# Patient Record
Sex: Female | Born: 1950 | Race: White | Hispanic: No | Marital: Married | Smoking: Never smoker
Health system: Southern US, Community
[De-identification: ages and names within clinical notes are randomized; demographics above are authoritative.]

## PROBLEM LIST (undated history)

## (undated) DIAGNOSIS — N1330 Unspecified hydronephrosis: Secondary | ICD-10-CM

## (undated) DIAGNOSIS — E669 Obesity, unspecified: Secondary | ICD-10-CM

## (undated) DIAGNOSIS — I10 Essential (primary) hypertension: Secondary | ICD-10-CM

## (undated) DIAGNOSIS — E785 Hyperlipidemia, unspecified: Secondary | ICD-10-CM

## (undated) DIAGNOSIS — I700 Atherosclerosis of aorta: Secondary | ICD-10-CM

## (undated) DIAGNOSIS — R0609 Other forms of dyspnea: Secondary | ICD-10-CM

## (undated) DIAGNOSIS — Z8619 Personal history of other infectious and parasitic diseases: Secondary | ICD-10-CM

## (undated) DIAGNOSIS — N200 Calculus of kidney: Secondary | ICD-10-CM

## (undated) DIAGNOSIS — G40309 Generalized idiopathic epilepsy and epileptic syndromes, not intractable, without status epilepticus: Secondary | ICD-10-CM

## (undated) DIAGNOSIS — O99019 Anemia complicating pregnancy, unspecified trimester: Secondary | ICD-10-CM

## (undated) DIAGNOSIS — J449 Chronic obstructive pulmonary disease, unspecified: Secondary | ICD-10-CM

## (undated) DIAGNOSIS — E1169 Type 2 diabetes mellitus with other specified complication: Secondary | ICD-10-CM

## (undated) DIAGNOSIS — E119 Type 2 diabetes mellitus without complications: Secondary | ICD-10-CM

## (undated) DIAGNOSIS — G4730 Sleep apnea, unspecified: Secondary | ICD-10-CM

## (undated) DIAGNOSIS — M1611 Unilateral primary osteoarthritis, right hip: Secondary | ICD-10-CM

## (undated) DIAGNOSIS — E039 Hypothyroidism, unspecified: Secondary | ICD-10-CM

## (undated) DIAGNOSIS — R569 Unspecified convulsions: Secondary | ICD-10-CM

## (undated) DIAGNOSIS — E7800 Pure hypercholesterolemia, unspecified: Secondary | ICD-10-CM

## (undated) DIAGNOSIS — D649 Anemia, unspecified: Secondary | ICD-10-CM

## (undated) DIAGNOSIS — J45909 Unspecified asthma, uncomplicated: Secondary | ICD-10-CM

## (undated) DIAGNOSIS — R310 Gross hematuria: Secondary | ICD-10-CM

## (undated) DIAGNOSIS — IMO0001 Reserved for inherently not codable concepts without codable children: Secondary | ICD-10-CM

## (undated) DIAGNOSIS — N182 Chronic kidney disease, stage 2 (mild): Secondary | ICD-10-CM

## (undated) HISTORY — PX: OTHER SURGICAL HISTORY: SHX169

## (undated) HISTORY — DX: Unspecified hydronephrosis: N13.30

## (undated) HISTORY — PX: TUBAL LIGATION: SHX77

## (undated) HISTORY — PX: COLONOSCOPY: SHX174

## (undated) HISTORY — PX: SHOULDER ARTHROSCOPY DISTAL CLAVICLE EXCISION AND OPEN ROTATOR CUFF REPAIR: SHX2396

## (undated) HISTORY — DX: Gross hematuria: R31.0

---
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
## 2006-05-08 ENCOUNTER — Emergency Department: Payer: Self-pay | Admitting: Unknown Physician Specialty

## 2006-05-08 ENCOUNTER — Other Ambulatory Visit: Payer: Self-pay

## 2006-05-09 ENCOUNTER — Ambulatory Visit: Payer: Self-pay | Admitting: Unknown Physician Specialty

## 2006-08-26 ENCOUNTER — Ambulatory Visit: Payer: Self-pay | Admitting: Gastroenterology

## 2008-02-11 ENCOUNTER — Ambulatory Visit: Payer: Self-pay | Admitting: Gastroenterology

## 2008-02-22 ENCOUNTER — Ambulatory Visit: Payer: Self-pay | Admitting: Internal Medicine

## 2008-05-17 ENCOUNTER — Other Ambulatory Visit: Payer: Self-pay

## 2008-05-17 ENCOUNTER — Observation Stay: Payer: Self-pay | Admitting: Internal Medicine

## 2009-02-28 ENCOUNTER — Emergency Department: Payer: Self-pay | Admitting: Emergency Medicine

## 2009-04-07 ENCOUNTER — Ambulatory Visit: Payer: Self-pay | Admitting: Internal Medicine

## 2009-07-13 ENCOUNTER — Inpatient Hospital Stay: Payer: Self-pay | Admitting: Internal Medicine

## 2009-09-11 ENCOUNTER — Ambulatory Visit: Payer: Self-pay

## 2009-09-14 ENCOUNTER — Ambulatory Visit: Payer: Self-pay | Admitting: Unknown Physician Specialty

## 2009-09-15 ENCOUNTER — Ambulatory Visit: Payer: Self-pay | Admitting: Unknown Physician Specialty

## 2009-10-25 ENCOUNTER — Emergency Department: Payer: Self-pay | Admitting: Emergency Medicine

## 2010-04-17 ENCOUNTER — Ambulatory Visit: Payer: Self-pay | Admitting: Internal Medicine

## 2011-03-05 ENCOUNTER — Ambulatory Visit: Payer: Self-pay | Admitting: Gastroenterology

## 2011-04-22 ENCOUNTER — Ambulatory Visit: Payer: Self-pay | Admitting: Internal Medicine

## 2012-04-22 ENCOUNTER — Ambulatory Visit: Payer: Self-pay | Admitting: Internal Medicine

## 2012-09-19 ENCOUNTER — Emergency Department (HOSPITAL_COMMUNITY)
Admission: EM | Admit: 2012-09-19 | Discharge: 2012-09-19 | Disposition: A | Discharge status: Home / Self Care | Payer: 59 | Attending: Emergency Medicine | Admitting: Emergency Medicine

## 2012-09-19 ENCOUNTER — Emergency Department (HOSPITAL_COMMUNITY): Payer: 59

## 2012-09-19 ENCOUNTER — Encounter (HOSPITAL_COMMUNITY): Payer: Self-pay | Admitting: Emergency Medicine

## 2012-09-19 DIAGNOSIS — R55 Syncope and collapse: Secondary | ICD-10-CM | POA: Insufficient documentation

## 2012-09-19 DIAGNOSIS — R0602 Shortness of breath: Secondary | ICD-10-CM | POA: Insufficient documentation

## 2012-09-19 DIAGNOSIS — R000 Tachycardia, unspecified: Secondary | ICD-10-CM | POA: Insufficient documentation

## 2012-09-19 DIAGNOSIS — I10 Essential (primary) hypertension: Secondary | ICD-10-CM | POA: Insufficient documentation

## 2012-09-19 DIAGNOSIS — R42 Dizziness and giddiness: Secondary | ICD-10-CM | POA: Insufficient documentation

## 2012-09-19 DIAGNOSIS — E860 Dehydration: Secondary | ICD-10-CM | POA: Insufficient documentation

## 2012-09-19 DIAGNOSIS — N390 Urinary tract infection, site not specified: Secondary | ICD-10-CM | POA: Insufficient documentation

## 2012-09-19 HISTORY — DX: Essential (primary) hypertension: I10

## 2012-09-19 HISTORY — DX: Unspecified convulsions: R56.9

## 2012-09-19 HISTORY — DX: Chronic obstructive pulmonary disease, unspecified: J44.9

## 2012-09-19 LAB — URINALYSIS, ROUTINE W REFLEX MICROSCOPIC
Glucose, UA: 100 mg/dL — AB
Nitrite: NEGATIVE
Specific Gravity, Urine: 1.014 (ref 1.005–1.030)
pH: 5 (ref 5.0–8.0)

## 2012-09-19 LAB — BASIC METABOLIC PANEL
BUN: 13 mg/dL (ref 6–23)
CO2: 22 mEq/L (ref 19–32)
Chloride: 96 mEq/L (ref 96–112)
Creatinine, Ser: 0.76 mg/dL (ref 0.50–1.10)
GFR calc Af Amer: >90 mL/min (ref >90)
Potassium: 4 mEq/L (ref 3.5–5.1)

## 2012-09-19 LAB — CBC
HCT: 41 % (ref 36.0–46.0)
Hemoglobin: 13.9 g/dL (ref 12.0–15.0)
MCV: 89.5 fL (ref 78.0–100.0)
RBC: 4.58 MIL/uL (ref 3.87–5.11)
RDW: 14.5 % (ref 11.5–15.5)
WBC: 7.7 10*3/uL (ref 4.0–10.5)

## 2012-09-19 LAB — URINE MICROSCOPIC-ADD ON

## 2012-09-19 MED ORDER — SODIUM CHLORIDE 0.9 % IV BOLUS (SEPSIS)
1000.0000 mL | Freq: Once | INTRAVENOUS | Status: AC
  Administered 2012-09-19: 1000 mL via INTRAVENOUS

## 2012-09-19 MED ORDER — CEPHALEXIN 500 MG PO CAPS: 500.0000 mg | ORAL_CAPSULE | Freq: Four times a day (QID) | ORAL | Status: DC

## 2012-09-19 MED ORDER — CEPHALEXIN 250 MG PO CAPS
500.0000 mg | ORAL_CAPSULE | Freq: Once | ORAL | Status: AC
  Administered 2012-09-19: 500 mg via ORAL
  Filled 2012-09-19: qty 2

## 2012-09-19 NOTE — ED Notes (Signed)
Pt stating she feels "alot better now".

## 2012-09-19 NOTE — ED Notes (Signed)
Patient presented to ED via EMS after having 2 syncopal episodes at a marching band event outside. EMS states witnesses saw patient have loss of consciousness for approx 30 seconds each time.   EMS placed 20g ac to left ac with NS infusing.

## 2012-09-19 NOTE — ED Provider Notes (Signed)
History     CSN: [id number]  Arrival date & time 09/19/12  1813   First MD Initiated Contact with Patient 09/19/12 1822      Chief Complaint  Patient presents with  . Loss of Consciousness    (Consider location/radiation/quality/duration/timing/severity/associated sxs/prior treatment) HPI Comments: 61 y/o female presents to the ED by EMS with her husband after having two syncopal episodes while outside watching her daughter-in-law's marching band event. Patient states she was outside watching for about an hour and a half in the heat when she began feeling "woozy", lightheaded, and nauseated followed by the field becoming "gray". Husband states she then "passed out" into him while sitting up. She never fell or hit her head. Husband says her eyes were closed and she was out for about 15 minutes. EMS arrived and tried giving her some gatorade but she only drank a little. Once EMS put her on the stretcher she passed out again for about 5 minutes. When she woke up she felt lightheaded and nauseous. Currently she is lightheaded when standing but not nauseated. Admits to associated sob on exertion since arriving in ED. She is feeling fatigued and weak. Admits to not drinking as much water as she should. Denies chest pain, abdominal pain, dizziness, leg swelling. No recent medication changes. When she woke up this morning she felt completely fine.   The history is provided by the patient and the spouse.    Past Medical History  Diagnosis Date  . COPD (chronic obstructive pulmonary disease)   . Diabetes mellitus   . Seizures   . Hypertension     History reviewed. No pertinent past surgical history.  History reviewed. No pertinent family history.  History  Substance Use Topics  . Smoking status: Not on file  . Smokeless tobacco: Not on file  . Alcohol Use: No    OB History    Grav Para Term Preterm Abortions TAB SAB Ect Mult Living                  Review of Systems    Constitutional: Positive for chills and fatigue. Negative for fever, diaphoresis and appetite change.  HENT: Negative for neck pain and neck stiffness.   Eyes: Positive for visual disturbance.  Respiratory: Positive for shortness of breath.   Cardiovascular: Negative for chest pain and leg swelling.  Gastrointestinal: Positive for nausea. Negative for vomiting and abdominal pain.  Genitourinary: Negative for dysuria, urgency and frequency.  Musculoskeletal: Negative for back pain.  Skin: Positive for pallor.  Neurological: Positive for syncope, weakness and light-headedness. Negative for dizziness and speech difficulty.  Psychiatric/Behavioral: Negative for confusion. The patient is not nervous/anxious.     Allergies  Dilantin  Home Medications   Current Outpatient Rx  Name Route Sig Dispense Refill  . ATORVASTATIN CALCIUM 40 MG PO TABS Oral Take 40 mg by mouth daily.    Marland Kitchen DIVALPROEX SODIUM 250 MG PO TBEC Oral Take 250 mg by mouth 3 (three) times daily.    Marland Kitchen LISINOPRIL 10 MG PO TABS Oral Take 10 mg by mouth daily.      BP 164/57  Pulse 133  Temp 97.6 F (36.4 C) (Oral)  Resp 18  SpO2 95%  Physical Exam  Constitutional: She is oriented to person, place, and time. She appears well-developed and well-nourished. She is active. No distress.  HENT:  Head: Normocephalic and atraumatic.  Mouth/Throat: Uvula is midline, oropharynx is clear and moist and mucous membranes are normal.  Eyes: Conjunctivae  normal and EOM are normal. Pupils are equal, round, and reactive to light.  Neck: Normal range of motion. Neck supple. No Brudzinski's sign and no Kernig's sign noted.  Cardiovascular: Regular rhythm, normal heart sounds and intact distal pulses.  Tachycardia present.   No murmur heard.      No extremity edema  Pulmonary/Chest: Effort normal and breath sounds normal. She has no wheezes. She has no rhonchi.  Abdominal: Soft. Normal appearance and bowel sounds are normal. There is no  tenderness.  Musculoskeletal: Normal range of motion.  Neurological: She is alert and oriented to person, place, and time. She has normal strength. No cranial nerve deficit or sensory deficit. Gait (unsteady on feet- feels lightheaded) abnormal.       Point-to-point discrimination intact  Skin: Skin is warm, dry and intact. No rash noted. She is not diaphoretic. No pallor.  Psychiatric: She has a normal mood and affect. Her speech is normal and behavior is normal. Thought content normal.    ED Course  Procedures (including critical care time)   Labs Reviewed  CBC  BASIC METABOLIC PANEL  URINALYSIS, ROUTINE W REFLEX MICROSCOPIC  URINE CULTURE    Date: 09/19/2012  Rate: 119  Rhythm: sinus tachycardia  QRS Axis: normal  Intervals: normal  ST/T Wave abnormalities: nonspecific T wave changes borderline T wave abnormalities  Conduction Disutrbances:none  Narrative Interpretation: no stemi, baseline wander in lead III  Old EKG Reviewed: none available  Results for orders placed during the hospital encounter of 09/19/12  CBC      Component Value Range   WBC 7.7  4.0 - 10.5 K/uL   RBC 4.58  3.87 - 5.11 MIL/uL   Hemoglobin 13.9  12.0 - 15.0 g/dL   HCT 40.9  81.1 - 91.4 %   MCV 89.5  78.0 - 100.0 fL   MCH 30.3  26.0 - 34.0 pg   MCHC 33.9  30.0 - 36.0 g/dL   RDW 78.2  95.6 - 21.3 %   Platelets 185  150 - 400 K/uL  BASIC METABOLIC PANEL      Component Value Range   Sodium 132 (*) 135 - 145 mEq/L   Potassium 4.0  3.5 - 5.1 mEq/L   Chloride 96  96 - 112 mEq/L   CO2 22  19 - 32 mEq/L   Glucose, Bld 160 (*) 70 - 99 mg/dL   BUN 13  6 - 23 mg/dL   Creatinine, Ser 0.86  0.50 - 1.10 mg/dL   Calcium 9.2  8.4 - 57.8 mg/dL   GFR calc non Af Amer 89 (*) >90 mL/min   GFR calc Af Amer >90  >90 mL/min  URINALYSIS, ROUTINE W REFLEX MICROSCOPIC      Component Value Range   Color, Urine YELLOW  YELLOW   APPearance CLOUDY (*) CLEAR   Specific Gravity, Urine 1.014  1.005 - 1.030   pH 5.0  5.0  - 8.0   Glucose, UA 100 (*) NEGATIVE mg/dL   Hgb urine dipstick NEGATIVE  NEGATIVE   Bilirubin Urine SMALL (*) NEGATIVE   Ketones, ur 40 (*) NEGATIVE mg/dL   Protein, ur NEGATIVE  NEGATIVE mg/dL   Urobilinogen, UA 1.0  0.0 - 1.0 mg/dL   Nitrite NEGATIVE  NEGATIVE   Leukocytes, UA MODERATE (*) NEGATIVE  URINE MICROSCOPIC-ADD ON      Component Value Range   Squamous Epithelial / LPF FEW (*) RARE   WBC, UA 21-50  <3 WBC/hpf   Bacteria, UA FEW (*)  RARE   Casts HYALINE CASTS (*) NEGATIVE    Dg Chest 2 View  09/19/2012  *RADIOLOGY REPORT*  Clinical Data: Loss of consciousness.  Shortness of breath.  CHEST - 2 VIEW  Comparison: None.  Findings: Normal sized heart.  Mildly elevated left hemidiaphragm. Possible hiatal hernia.  Clear lungs.  Mild diffuse peribronchial thickening.  Mild thoracic and lumbar spine degenerative changes.  IMPRESSION:  1.  Mild bronchitic changes. 2.  Mildly elevated left hemidiaphragm. 3.  Probable hiatal hernia.   Original Report Authenticated By: Darrol Angel, M.D.      1. Syncope   2. Dehydration   3. Urinary tract infection       MDM  62 year old female with 2 episodes of syncope. Patient was dehydrated and orthostatic. Fluids were given and orthostatics are back to normal. She is feeling fine. Able to ambulate without difficulty. She has a urinary tract infection with Keflex has been started, and she'll be discharged with the rest of this antibiotic. Case discussed with Dr. Silverio Lay who also evaluated patient and agrees with plan of care.        Trevor Mace, PA-C 09/19/12 2216

## 2012-09-21 LAB — URINE CULTURE: Colony Count: 15000

## 2012-09-21 NOTE — ED Provider Notes (Signed)
Medical screening examination/treatment/procedure(s) were performed by non-physician practitioner and as supervising physician I was immediately available for consultation/collaboration.   Felcia Huebert H Pavel Gadd, MD 09/21/12 1800 

## 2013-03-05 ENCOUNTER — Emergency Department: Payer: Self-pay | Admitting: Emergency Medicine

## 2013-03-05 LAB — BASIC METABOLIC PANEL
BUN: 16 mg/dL (ref 7–18)
Calcium, Total: 8.1 mg/dL — ABNORMAL LOW (ref 8.5–10.1)
Chloride: 103 mmol/L (ref 98–107)
Co2: 21 mmol/L (ref 21–32)
EGFR (African American): >60
Osmolality: 281 (ref 275–301)
Potassium: 4.3 mmol/L (ref 3.5–5.1)
Sodium: 138 mmol/L (ref 136–145)

## 2013-03-05 LAB — URINALYSIS, COMPLETE
Bilirubin,UR: NEGATIVE
Glucose,UR: 50 mg/dL (ref 0–75)
Ph: 5 (ref 4.5–8.0)
Protein: 100
RBC,UR: 4567 /HPF (ref 0–5)
Specific Gravity: 1.026 (ref 1.003–1.030)
WBC UR: 69 /HPF (ref 0–5)

## 2013-03-05 LAB — CBC
MCH: 30.6 pg (ref 26.0–34.0)
MCHC: 34.3 g/dL (ref 32.0–36.0)
MCV: 89 fL (ref 80–100)
RBC: 4.51 10*6/uL (ref 3.80–5.20)
WBC: 9.1 10*3/uL (ref 3.6–11.0)

## 2013-03-16 ENCOUNTER — Ambulatory Visit: Payer: Self-pay

## 2013-05-25 ENCOUNTER — Ambulatory Visit: Payer: Self-pay | Admitting: Internal Medicine

## 2014-05-15 DIAGNOSIS — E785 Hyperlipidemia, unspecified: Secondary | ICD-10-CM | POA: Insufficient documentation

## 2014-05-15 DIAGNOSIS — J45909 Unspecified asthma, uncomplicated: Secondary | ICD-10-CM | POA: Insufficient documentation

## 2014-05-15 DIAGNOSIS — N182 Chronic kidney disease, stage 2 (mild): Secondary | ICD-10-CM

## 2014-05-15 DIAGNOSIS — G40909 Epilepsy, unspecified, not intractable, without status epilepticus: Secondary | ICD-10-CM | POA: Insufficient documentation

## 2014-05-15 DIAGNOSIS — E1122 Type 2 diabetes mellitus with diabetic chronic kidney disease: Secondary | ICD-10-CM | POA: Insufficient documentation

## 2014-05-15 DIAGNOSIS — R569 Unspecified convulsions: Secondary | ICD-10-CM | POA: Insufficient documentation

## 2014-05-15 DIAGNOSIS — E119 Type 2 diabetes mellitus without complications: Secondary | ICD-10-CM | POA: Insufficient documentation

## 2014-05-15 DIAGNOSIS — I10 Essential (primary) hypertension: Secondary | ICD-10-CM | POA: Insufficient documentation

## 2014-06-16 ENCOUNTER — Ambulatory Visit: Payer: Self-pay | Admitting: Internal Medicine

## 2014-06-23 ENCOUNTER — Ambulatory Visit: Payer: Self-pay | Admitting: Internal Medicine

## 2014-09-25 DIAGNOSIS — IMO0002 Reserved for concepts with insufficient information to code with codable children: Secondary | ICD-10-CM | POA: Insufficient documentation

## 2014-09-25 DIAGNOSIS — Z6835 Body mass index (BMI) 35.0-35.9, adult: Secondary | ICD-10-CM

## 2014-09-27 DIAGNOSIS — E7800 Pure hypercholesterolemia, unspecified: Secondary | ICD-10-CM | POA: Insufficient documentation

## 2014-12-04 ENCOUNTER — Emergency Department: Payer: Self-pay | Admitting: Emergency Medicine

## 2014-12-04 LAB — BASIC METABOLIC PANEL
ANION GAP: 7 (ref 7–16)
BUN: 13 mg/dL (ref 7–18)
CO2: 27 mmol/L (ref 21–32)
CREATININE: 0.85 mg/dL (ref 0.60–1.30)
Calcium, Total: 8.7 mg/dL (ref 8.5–10.1)
Chloride: 104 mmol/L (ref 98–107)
Glucose: 208 mg/dL — ABNORMAL HIGH (ref 65–99)
Osmolality: 282 (ref 275–301)
Potassium: 3.7 mmol/L (ref 3.5–5.1)
Sodium: 138 mmol/L (ref 136–145)

## 2014-12-04 LAB — CBC
HCT: 42.9 % (ref 35.0–47.0)
HGB: 13.6 g/dL (ref 12.0–16.0)
MCH: 29.3 pg (ref 26.0–34.0)
MCHC: 31.8 g/dL — AB (ref 32.0–36.0)
MCV: 92 fL (ref 80–100)
Platelet: 200 10*3/uL (ref 150–440)
RBC: 4.65 10*6/uL (ref 3.80–5.20)
RDW: 14.5 % (ref 11.5–14.5)
WBC: 6.4 10*3/uL (ref 3.6–11.0)

## 2014-12-04 LAB — TROPONIN I: Troponin-I: <0.02 ng/mL

## 2015-03-11 ENCOUNTER — Emergency Department: Payer: Self-pay | Admitting: Emergency Medicine

## 2015-03-11 HISTORY — PX: CYSTOSCOPY W/ URETERAL STENT PLACEMENT: SHX1429

## 2015-03-11 LAB — URINALYSIS, COMPLETE
Bacteria: NONE SEEN
Bilirubin,UR: NEGATIVE
Glucose,UR: >=500 mg/dL (ref 0–75)
Leukocyte Esterase: NEGATIVE
Nitrite: NEGATIVE
PH: 5 (ref 4.5–8.0)
Protein: NEGATIVE
RBC,UR: 208 /HPF (ref 0–5)
Specific Gravity: 1.028 (ref 1.003–1.030)
Squamous Epithelial: NONE SEEN
WBC UR: 2 /HPF (ref 0–5)

## 2015-03-11 LAB — CBC WITH DIFFERENTIAL/PLATELET
BASOS PCT: 0.4 %
Basophil #: 0 10*3/uL (ref 0.0–0.1)
EOS ABS: 0 10*3/uL (ref 0.0–0.7)
Eosinophil %: 0 %
HCT: 43.3 % (ref 35.0–47.0)
HGB: 14.3 g/dL (ref 12.0–16.0)
LYMPHS ABS: 1.3 10*3/uL (ref 1.0–3.6)
Lymphocyte %: 10.5 %
MCH: 29.9 pg (ref 26.0–34.0)
MCHC: 33 g/dL (ref 32.0–36.0)
MCV: 91 fL (ref 80–100)
Monocyte #: 0.6 x10 3/mm (ref 0.2–0.9)
Monocyte %: 5.1 %
Neutrophil #: 10.3 10*3/uL — ABNORMAL HIGH (ref 1.4–6.5)
Neutrophil %: 84 %
Platelet: 224 10*3/uL (ref 150–440)
RBC: 4.79 10*6/uL (ref 3.80–5.20)
RDW: 14.7 % — ABNORMAL HIGH (ref 11.5–14.5)
WBC: 12.2 10*3/uL — ABNORMAL HIGH (ref 3.6–11.0)

## 2015-03-11 LAB — BASIC METABOLIC PANEL
Anion Gap: 9 (ref 7–16)
BUN: 17 mg/dL
CHLORIDE: 98 mmol/L — AB
CREATININE: 0.89 mg/dL
Calcium, Total: 9 mg/dL
Co2: 28 mmol/L
EGFR (African American): >60
EGFR (Non-African Amer.): >60
Glucose: 254 mg/dL — ABNORMAL HIGH
POTASSIUM: 4.5 mmol/L
SODIUM: 135 mmol/L

## 2015-03-13 ENCOUNTER — Other Ambulatory Visit: Payer: Self-pay | Admitting: Urology

## 2015-03-13 NOTE — Patient Instructions (Addendum)
Crystal Foley  03/13/2015   Your procedure is scheduled on: 03/17/15   Report to [facility] Main  Entrance and follow signs to               [name] at 9:45 AM.   Call this number if you have problems the morning of surgery [phone]   Remember:  Do not eat food or drink liquids :After Midnight.    Take these medicines the morning of surgery with A SIP OF WATER: LIPITOR / DEPAKOTE / MAY USE ALBUTEROL INHALER / MAY USE PERCOCET ,PYRIDIUM, PHENERGAN IF NEEDED                               You may not have any metal on your body including hair pins and              piercings  Do not wear jewelry, make-up, lotions, powders or perfumes.             Do not wear nail polish.  Do not shave  48 hours prior to surgery.              Men may shave face and neck.   Do not bring valuables to the hospital. [name].  Contacts, dentures or bridgework may not be worn into surgery.  Leave suitcase in the car. After surgery it may be brought to your room.     Patients discharged the day of surgery will not be allowed to drive home.  Name and phone number of your driver:  Special Instructions: N/A              Please read over the following fact sheets you were given: _____________________________________________________________________                                                     [name]  Before surgery, you can play an important role.  Because skin is not sterile, your skin needs to be as free of germs as possible.  You can reduce the number of germs on your skin by washing with CHG (chlorahexidine gluconate) soap before surgery.  CHG is an antiseptic cleaner which kills germs and bonds with the skin to continue killing germs even after washing. Please DO NOT use if you have an allergy to CHG or antibacterial soaps.  If your skin becomes reddened/irritated stop using the CHG  and inform your nurse when you arrive at Short Stay. Do not shave (including legs and underarms) for at least 48 hours prior to the first CHG shower.  You may shave your face. Please follow these instructions carefully:   1.  Shower with CHG Soap the night before surgery and the  morning of Surgery.   2.  If you choose to wash your hair, wash your hair first as usual with your  normal  Shampoo.   3.  After you shampoo, rinse your hair and body thoroughly to remove the  shampoo.  4.  Use CHG as you would any other liquid soap.  You can apply chg directly  to the skin and wash . Gently wash with scrungie or clean wascloth    5.  Apply the CHG Soap to your body ONLY FROM THE NECK DOWN.   Do not use on open                           Wound or open sores. Avoid contact with eyes, ears mouth and genitals (private parts).                        Genitals (private parts) with your normal soap.              6.  Wash thoroughly, paying special attention to the area where your surgery  will be performed.   7.  Thoroughly rinse your body with warm water from the neck down.   8.  DO NOT shower/wash with your normal soap after using and rinsing off  the CHG Soap .                9.  Pat yourself dry with a clean towel.             10.  Wear clean pajamas.             11.  Place clean sheets on your bed the night of your first shower and do not  sleep with pets.  Day of Surgery : Do not apply any lotions/deodorants the morning of surgery.  Please wear clean clothes to the hospital/surgery center.  FAILURE TO FOLLOW THESE INSTRUCTIONS MAY RESULT IN THE CANCELLATION OF YOUR SURGERY    PATIENT SIGNATURE_________________________________  ______________________________________________________________________

## 2015-03-13 NOTE — Progress Notes (Signed)
Called requested  orders be released in Epic surgery 03-17-15 pre op 03-14-15 Thanks

## 2015-03-14 ENCOUNTER — Encounter (HOSPITAL_COMMUNITY)
Admission: RE | Admit: 2015-03-14 | Discharge: 2015-03-14 | Disposition: A | Discharge status: Home / Self Care | Payer: 59 | Source: Ambulatory Visit | Attending: Urology | Admitting: Urology

## 2015-03-14 ENCOUNTER — Encounter (HOSPITAL_COMMUNITY): Payer: Self-pay

## 2015-03-14 DIAGNOSIS — E780 Pure hypercholesterolemia: Secondary | ICD-10-CM | POA: Diagnosis not present

## 2015-03-14 DIAGNOSIS — J449 Chronic obstructive pulmonary disease, unspecified: Secondary | ICD-10-CM | POA: Diagnosis not present

## 2015-03-14 DIAGNOSIS — E119 Type 2 diabetes mellitus without complications: Secondary | ICD-10-CM | POA: Diagnosis not present

## 2015-03-14 DIAGNOSIS — N201 Calculus of ureter: Secondary | ICD-10-CM | POA: Diagnosis present

## 2015-03-14 DIAGNOSIS — I10 Essential (primary) hypertension: Secondary | ICD-10-CM | POA: Diagnosis not present

## 2015-03-14 DIAGNOSIS — R569 Unspecified convulsions: Secondary | ICD-10-CM | POA: Diagnosis not present

## 2015-03-14 HISTORY — DX: Reserved for inherently not codable concepts without codable children: IMO0001

## 2015-03-14 HISTORY — DX: Calculus of kidney: N20.0

## 2015-03-14 LAB — BASIC METABOLIC PANEL
Anion gap: 9 (ref 5–15)
BUN: 11 mg/dL (ref 6–23)
CO2: 28 mmol/L (ref 19–32)
Calcium: 8.5 mg/dL (ref 8.4–10.5)
Chloride: 101 mmol/L (ref 96–112)
Creatinine, Ser: 0.69 mg/dL (ref 0.50–1.10)
GFR calc Af Amer: >90 mL/min (ref >90)
GFR calc non Af Amer: >90 mL/min (ref >90)
GLUCOSE: 179 mg/dL — AB (ref 70–99)
Potassium: 3.7 mmol/L (ref 3.5–5.1)
Sodium: 138 mmol/L (ref 135–145)

## 2015-03-14 LAB — CBC
HEMATOCRIT: 39.9 % (ref 36.0–46.0)
Hemoglobin: 13.1 g/dL (ref 12.0–15.0)
MCH: 29.9 pg (ref 26.0–34.0)
MCHC: 32.8 g/dL (ref 30.0–36.0)
MCV: 91.1 fL (ref 78.0–100.0)
PLATELETS: 203 10*3/uL (ref 150–400)
RBC: 4.38 MIL/uL (ref 3.87–5.11)
RDW: 13.5 % (ref 11.5–15.5)
WBC: 6.5 10*3/uL (ref 4.0–10.5)

## 2015-03-16 NOTE — H&P (Signed)
NAMEBITANIA, SHANKLAND                ACCOUNT NO.:  [id number]  MEDICAL RECORD NO.:  [id number]  LOCATION:                                 FACILITY:  PHYSICIAN:  Marshall Cork. Jeffie Pollock, M.D.    DATE OF BIRTH:  [birth year]  DATE OF ADMISSION:  03/17/2015 DATE OF DISCHARGE:                             HISTORY & PHYSICAL   CHIEF COMPLAINT:  Left flank pain.  HISTORY:  Ms. Zavaleta is a 64 year old white female who I saw at [facility] on March 12, 2015, for a 9 mm left UPJ stone with intractable pain and vomiting.  She underwent placement of a left ureteral stent and has elected ureteroscopy for subsequent therapy.  We elected to bring her to [facility] since I would be able to get her in to the OR sooner than if she had done it at [facility].  Her past urologic history is significant for 3 prior stones which she passed. She has had a prior UTI and she has had a prior bladder tack.  REVIEW OF SYSTEMS:  On her prior admission, she had left flank pain, nausea, vomiting, and increased urinary incontinence with frequency, but no fever or chills.  She denied shortness of breath, chest pain, headache, swelling of the legs, and was otherwise without complaints on a 12-point review of systems.  PAST MEDICAL HISTORY:  Pertinent for allergies to Dilantin.  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  She denies coronary artery disease.  MEDICATIONS:  Current medications include divalproex 250 mg, 3 in the morning, 2 in the evening; metformin 500 mg daily; losartan 50 mg daily; glimepiride 2 mg daily; atorvastatin 40 mg daily.  PAST SURGICAL HISTORY:  Significant for the bladder tack, close reduction of the right arm, tubal ligation, 2 vaginal deliveries, and cysto with left stent insertion.  FAMILY HISTORY:  Pertinent for coronary artery disease, emphysema, and brain cancer.  SOCIAL HISTORY:   Negative tobacco or alcohol.  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  She is married.  PHYSICAL EXAMINATION:  VITAL SIGNS:  Her temp is 98, pulse 97, respirations 16, blood pressure is 162/86. GENERAL:  She is a well-developed, somewhat obese, white female, in no acute distress.  Alert and oriented x3. HEAD AND FACE:  Normocephalic, atraumatic. NECK:  Supple without thyromegaly or bruits. LUNGS:  Clear.  Normal effort. HEART:  Regular rate and rhythm. ABDOMEN:  Soft, obese with left upper quadrant and left CVA tenderness. No mass, hepatosplenomegaly, or hernias were noted. EXTREMITIES:  Full range of motion without edema. SKIN:  Warm and dry. NEUROLOGIC:  She has no focal defects. PSYCHIATRIC:  She has normal mood and affect.  IMPRESSION:  A 9 mm left proximal/ureteropelvic junction stone with recent stent placement.  PLAN:  She is to undergo left ureteroscopy, lasertripsy, and stone removal with possible stent placement.     Marshall Cork. Jeffie Pollock, M.D.     JJW/MEDQ  D:  03/15/2015  T:  03/16/2015  Job:  [id number]

## 2015-03-17 ENCOUNTER — Ambulatory Visit (HOSPITAL_COMMUNITY): Payer: 59 | Admitting: Anesthesiology

## 2015-03-17 ENCOUNTER — Encounter (HOSPITAL_COMMUNITY): Payer: Self-pay | Admitting: *Deleted

## 2015-03-17 ENCOUNTER — Encounter (HOSPITAL_COMMUNITY)
Admission: RE | Disposition: A | Discharge status: Home / Self Care | Payer: Self-pay | Source: Ambulatory Visit | Attending: Urology

## 2015-03-17 ENCOUNTER — Ambulatory Visit (HOSPITAL_COMMUNITY)
Admission: RE | Admit: 2015-03-17 | Discharge: 2015-03-17 | Disposition: A | Discharge status: Home / Self Care | Payer: 59 | Source: Ambulatory Visit | Attending: Urology | Admitting: Urology

## 2015-03-17 DIAGNOSIS — N201 Calculus of ureter: Secondary | ICD-10-CM | POA: Insufficient documentation

## 2015-03-17 DIAGNOSIS — I10 Essential (primary) hypertension: Secondary | ICD-10-CM | POA: Insufficient documentation

## 2015-03-17 DIAGNOSIS — J449 Chronic obstructive pulmonary disease, unspecified: Secondary | ICD-10-CM | POA: Insufficient documentation

## 2015-03-17 DIAGNOSIS — E780 Pure hypercholesterolemia: Secondary | ICD-10-CM | POA: Insufficient documentation

## 2015-03-17 DIAGNOSIS — R569 Unspecified convulsions: Secondary | ICD-10-CM | POA: Insufficient documentation

## 2015-03-17 DIAGNOSIS — E119 Type 2 diabetes mellitus without complications: Secondary | ICD-10-CM | POA: Insufficient documentation

## 2015-03-17 HISTORY — PX: CYSTOSCOPY WITH RETROGRADE PYELOGRAM, URETEROSCOPY AND STENT PLACEMENT: SHX5789

## 2015-03-17 LAB — GLUCOSE, CAPILLARY
Glucose-Capillary: 198 mg/dL — ABNORMAL HIGH (ref 70–99)
Glucose-Capillary: 192 mg/dL — ABNORMAL HIGH (ref 70–99)

## 2015-03-17 SURGERY — CYSTOURETEROSCOPY, WITH RETROGRADE PYELOGRAM AND STENT INSERTION
Anesthesia: General | Laterality: Left

## 2015-03-17 MED ORDER — MIDAZOLAM HCL 2 MG/2ML IJ SOLN
INTRAMUSCULAR | Status: AC
  Filled 2015-03-17: qty 2

## 2015-03-17 MED ORDER — PROPOFOL 10 MG/ML IV BOLUS
INTRAVENOUS | Status: AC
  Filled 2015-03-17: qty 20

## 2015-03-17 MED ORDER — PROPOFOL 10 MG/ML IV BOLUS
INTRAVENOUS | Status: DC | PRN
  Administered 2015-03-17: 180 mg via INTRAVENOUS

## 2015-03-17 MED ORDER — ONDANSETRON HCL 4 MG/2ML IJ SOLN
INTRAMUSCULAR | Status: DC | PRN
  Administered 2015-03-17: 4 mg via INTRAVENOUS

## 2015-03-17 MED ORDER — ACETAMINOPHEN 325 MG PO TABS: 650.0000 mg | ORAL_TABLET | ORAL | Status: DC | PRN

## 2015-03-17 MED ORDER — FENTANYL CITRATE 0.05 MG/ML IJ SOLN: 25.0000 ug | INTRAMUSCULAR | Status: DC | PRN

## 2015-03-17 MED ORDER — FENTANYL CITRATE 0.05 MG/ML IJ SOLN
INTRAMUSCULAR | Status: DC | PRN
  Administered 2015-03-17: 50 ug via INTRAVENOUS

## 2015-03-17 MED ORDER — SODIUM CHLORIDE 0.9 % IR SOLN
Status: DC | PRN
  Administered 2015-03-17: 4000 mL

## 2015-03-17 MED ORDER — SODIUM CHLORIDE 0.9 % IV SOLN: 250.0000 mL | INTRAVENOUS | Status: DC | PRN

## 2015-03-17 MED ORDER — MEPERIDINE HCL 50 MG/ML IJ SOLN: 6.2500 mg | INTRAMUSCULAR | Status: DC | PRN

## 2015-03-17 MED ORDER — LACTATED RINGERS IV SOLN: INTRAVENOUS | Status: DC

## 2015-03-17 MED ORDER — CIPROFLOXACIN IN D5W 400 MG/200ML IV SOLN: 400.0000 mg | INTRAVENOUS | Status: DC

## 2015-03-17 MED ORDER — MIDAZOLAM HCL 5 MG/5ML IJ SOLN
INTRAMUSCULAR | Status: DC | PRN
  Administered 2015-03-17: 2 mg via INTRAVENOUS

## 2015-03-17 MED ORDER — SODIUM CHLORIDE 0.9 % IJ SOLN: 3.0000 mL | INTRAMUSCULAR | Status: DC | PRN

## 2015-03-17 MED ORDER — CIPROFLOXACIN IN D5W 400 MG/200ML IV SOLN
INTRAVENOUS | Status: AC
  Filled 2015-03-17: qty 200

## 2015-03-17 MED ORDER — OXYCODONE HCL 5 MG PO TABS
5.0000 mg | ORAL_TABLET | ORAL | Status: DC | PRN
  Administered 2015-03-17: 5 mg via ORAL
  Filled 2015-03-17: qty 1

## 2015-03-17 MED ORDER — CIPROFLOXACIN IN D5W 400 MG/200ML IV SOLN
INTRAVENOUS | Status: DC | PRN
  Administered 2015-03-17: 400 mg via INTRAVENOUS

## 2015-03-17 MED ORDER — DEXAMETHASONE SODIUM PHOSPHATE 4 MG/ML IJ SOLN
INTRAMUSCULAR | Status: DC | PRN
  Administered 2015-03-17: 10 mg via INTRAVENOUS

## 2015-03-17 MED ORDER — PROMETHAZINE HCL 25 MG/ML IJ SOLN: 6.2500 mg | INTRAMUSCULAR | Status: DC | PRN

## 2015-03-17 MED ORDER — ACETAMINOPHEN 650 MG RE SUPP
650.0000 mg | RECTAL | Status: DC | PRN
  Filled 2015-03-17: qty 1

## 2015-03-17 MED ORDER — SODIUM CHLORIDE 0.9 % IJ SOLN: 3.0000 mL | Freq: Two times a day (BID) | INTRAMUSCULAR | Status: DC

## 2015-03-17 MED ORDER — LACTATED RINGERS IV SOLN
INTRAVENOUS | Status: DC
  Administered 2015-03-17: 1000 mL via INTRAVENOUS

## 2015-03-17 MED ORDER — FENTANYL CITRATE 0.05 MG/ML IJ SOLN
INTRAMUSCULAR | Status: AC
  Filled 2015-03-17: qty 2

## 2015-03-17 MED ORDER — IOHEXOL 300 MG/ML  SOLN
INTRAMUSCULAR | Status: DC | PRN
  Administered 2015-03-17: 1 mL

## 2015-03-17 MED ORDER — LIDOCAINE HCL (CARDIAC) 20 MG/ML IV SOLN
INTRAVENOUS | Status: DC | PRN
  Administered 2015-03-17: 100 mg via INTRAVENOUS

## 2015-03-17 SURGICAL SUPPLY — 17 items
BAG URO CATCHER STRL LF (DRAPE) ×2 IMPLANT
CATH URET 5FR 28IN OPEN ENDED (CATHETERS) ×2 IMPLANT
CLOTH BEACON ORANGE TIMEOUT ST (SAFETY) ×2 IMPLANT
EXTRACTOR STONE NITINOL NGAGE (UROLOGICAL SUPPLIES) ×2 IMPLANT
FIBER LASER FLEXIVA 1000 (UROLOGICAL SUPPLIES) IMPLANT
FIBER LASER FLEXIVA 200 (UROLOGICAL SUPPLIES) IMPLANT
FIBER LASER FLEXIVA 365 (UROLOGICAL SUPPLIES) IMPLANT
FIBER LASER FLEXIVA 550 (UROLOGICAL SUPPLIES) IMPLANT
FIBER LASER TRAC TIP (UROLOGICAL SUPPLIES) IMPLANT
GLOVE SURG SS PI 8.0 STRL IVOR (GLOVE) IMPLANT
GOWN STRL REUS W/TWL XL LVL3 (GOWN DISPOSABLE) ×2 IMPLANT
GUIDEWIRE STR DUAL SENSOR (WIRE) IMPLANT
MANIFOLD NEPTUNE II (INSTRUMENTS) ×2 IMPLANT
PACK CYSTO (CUSTOM PROCEDURE TRAY) ×2 IMPLANT
SHEATH ACCESS URETERAL 38CM (SHEATH) ×2 IMPLANT
SHIELD EYE BINOCULAR (MISCELLANEOUS) IMPLANT
TUBING CONNECTING 10 (TUBING) ×2 IMPLANT

## 2015-03-17 NOTE — Interval H&P Note (Signed)
History and Physical Interval Note:  She has done well with the stent and presents for definitive stone treatment.   03/17/2015 11:48 AM  Crystal Foley  has presented today for surgery, with the diagnosis of LEFT PROXIMAL STONE  The various methods of treatment have been discussed with the patient and family. After consideration of risks, benefits and other options for treatment, the patient has consented to  Procedure(s): LEFT URETEROSCOPY WITH LASER AND STENT PLACEMENT (Left) HOLMIUM LASER APPLICATION (Left) as a surgical intervention .  The patient's history has been reviewed, patient examined, no change in status, stable for surgery.  I have reviewed the patient's chart and labs.  Questions were answered to the patient's satisfaction.     [name] J

## 2015-03-17 NOTE — Anesthesia Postprocedure Evaluation (Signed)
  Anesthesia Post-op Note  Patient: Crystal Foley  Procedure(s) Performed: Procedure(s) (LRB): CYSTOSCOPY, STENT REMOVAL, LEFT URETEROSCOPY  WITH STONE REMOVAL WITH BASKET (Left)  Patient Location: PACU  Anesthesia Type: General  Level of Consciousness: awake and alert   Airway and Oxygen Therapy: Patient Spontanous Breathing  Post-op Pain: mild  Post-op Assessment: Post-op Vital signs reviewed, Patient's Cardiovascular Status Stable, Respiratory Function Stable, Patent Airway and No signs of Nausea or vomiting  Last Vitals:  Filed Vitals:   03/17/15 1336  BP: 135/62  Pulse: 79  Temp:   Resp: 16    Post-op Vital Signs: stable   Complications: No apparent anesthesia complications

## 2015-03-17 NOTE — Discharge Instructions (Signed)

## 2015-03-17 NOTE — Anesthesia Preprocedure Evaluation (Signed)
Anesthesia Evaluation  Patient identified by MRN, date of birth, ID band Patient awake    Reviewed: Allergy & Precautions, NPO status , Patient's Chart, lab work & pertinent test results  Airway Mallampati: II  TM Distance: >3 FB Neck ROM: Full    Dental no notable dental hx.    Pulmonary COPD breath sounds clear to auscultation  Pulmonary exam normal       Cardiovascular hypertension, Pt. on medications negative cardio ROS  Rhythm:Regular Rate:Normal     Neuro/Psych Seizures -, Well Controlled,  negative psych ROS   GI/Hepatic negative GI ROS, Neg liver ROS,   Endo/Other  diabetes  Renal/GU negative Renal ROS  negative genitourinary   Musculoskeletal negative musculoskeletal ROS (+)   Abdominal   Peds negative pediatric ROS (+)  Hematology negative hematology ROS (+)   Anesthesia Other Findings   Reproductive/Obstetrics negative OB ROS                             Anesthesia Physical Anesthesia Plan  ASA: II  Anesthesia Plan: General   Post-op Pain Management:    Induction: Intravenous  Airway Management Planned: LMA  Additional Equipment:   Intra-op Plan:   Post-operative Plan: Extubation in OR  Informed Consent: I have reviewed the patients History and Physical, chart, labs and discussed the procedure including the risks, benefits and alternatives for the proposed anesthesia with the patient or authorized representative who has indicated his/her understanding and acceptance.   Dental advisory given  Plan Discussed with: CRNA  Anesthesia Plan Comments:         Anesthesia Quick Evaluation

## 2015-03-17 NOTE — Anesthesia Procedure Notes (Signed)
Procedure Name: LMA Insertion Date/Time: 03/17/2015 12:03 PM Performed by: Riki Sheer Pre-anesthesia Checklist: Patient identified, Emergency Drugs available, Suction available, Patient being monitored and Timeout performed Patient Re-evaluated:Patient Re-evaluated prior to inductionOxygen Delivery Method: Circle system utilized Preoxygenation: Pre-oxygenation with 100% oxygen Intubation Type: IV induction Ventilation: Mask ventilation without difficulty LMA: LMA with gastric port inserted LMA Size: 4.0 Number of attempts: 1 Placement Confirmation: positive ETCO2,  CO2 detector and breath sounds checked- equal and bilateral Tube secured with: Tape Dental Injury: Teeth and Oropharynx as per pre-operative assessment

## 2015-03-17 NOTE — Transfer of Care (Signed)
Immediate Anesthesia Transfer of Care Note  Patient: Crystal Foley  Procedure(s) Performed: Procedure(s): CYSTOSCOPY, STENT REMOVAL, LEFT URETEROSCOPY  WITH STONE REMOVAL WITH BASKET (Left)  Patient Location: PACU  Anesthesia Type:General  Level of Consciousness: sedated  Airway & Oxygen Therapy: Patient Spontanous Breathing and Patient connected to face mask oxygen  Post-op Assessment: Report given to RN and Post -op Vital signs reviewed and stable  Post vital signs: Reviewed and stable  Last Vitals:  Filed Vitals:   03/17/15 0938  BP: 177/87  Pulse: 96  Temp: 37 C  Resp: 18    Complications: No apparent anesthesia complications

## 2015-03-17 NOTE — Progress Notes (Signed)
repport to Santiago Glad

## 2015-03-17 NOTE — Brief Op Note (Signed)
03/17/2015  12:34 PM  PATIENT:  Crystal Foley  64 y.o. female  PRE-OPERATIVE DIAGNOSIS:  LEFT PROXIMAL STONE  POST-OPERATIVE DIAGNOSIS:  LEFT PROXIMAL STONE  PROCEDURE:  Procedure(s): CYSTOSCOPY, STENT REMOVAL, LEFT URETEROSCOPY  WITH STONE REMOVAL WITH BASKET (Left)  SURGEON:  Surgeon(s) and Role:    * Irine Seal, MD - Primary  PHYSICIAN ASSISTANT:   ASSISTANTS: none   ANESTHESIA:   general  EBL:  Total I/O In: 500 [I.V.:500] Out: -   BLOOD ADMINISTERED:none  DRAINS: none   LOCAL MEDICATIONS USED:  NONE  SPECIMEN:  Source of Specimen:  stone  DISPOSITION OF SPECIMEN:  to family to bring to office  COUNTS:  YES  TOURNIQUET:  * No tourniquets in log *  DICTATION: .Other Dictation: Dictation Number [id number]  PLAN OF CARE: Discharge to home after PACU  PATIENT DISPOSITION:  PACU - hemodynamically stable.   Delay start of Pharmacological VTE agent (>24hrs) due to surgical blood loss or risk of bleeding: not applicable

## 2015-03-18 NOTE — Op Note (Signed)
NAMEALVINE, MOSTAFA NO.:  [id number]  MEDICAL RECORD NO.:  [id number]  LOCATION:  [facility]                         FACILITY:  [facility]  PHYSICIAN:  Marshall Cork. Jeffie Pollock, M.D.    DATE OF BIRTH:  Oct 30, 1951  DATE OF PROCEDURE:  03/17/2015 DATE OF DISCHARGE:  03/17/2015                              OPERATIVE REPORT   PROCEDURES: 1. Cystoscopy with removal of left double-J stent. 2. Left ureteroscopic stone extraction.  PREOPERATIVE DIAGNOSIS:  Left proximal ureteral stone.  POSTOPERATIVE DIAGNOSIS:  Left proximal ureteral stone.  SURGEON:  Marshall Cork. Jeffie Pollock, M.D.  ANESTHESIA:  General.  SPECIMEN:  Stone.  DRAINS:  None.  BLOOD LOSS:  None.  COMPLICATIONS:  None.  INDICATIONS:  Crystal Foley is a 64 year old white female, who I saw last weekend with a 9-mm left proximal ureteral stone with pain and obstruction.  She had a stent placed at [facility] and returns today for ureteroscopic stone extraction.  FINDINGS OF PROCEDURE:  She was given Cipro.  She was taken to the operating room where general anesthetic was induced.  She was placed in lithotomy position.  Her perineum and genitalia were prepped with Betadine solution.  She was draped in usual sterile fashion.  She had been fitted with PAS hose as well.  Cystoscopy was performed using a 23-French scope and 30-degree lens. The stent was visualized and was grasped with a grasping forceps and pulled the urethral meatus.  A guidewire was then advanced to the kidney and the stent was removed. A 38-cm digital access sheath was then passed first the inner core followed by the assembled sheath up to the level of the stone, which could be seen on fluoroscopy.  The inner core and wire were then removed and the dual-lumen Wolf digital flexible ureteroscope was then advanced through the sheath.  The stone pushed back into the renal pelvis, but it was easily identified. It was grasped with a NGage basket and  removed intact through the stent dilated ureter.  The ureteroscope sheath was removed along with the stone at that time.  The ureteroscope was then reinserted per urethra and passed up the ureteral meatus into the ureter.  Inspection revealed a few small clots, but no ureteral injury.  It was felt that stent replacement was not indicated.  At this point, the ureteroscope was removed.  The cystoscope was reinserted and the bladder was drained.  The patient was taken down from lithotomy position.  Her anesthetic was reversed.  She was moved to recovery room in stable condition.  The stone was given to her husband to bring to the office for analysis.  There were no complications.     Marshall Cork. Jeffie Pollock, M.D.     JJW/MEDQ  D:  03/17/2015  T:  03/18/2015  Job:  [id number]

## 2015-03-20 ENCOUNTER — Encounter (HOSPITAL_COMMUNITY): Payer: Self-pay | Admitting: Urology

## 2015-04-16 NOTE — Op Note (Signed)
PATIENT NAME:  Crystal Foley, Crystal Foley MR#:  [id number] DATE OF BIRTH:  1951-12-15  DATE OF PROCEDURE:  03/12/2015  PROCEDURE:  Cystoscopy with insertion of left double J stent.  PREOPERATIVE DIAGNOSIS:  Left ureteropelvic junction stone.  POSTOPERATIVE DIAGNOSIS:  Left ureteropelvic junction stone.  SURGEON:  Dr. Irine Seal.  ANESTHESIA:  General.  SPECIMEN:  None.  DRAINS:  6 French x 24 cm Contour double J stent.  ESTIMATED BLOOD LOSS:  None.  COMPLICATIONS:  None.  INDICATIONS:  Crystal Foley is a 64 year old white female who presented to the Emergency Room on March 26 with severe left flank pain.  CT revealed a 9 mm proximal/ureteropelvic junction stone on the left.  She did not get relief from pain medicine and antiemetics and it was felt that stenting was indicated.    FINDINGS AT PROCEDURE:  She was given Cipro.  She was taken to the operating room table where general anesthetic was induced.  She was placed in lithotomy position.  Her perineum and genitalia were prepped with Betadine solution.  She was draped in the usual sterile fashion.    Cystoscopy was performed using a 22 [place] scope and 12 degree lens.  Examination revealed a normal urethra.  The bladder wall was smooth and pale without tumor, stones, or inflammation.  The ureteral orifices were unremarkable, but the ureteral orifice on the left was effluxing bloody urine.  The Sensor guidewire was then passed to the kidney under fluoroscopic guidance.  It passed easily by the stone.  A 6 French, 24 cm, Contour double J stent was then advanced over the wire without difficulty.  The wire was removed leaving a good coil in the kidney and a good coil in the bladder.  The bladder was drained.  Cystoscope was removed.  The patient was taken out of lithotomy position, anesthetic was reversed, and she was moved to the recovery room in stable condition with no complications.    ____________________________ Marshall Cork. Jeffie Pollock,  MD jjw:sp D: 03/12/2015 01:41:31 ET T: 03/12/2015 11:48:49 ET JOB#: [id number]  cc: Marshall Cork. Jeffie Pollock, MD, <Dictator> Marshall Cork [facility] MD ELECTRONICALLY SIGNED 04/01/2015 7:15

## 2015-04-16 NOTE — Consult Note (Signed)
PATIENT NAME:  Crystal Foley, Crystal Foley MR#:  [id number] DATE OF BIRTH:  05-Mar-1951  DATE OF CONSULTATION:  03/12/2015  REFERRING PHYSICIAN:   CONSULTING PHYSICIAN:  Marshall Cork. Jeffie Pollock, MD  This is a patient of Yetta Numbers. Karma Greaser, M.D.  CHIEF COMPLAINT: Left flank pain.   HISTORY OF PRESENT ILLNESS: I was asked to see [name] in consultation for a 9 mm left UPJ stone. She had the onset at 2 p.m. on March 26 of severe left flank pain associated with nausea and vomiting. She had some blood when she wiped after voiding and had frequency, increasing incontinence, but no fever or chills. She was seen in the Emergency Room where she was noted to have a 9 mm left UPJ stone. She has a history of 3 prior stones that she passed. She had a urinary tract infection with the first one, but none since. Her only other urologic history is a bladder tack. She has received morphine and Dilaudid in the ER with antiemetics, but has persistent pain and nausea.   REVIEW OF SYSTEMS: She had some shortness of breath in December with an exacerbation of COPD, but is currently without shortness of breath. She has no chest pain, headache, swelling of the legs, and is otherwise without complaint on the 12-point review of systems.   ALLERGIES: DILANTIN.  CURRENT MEDICATIONS: Include divalproex 250 mg 3 in the morning and 2 in the evening, metformin 500 mg daily, losartan 50 mg daily, glimepiride 2 mg daily,  40 mg daily.   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 She denies coronary artery disease.   PAST SURGICAL HISTORY: Pertinent for bladder tack. She has had a closed reduction of the right arm. Remotely she had a tubal ligation. She has had 2 vaginal deliveries.   FAMILY HISTORY: Pertinent for coronary artery disease, emphysema, and brain cancer.   SOCIAL HISTORY: Negative for tobacco or alcohol. 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 She is married.   PHYSICAL EXAMINATION:  VITAL SIGNS: She weighs 200 pounds. She is 5 feet 4 inches tall. She is reported as afebrile, but her vital signs are not documented.  GENERAL: She is a well developed, well nourished, somewhat ill-appearing, white female, in no acute distress. Alert and oriented x 3. HEENT: Head and face normocephalic, atraumatic.  NECK: Supple without thyromegaly or bruit.  LUNGS: Clear with normal effort.  HEART: Regular rate and rhythm.   ABDOMEN: Soft, moderately obese with marked left upper quadrant and left CVA tenderness. No mass, hepatosplenomegaly or hernias noted.  GENITOURINARY AND RECTAL: Examination was not performed.  EXTREMITIES: Have full range of motion without edema.  SKIN: Warm and dry.  NEUROLOGIC: She has no focal deficits.  PSYCHIATRIC: She has normal mood and affect.   LABORATORY DATA: I have reviewed her laboratory work. Her chemistries are remarkable only for a slightly low chloride of 98. Her CBC is significant for a white count of 12.2 with 10.3 neutrophils. Hemoglobin is 14.3, platelet count 224,000.   Her urinalysis has 208 red cells per high power field, 2 white cells, no bacteria, and is nitrite negative.   I reviewed her CT films and report. She has a 9 mm left proximal/UPJ stone with hydronephrosis and a renal cyst. There are small stones in the kidney.   I discussed her case with Dr. Karma Greaser regarding the need for urgent intervention.   IMPRESSION: A 9 mm left proximal/ureteropelvic junction stone  with intractable pain and nausea.   PLAN: After reviewing the options, we are going to proceed with cystoscopy and placement of a left ureteral stent. Tonight I reviewed the risks of bleeding, infection, ureteral injury, need for secondary procedures such as ESWL or ureteroscopy, thrombotic events, and anesthetic complications. She is agreeable to proceed and will be done as an outpatient this evening.      ____________________________ Marshall Cork. Jeffie Pollock, MD jjw:JT D: 03/12/2015 01:01:52 ET T: 03/12/2015 08:39:04 ET JOB#: [id number]  cc: Marshall Cork. Jeffie Pollock, MD, <Dictator> Marshall Cork [facility] MD ELECTRONICALLY SIGNED 04/01/2015 7:14

## 2015-05-26 ENCOUNTER — Telehealth: Payer: Self-pay | Admitting: *Deleted

## 2015-05-26 ENCOUNTER — Other Ambulatory Visit: Payer: Self-pay | Admitting: *Deleted

## 2015-05-26 ENCOUNTER — Encounter: Payer: Self-pay | Admitting: *Deleted

## 2015-05-26 NOTE — Telephone Encounter (Signed)
Patient had called asking for results of her 24 hour urine test. Could not find results called and received results and contacted patient back to set up an appointment to come in and discuss results. Patient transferred to [facility] to set up appt. Patient ok with plan.

## 2015-05-30 ENCOUNTER — Encounter: Payer: Self-pay | Admitting: Urology

## 2015-05-30 ENCOUNTER — Ambulatory Visit
Admission: RE | Admit: 2015-05-30 | Discharge: 2015-05-30 | Disposition: A | Discharge status: Home / Self Care | Payer: 59 | Source: Ambulatory Visit | Attending: Urology | Admitting: Urology

## 2015-05-30 ENCOUNTER — Ambulatory Visit (INDEPENDENT_AMBULATORY_CARE_PROVIDER_SITE_OTHER): Payer: 59 | Admitting: Urology

## 2015-05-30 VITALS — BP 172/83 | HR 89 | Resp 18 | Ht 64.0 in | Wt 202.7 lb

## 2015-05-30 DIAGNOSIS — N200 Calculus of kidney: Secondary | ICD-10-CM | POA: Diagnosis not present

## 2015-05-30 DIAGNOSIS — N132 Hydronephrosis with renal and ureteral calculous obstruction: Secondary | ICD-10-CM | POA: Diagnosis not present

## 2015-06-01 ENCOUNTER — Telehealth: Payer: Self-pay | Admitting: Urology

## 2015-06-01 DIAGNOSIS — N1330 Unspecified hydronephrosis: Secondary | ICD-10-CM

## 2015-06-01 DIAGNOSIS — N200 Calculus of kidney: Secondary | ICD-10-CM | POA: Insufficient documentation

## 2015-06-01 NOTE — Telephone Encounter (Signed)
After looking through her records, I noticed she did not have a RUS after her stone extraction in April.  She will need to have a renal ultrasound to make sure that the hydronephrosis has resolved.

## 2015-06-01 NOTE — Progress Notes (Signed)
05/30/2015 3:12 PM   Crystal Foley [birth year] [id number]  Referring provider: [name], MD [facility] [place], [place] [address]  Chief Complaint  Patient presents with  . Follow-up    24 hr urine    HPI: Crystal Foley is a 63 y/o white female who presents today to discuss the results of her 24 hour urine.    Previous history, Patient was seen at [facility] ED on 03/12/2015 for a 9 mm left UPJ stone with intractable pain and vomiting.  Dr. Jeffie Pollock placed a left ureteral stent at that time and she followed up at [facility] for definitive treatment of the stone.  Dr. Jeffie Pollock performed a left ureteroscopic stone extraction and cystoscopy with removal of left double-J stent on 03/17/2015.    She presented to our office on 04/25/2015 for follow up and saw Dr. Elnoria Howard.  He wanted her to have a 24 hour urine and a KUB in 8 months.  She is currently not experiencing any flank pain or hematuria.  She is also not experiencing any fevers, chills, nausea, vomiting or suprapubic pain.    24 hour urine results indicate a low urinary volume and increased oxalates.     PMH: Past Medical History  Diagnosis Date  . COPD (chronic obstructive pulmonary disease)   . Diabetes mellitus   . Hypertension   . Shortness of breath dyspnea     with exertion  . Seizures     last seizure 7-8 yrs ago  . Bilateral kidney stones   . Hydronephrosis   . Hematuria, gross     Surgical History: Past Surgical History  Procedure Laterality Date  . Cystoscopy w/ ureteral stent placement  03/11/15  . Tubal ligation    . Cystoscopy with retrograde pyelogram, ureteroscopy and stent placement Left 03/17/2015    Procedure: CYSTOSCOPY, STENT REMOVAL, LEFT URETEROSCOPY  WITH STONE REMOVAL WITH BASKET;  Surgeon: Irine Seal, MD;  Location: [facility];  Service: Urology;  Laterality: Left;    Home Medications:    Medication List       This list is accurate as of: 05/30/15 11:59 PM.  Always  use your most recent med list.               albuterol 108 (90 BASE) MCG/ACT inhaler  Commonly known as:  PROVENTIL HFA;VENTOLIN HFA  Inhale 1 puff into the lungs every 6 (six) hours as needed for wheezing or shortness of breath.     atorvastatin 40 MG tablet  Commonly known as:  LIPITOR  Take 40 mg by mouth daily.     divalproex 250 MG 24 hr tablet  Commonly known as:  DEPAKOTE ER  Take 500-750 mg by mouth 2 (two) times daily.     glimepiride 2 MG tablet  Commonly known as:  AMARYL  Take 2 mg by mouth daily with breakfast.     losartan 50 MG tablet  Commonly known as:  COZAAR  Take 50 mg by mouth every morning.     metFORMIN 500 MG 24 hr tablet  Commonly known as:  GLUCOPHAGE-XR  Take 1,000 mg by mouth daily with breakfast.     promethazine 25 MG tablet  Commonly known as:  PHENERGAN  Take 25 mg by mouth every 6 (six) hours as needed for nausea or vomiting.        Allergies:  Allergies  Allergen Reactions  . Dilantin [Phenytoin Sodium Extended] Swelling    Family  History: Family History  Problem Relation Age of Onset  . Coronary artery disease Sister   . Brain cancer Father   . Emphysema Mother     Social History:  reports that she has never smoked. She does not have any smokeless tobacco history on file. She reports that she does not drink alcohol or use illicit drugs.  ROS: Urological Symptom Review  Patient is experiencing the following symptoms: Kidney stones   Review of Systems  Gastrointestinal (upper)  : Negative for upper GI symptoms  Gastrointestinal (lower) : Negative for lower GI symptoms  Constitutional : Negative for symptoms  Skin: Negative for skin symptoms  Eyes: Negative for eye symptoms  Ear/Nose/Throat : Negative for Ear/Nose/Throat symptoms  Hematologic/Lymphatic: Negative for Hematologic/Lymphatic symptoms  Cardiovascular : Negative for cardiovascular symptoms  Respiratory : Negative for respiratory  symptoms  Endocrine: Negative for endocrine symptoms  Musculoskeletal: Negative for musculoskeletal symptoms  Neurological: Negative for neurological symptoms  Psychologic: Negative for psychiatric symptoms   Physical Exam: BP 172/83 mmHg  Pulse 89  Resp 18  Ht 5\' 4"  (1.626 m)  Wt 202 lb 11.2 oz (91.944 kg)  BMI 34.78 kg/m2    Laboratory Data: Lab Results  Component Value Date   WBC 6.5 03/14/2015   HGB 13.1 03/14/2015   HCT 39.9 03/14/2015   MCV 91.1 03/14/2015   PLT 203 03/14/2015    Lab Results  Component Value Date   CREATININE 0.69 03/14/2015    No results found for: PSA  No results found for: TESTOSTERONE  No results found for: HGBA1C  Urinalysis    Component Value Date/Time   COLORURINE YELLOW 09/19/2012 1920   APPEARANCEUR CLOUDY* 09/19/2012 1920   LABSPEC 1.014 09/19/2012 1920   PHURINE 5.0 09/19/2012 1920   GLUCOSEU 100* 09/19/2012 1920   HGBUR NEGATIVE 09/19/2012 1920   BILIRUBINUR SMALL* 09/19/2012 1920   KETONESUR 40* 09/19/2012 1920   PROTEINUR NEGATIVE 09/19/2012 1920   UROBILINOGEN 1.0 09/19/2012 1920   NITRITE NEGATIVE 09/19/2012 1920   LEUKOCYTESUR MODERATE* 09/19/2012 1920    Pertinent Imaging: REASON FOR EXAM:    severe flank pain, eval for stones COMMENTS: PROCEDURE:     CT  - CT ABDOMEN /PELVIS WO (STONE)  - Mar 11 2015 10:18PM CLINICAL DATA:  Initial evaluation for ear acute severe flank pain. EXAM: CT ABDOMEN AND PELVIS WITHOUT CONTRAST TECHNIQUE: Multidetector CT imaging of the abdomen and pelvis was performed following the standard protocol without IV contrast. COMPARISON:  Prior study from 03/16/2013 as well as 03/05/2013 FINDINGS: Mild dependent atelectasis present within the visualized lung bases. Linear opacity within the lingula also consistent with atelectasis. No pleural or pericardial effusion. The liver demonstrates a normal unenhanced appearance. Gallbladder within normal limits. No biliary dilatation.  Spleen, adrenal glands, and pancreas within normal limits. Multiple parapelvic cyst present within the right kidney. There is a single nonobstructive 4 mm calculus within the lower pole the right kidney. No obstructive stone seen along the course of the right renal collecting system. There is no right-sided hydronephrosis or hydroureter. On the left, there is an obstructive 9 mm calculus at the left UPJ with secondary severe left-sided hydronephrosis. Mild left perinephric fat stranding. Additional nonobstructive calculi measuring approximately 4 mm present within the upper pole left kidney. Multiple left renal cyst noted. Distal to the left UPJ stone, no obstructive calculi seen within the left renal collecting system. Small hiatal hernia noted. Stomach otherwise unremarkable. No evidence for bowel obstruction. Appendix well visualized in the right  lower quadrant and is of normal caliber and appearance without associated inflammatory changes to suggest acute appendicitis. No abnormal wall thickening or inflammatory fat stranding seen elsewhere about the bowels. Mild diverticulosis present within the descending colon without evidence for diverticulitis. Bladder within normal limits.  Uterus and ovaries are normal. No free air or fluid. No pathologically enlarged intra-abdominal pelvic lymph nodes identified. Mild to moderate atheromatous plaque present within the intra-abdominal aorta. No aneurysm. No acute osseous abnormality. No worrisome lytic or blastic osseous lesion. On the left, there is an obstructive IMPRESSION: 1. Obstructive 9 mm stone near the left UPJ with secondary severe left hydronephrosis. 2. Additional bilateral nonobstructive nephrolithiasis as above. 3. No other acute intra-abdominal pelvic process. 4. Mild colonic diverticulosis without acute diverticulitis. Electronically Signed By: Jeannine Boga M.D.  CLINICAL DATA: Kidney stones.  EXAM: ABDOMEN  - 1 VIEW  COMPARISON: CT 03/11/2015  FINDINGS: Tiny left upper renal pole calyceal stone again noted. Previously identified left UPJ stone not visualized. Pelvic calcifications cyst with vascular calcifications. No bowel distention. No acute bony abnormality. Tiny density noted left iliac wing, most likely a bone island. No other sclerotic lesions noted.  IMPRESSION: Findings consistent with passage of left UPJ stone. Left upper pole small caliceal stone again noted.   Electronically Signed  By: Marcello Moores Register  On: 05/31/2015 08:11   Assessment & Plan:    1. Nephrolithiasis-  Patient underwent a left ureteral stone extraction with Dr. Jeffie Pollock in April 2016.  She underwent a 24 hour urine for a metabolic work up.  She was found to how a low urine volume and high oxalate.  She was given the brochures for increasing your fluid intake and lowering her oxalate in her diet from [employer].  She has also started a web site , "[email or web address]"    - Abdomen 1 view (KUB)  2. Hydronephrosis-  Patient will need to have a RUS to make sure the hydronephrosis has resolved.    No Follow-up on file.  [name], [facility] [address] [place], [place] [address] [phone]

## 2015-06-02 ENCOUNTER — Telehealth: Payer: Self-pay | Admitting: *Deleted

## 2015-06-02 NOTE — Telephone Encounter (Signed)
Spoke with patient and gave message that she needs a RUS  To make sure that the hydronephrosis has resolved. Patient ok with plan.

## 2015-06-02 NOTE — Telephone Encounter (Signed)
Pt called and was wondering about her x ray results. Pt states that she got a call from [facility] about needing a U/S and she didn't tell her about her results. Pt requesting a call back [phone]

## 2015-06-05 NOTE — Telephone Encounter (Signed)
CLINICAL DATA: Kidney stones.  EXAM: ABDOMEN - 1 VIEW  COMPARISON: CT 03/11/2015 KUB results: FINDINGS: Tiny left upper renal pole calyceal stone again noted. Previously identified left UPJ stone not visualized. Pelvic calcifications cyst with vascular calcifications. No bowel distention. No acute bony abnormality. Tiny density noted left iliac wing, most likely a bone island. No other sclerotic lesions noted.  IMPRESSION: Findings consistent with passage of left UPJ stone. Left upper pole small caliceal stone again noted.   Electronically Signed  By: Marcello Moores Register  Dr. Elnoria Howard did not order a RUS, but she needs one after the type of procedure she had to make sure her kidneys are not swollen.  I cannot tell this from an x-ray.   On: 05/31/2015 08:11

## 2015-06-05 NOTE — Telephone Encounter (Signed)
Pt stated she has a RUS next week. Cw,lpn

## 2015-06-05 NOTE — Telephone Encounter (Signed)
Pt would like results of x-ray. Please advise. Cw,lpn

## 2015-06-14 ENCOUNTER — Ambulatory Visit
Admission: RE | Admit: 2015-06-14 | Discharge: 2015-06-14 | Disposition: A | Discharge status: Home / Self Care | Payer: 59 | Source: Ambulatory Visit | Attending: Urology | Admitting: Urology

## 2015-06-14 DIAGNOSIS — N200 Calculus of kidney: Secondary | ICD-10-CM | POA: Diagnosis present

## 2015-06-14 DIAGNOSIS — N1330 Unspecified hydronephrosis: Secondary | ICD-10-CM | POA: Diagnosis present

## 2015-06-16 ENCOUNTER — Telehealth: Payer: Self-pay

## 2015-06-16 NOTE — Telephone Encounter (Signed)
-----   Message from Nori Riis, PA-C sent at 06/15/2015 10:26 PM EDT ----- RUS does not demonstrate any hydronephrosis.

## 2015-06-16 NOTE — Telephone Encounter (Signed)
Spoke with pt in reference to RUS results. Cw,lpn

## 2015-06-21 ENCOUNTER — Other Ambulatory Visit: Payer: Self-pay | Admitting: Urology

## 2015-09-19 DIAGNOSIS — Z0000 Encounter for general adult medical examination without abnormal findings: Secondary | ICD-10-CM | POA: Insufficient documentation

## 2016-02-02 ENCOUNTER — Encounter: Payer: Self-pay | Admitting: Sports Medicine

## 2016-02-02 ENCOUNTER — Ambulatory Visit (INDEPENDENT_AMBULATORY_CARE_PROVIDER_SITE_OTHER): Payer: BLUE CROSS/BLUE SHIELD | Admitting: Sports Medicine

## 2016-02-02 DIAGNOSIS — M79675 Pain in left toe(s): Secondary | ICD-10-CM | POA: Diagnosis not present

## 2016-02-02 DIAGNOSIS — L600 Ingrowing nail: Secondary | ICD-10-CM

## 2016-02-02 DIAGNOSIS — E119 Type 2 diabetes mellitus without complications: Secondary | ICD-10-CM | POA: Diagnosis not present

## 2016-02-02 DIAGNOSIS — M79674 Pain in right toe(s): Secondary | ICD-10-CM

## 2016-02-02 NOTE — Progress Notes (Signed)
Patient ID: [name], female   DOB: 1951-11-19, 65 y.o.   MRN: [id number] Subjective: TAYLA FRESCO is a 65 y.o.  Diabetic female patient presents to office today complaining of a painful incurvated, swollen medial nail border of the 1st toe on both feet. This has been present for 1 month after getting a pedicure. Patient has treated this by trying to protect the toes; states that the side of her nails hurt with pressure from shoes and with the bed sheets touching the toenails. Patient denies fever/chills/nausea/vomitting/any other related constitutional symptoms at this time.  FBS 103 this am  Patient Active Problem List   Diagnosis Date Noted  . Encounter for general adult medical examination without abnormal findings 09/19/2015  . Calculus of kidney 06/01/2015  . Hypercholesterolemia without hypertriglyceridemia 09/27/2014  . Pure hypercholesterolemia 09/27/2014  . Adult BMI 30+ 09/25/2014  . Morbid obesity ([place]) 09/25/2014  . Airway hyperreactivity 05/15/2014  . Benign hypertension 05/15/2014  . HLD (hyperlipidemia) 05/15/2014  . Seizure ([place]) 05/15/2014  . Diabetes mellitus, type 2 ([place]) 05/15/2014  . Type 2 diabetes mellitus ([place]) 05/15/2014   Current Outpatient Prescriptions on File Prior to Visit  Medication Sig Dispense Refill  . albuterol (PROVENTIL HFA;VENTOLIN HFA) 108 (90 BASE) MCG/ACT inhaler Inhale 1 puff into the lungs every 6 (six) hours as needed for wheezing or shortness of breath.    Marland Kitchen atorvastatin (LIPITOR) 40 MG tablet Take 40 mg by mouth daily.    . divalproex (DEPAKOTE ER) 250 MG 24 hr tablet Take 500-750 mg by mouth 2 (two) times daily.    Marland Kitchen glimepiride (AMARYL) 2 MG tablet Take 2 mg by mouth daily with breakfast.    . losartan (COZAAR) 50 MG tablet Take 50 mg by mouth every morning.    . metFORMIN (GLUCOPHAGE-XR) 500 MG 24 hr tablet Take 1,000 mg by mouth daily with breakfast.    . promethazine (PHENERGAN) 25 MG tablet Take 25 mg by mouth every 6 (six)  hours as needed for nausea or vomiting.     No current facility-administered medications on file prior to visit.   Allergies  Allergen Reactions  . Dilantin [Phenytoin Sodium Extended] Swelling  . Phenytoin Swelling   Objective:  General: Well developed, nourished, in no acute distress, alert and oriented x3   Dermatology: Skin is warm, dry and supple bilateral. Bilateral hallux nail appears to be  severely incurvated with hyperkeratosis formation at the distal aspects of  the medial nail border. (-) Erythema. (+) Edema. (-) serosanguous  drainage present. The remaining nails appear unremarkable at this time. There are no open sores, lesions or other signs of infection present.  Vascular: Dorsalis Pedis artery and Posterior Tibial artery pedal pulses are 2/4 bilateral with immedate capillary fill time. Pedal hair growth present. No lower extremity edema.   Neruologic: Grossly intact via light touch bilateral. Protective and epicritic intact bilateral.   Musculoskeletal: Tenderness to palpation of the medial nail folds, bilateral hallux. +Bunion deformity bilateral. Muscular strength within normal limits in all groups bilateral.   Assesement and Plan: Problem List Items Addressed This Visit    None    Visit Diagnoses    Ingrown nail    -  Primary    Bilateral hallux medial margin    Toe pain, bilateral        Diabetes mellitus without complication (HCC)        Relevant Medications    aspirin EC 81 MG tablet      -Discussed  treatment alternatives and plan of care; Explained permanent/temporary nail avulsion and post procedure course to patient. Patient opt for PNA.  - After a verbal consent, injected 3 ml of a 50:50 mixture of 2% plain  lidocaine and 0.5% plain marcaine in a normal hallux block fashion. Next, a  betadine prep was performed. Anesthesia was tested and found to be appropriate.  The offending medial bilateral hallux nail borders were then incised from the  hyponychium to the epinychium. The offending nail borders were removed and cleared from the field. The areas were curretted for any remaining nail or spicules. Phenol application performed and the areas were then flushed with alcohol and dressed with antibiotic cream and a dry sterile dressing. -Patient was instructed to leave the dressing intact for today and begin soaking  in a weak solution of betadine and water tomorrow. Patient was instructed to  soak for 15 minutes each day and apply neosporin and a gauze or bandaid dressing each day. -Patient was instructed to monitor the toes for signs of infection and return to office if toe becomes red, hot or swollen. -Patient is to return in 1 week for follow up care or sooner if problems arise.  Landis Martins, DPM

## 2016-02-02 NOTE — Progress Notes (Deleted)
   Subjective:    Patient ID: Crystal Foley, female    DOB: 1951-05-24, 65 y.o.   MRN: [id number]  HPI    Review of Systems  Endocrine:       Diabetic   All other systems reviewed and are negative.      Objective:   Physical Exam        Assessment & Plan:

## 2016-02-02 NOTE — Patient Instructions (Signed)

## 2016-02-09 ENCOUNTER — Encounter: Payer: Self-pay | Admitting: Sports Medicine

## 2016-02-09 ENCOUNTER — Ambulatory Visit (INDEPENDENT_AMBULATORY_CARE_PROVIDER_SITE_OTHER): Payer: BLUE CROSS/BLUE SHIELD | Admitting: Sports Medicine

## 2016-02-09 DIAGNOSIS — M79675 Pain in left toe(s): Secondary | ICD-10-CM

## 2016-02-09 DIAGNOSIS — L600 Ingrowing nail: Secondary | ICD-10-CM

## 2016-02-09 DIAGNOSIS — M79674 Pain in right toe(s): Secondary | ICD-10-CM

## 2016-02-09 DIAGNOSIS — B351 Tinea unguium: Secondary | ICD-10-CM

## 2016-02-09 DIAGNOSIS — E119 Type 2 diabetes mellitus without complications: Secondary | ICD-10-CM | POA: Diagnosis not present

## 2016-02-09 NOTE — Patient Instructions (Signed)
Soak 1/4 cup of epsom salt with warm water once daily and cover toes with neosporin and band-aid for about 1-2 weeks or until areas have healed

## 2016-02-09 NOTE — Progress Notes (Signed)
Patient ID: Crystal Foley, female   DOB: November 20, 1951, 65 y.o.   MRN: [id number] Subjective: Crystal Foley is a 65 y.o. diabetic female patient returns to office today for follow up evaluation after having Right & Left Hallux medial permanent nail avulsions performed on 02-02-16. Patient has been soaking using betadine and applying topical antibiotic covered with bandaid daily. Patient deniesfever/chills/nausea/vomitting/any other related constitutional symptoms at this time.  Patient desires to have her nails trimmed at this visit.  FBS 120  Patient Active Problem List   Diagnosis Date Noted  . Encounter for general adult medical examination without abnormal findings 09/19/2015  . Calculus of kidney 06/01/2015  . Hypercholesterolemia without hypertriglyceridemia 09/27/2014  . Pure hypercholesterolemia 09/27/2014  . Adult BMI 30+ 09/25/2014  . Morbid obesity ([place]) 09/25/2014  . Airway hyperreactivity 05/15/2014  . Benign hypertension 05/15/2014  . HLD (hyperlipidemia) 05/15/2014  . Seizure ([place]) 05/15/2014  . Diabetes mellitus, type 2 ([place]) 05/15/2014  . Type 2 diabetes mellitus ([place]) 05/15/2014    Current Outpatient Prescriptions on File Prior to Visit  Medication Sig Dispense Refill  . albuterol (PROVENTIL HFA;VENTOLIN HFA) 108 (90 BASE) MCG/ACT inhaler Inhale 1 puff into the lungs every 6 (six) hours as needed for wheezing or shortness of breath.    Marland Kitchen aspirin EC 81 MG tablet Take by mouth.    Marland Kitchen atorvastatin (LIPITOR) 40 MG tablet Take 40 mg by mouth daily.    . divalproex (DEPAKOTE ER) 250 MG 24 hr tablet Take 500-750 mg by mouth 2 (two) times daily.    Marland Kitchen glimepiride (AMARYL) 2 MG tablet Take 2 mg by mouth daily with breakfast.    . losartan (COZAAR) 50 MG tablet Take 50 mg by mouth every morning.    . metFORMIN (GLUCOPHAGE-XR) 500 MG 24 hr tablet Take 1,000 mg by mouth daily with breakfast.    . promethazine (PHENERGAN) 25 MG tablet Take 25 mg by mouth every 6 (six) hours as needed  for nausea or vomiting.     No current facility-administered medications on file prior to visit.    Allergies  Allergen Reactions  . Dilantin [Phenytoin Sodium Extended] Swelling  . Phenytoin Swelling    Objective:  General: Well developed, nourished, in no acute distress, alert and oriented x3   Dermatology: Skin is warm, dry and supple bilateral. Bilateral hallux medial nail beds appear to be clean, dry, with mild granular tissue and surrounding eschar/scab. (-) Erythema. (-) Edema. (-) serosanguous drainage present. The remaining nails are mildly elongated, thickened and discolored consistent with onychomycosis. There are no other lesions or other signs of infection present.  Neurovascular status: Intact. No lower extremity swelling; No pain with calf compression bilateral.  Musculoskeletal: Decreased tenderness to palpation of the Bilateral hallux medial nail fold(s). Muscular strength within normal limits bilateral.   Assesement and Plan: Problem List Items Addressed This Visit    None    Visit Diagnoses    Ingrown nail    -  Primary    S/p PNA medial margins bilateral     Toe pain, bilateral        Diabetes mellitus without complication (HCC)        Dermatophytosis of nail          -Examined patient  -Cleansed right & left hallux medial nail folds and gently scrubbed with peroxide and q-tip/curetted away eschar at site and applied antibiotic cream covered with bandaid.  -Discussed plan of care with patient. -Patient to now begin soaking in  a weak solution of Epsom salt and warm water. Patient was instructed to soak for 15-20 minutes each day until the toes appears normal and there is no drainage, redness, tenderness, or swelling at the procedure site, and apply neosporin and a gauze or bandaid dressing each day as needed. May leave open to air at night. -Educated patient on long term care after nail surgery. -Patient was instructed to monitor the toe for reoccurrence and  signs of infection; Patient advised to return to office if toe becomes red, hot or swollen. -To all remaining nails mechanically debrided using sterile nail nipper without incident -Patient to return in 3 months for routine diabetic foot care and exam or sooner if problems arise.   Landis Martins, DPM

## 2016-04-04 ENCOUNTER — Emergency Department
Admission: EM | Admit: 2016-04-04 | Discharge: 2016-04-04 | Disposition: A | Discharge status: Home / Self Care | Payer: BLUE CROSS/BLUE SHIELD | Attending: Emergency Medicine | Admitting: Emergency Medicine

## 2016-04-04 ENCOUNTER — Emergency Department: Payer: BLUE CROSS/BLUE SHIELD

## 2016-04-04 DIAGNOSIS — Z7982 Long term (current) use of aspirin: Secondary | ICD-10-CM | POA: Insufficient documentation

## 2016-04-04 DIAGNOSIS — R000 Tachycardia, unspecified: Secondary | ICD-10-CM | POA: Insufficient documentation

## 2016-04-04 DIAGNOSIS — Z7984 Long term (current) use of oral hypoglycemic drugs: Secondary | ICD-10-CM | POA: Insufficient documentation

## 2016-04-04 DIAGNOSIS — E7800 Pure hypercholesterolemia, unspecified: Secondary | ICD-10-CM | POA: Insufficient documentation

## 2016-04-04 DIAGNOSIS — I10 Essential (primary) hypertension: Secondary | ICD-10-CM | POA: Insufficient documentation

## 2016-04-04 DIAGNOSIS — E119 Type 2 diabetes mellitus without complications: Secondary | ICD-10-CM | POA: Insufficient documentation

## 2016-04-04 DIAGNOSIS — J449 Chronic obstructive pulmonary disease, unspecified: Secondary | ICD-10-CM | POA: Diagnosis not present

## 2016-04-04 DIAGNOSIS — Z79899 Other long term (current) drug therapy: Secondary | ICD-10-CM | POA: Diagnosis not present

## 2016-04-04 DIAGNOSIS — F419 Anxiety disorder, unspecified: Secondary | ICD-10-CM

## 2016-04-04 DIAGNOSIS — E785 Hyperlipidemia, unspecified: Secondary | ICD-10-CM | POA: Insufficient documentation

## 2016-04-04 LAB — CBC
HCT: 44.6 % (ref 35.0–47.0)
Hemoglobin: 15 g/dL (ref 12.0–16.0)
MCH: 29.9 pg (ref 26.0–34.0)
MCHC: 33.6 g/dL (ref 32.0–36.0)
MCV: 89 fL (ref 80.0–100.0)
PLATELETS: 229 10*3/uL (ref 150–440)
RBC: 5.02 MIL/uL (ref 3.80–5.20)
RDW: 14.8 % — ABNORMAL HIGH (ref 11.5–14.5)
WBC: 11.9 10*3/uL — ABNORMAL HIGH (ref 3.6–11.0)

## 2016-04-04 LAB — FIBRIN DERIVATIVES D-DIMER (ARMC ONLY): Fibrin derivatives D-dimer (ARMC): 333 (ref 0–499)

## 2016-04-04 LAB — BASIC METABOLIC PANEL
Anion gap: 17 — ABNORMAL HIGH (ref 5–15)
BUN: 13 mg/dL (ref 6–20)
CALCIUM: 9 mg/dL (ref 8.9–10.3)
CO2: 19 mmol/L — AB (ref 22–32)
CREATININE: 0.78 mg/dL (ref 0.44–1.00)
Chloride: 100 mmol/L — ABNORMAL LOW (ref 101–111)
GFR calc non Af Amer: >60 mL/min (ref >60)
GLUCOSE: 254 mg/dL — AB (ref 65–99)
Potassium: 3.2 mmol/L — ABNORMAL LOW (ref 3.5–5.1)
Sodium: 136 mmol/L (ref 135–145)

## 2016-04-04 LAB — TSH: TSH: 2.184 u[IU]/mL (ref 0.350–4.500)

## 2016-04-04 LAB — TROPONIN I: Troponin I: <0.03 ng/mL (ref <0.031)

## 2016-04-04 MED ORDER — LORAZEPAM 2 MG/ML IJ SOLN
INTRAMUSCULAR | Status: AC
  Administered 2016-04-04: 1 mg via INTRAVENOUS
  Filled 2016-04-04: qty 1

## 2016-04-04 MED ORDER — LORAZEPAM 2 MG/ML IJ SOLN
1.0000 mg | Freq: Once | INTRAMUSCULAR | Status: AC
  Administered 2016-04-04: 1 mg via INTRAVENOUS

## 2016-04-04 NOTE — ED Notes (Signed)
Pt returned from CT via stretcher.

## 2016-04-04 NOTE — ED Notes (Signed)
Pt transported to CT via stretcher.  

## 2016-04-04 NOTE — ED Provider Notes (Signed)
[facility] Emergency Department Provider Note  ____________________________________________  Time seen: 3:30 AM  I have reviewed the triage vital signs and the nursing notes.   HISTORY  Chief Complaint Anxiety and Tachycardia      HPI Crystal Foley is a 65 y.o. female presents via EMS with history of awakening this morning with feeling very anxious and breathing rapidly and feeling "jittery accompanied by dizziness and nausea. Patient presents to the emergency Department a respiratory rate of 37 tachycardic heart rate of 138.     Past Medical History  Diagnosis Date  . COPD (chronic obstructive pulmonary disease) ([place])   . Diabetes mellitus   . Hypertension   . Shortness of breath dyspnea     with exertion  . Seizures ([place])     last seizure 7-8 yrs ago  . Bilateral kidney stones   . Hydronephrosis   . Hematuria, gross     Patient Active Problem List   Diagnosis Date Noted  . Encounter for general adult medical examination without abnormal findings 09/19/2015  . Calculus of kidney 06/01/2015  . Hypercholesterolemia without hypertriglyceridemia 09/27/2014  . Pure hypercholesterolemia 09/27/2014  . Adult BMI 30+ 09/25/2014  . Morbid obesity ([place]) 09/25/2014  . Airway hyperreactivity 05/15/2014  . Benign hypertension 05/15/2014  . HLD (hyperlipidemia) 05/15/2014  . Seizure ([place]) 05/15/2014  . Diabetes mellitus, type 2 ([place]) 05/15/2014  . Type 2 diabetes mellitus ([place]) 05/15/2014    Past Surgical History  Procedure Laterality Date  . Cystoscopy w/ ureteral stent placement  03/11/15  . Tubal ligation    . Cystoscopy with retrograde pyelogram, ureteroscopy and stent placement Left 03/17/2015    Procedure: CYSTOSCOPY, STENT REMOVAL, LEFT URETEROSCOPY  WITH STONE REMOVAL WITH BASKET;  Surgeon: Irine Seal, MD;  Location: [facility]S;  Service: Urology;  Laterality: Left;    Current Outpatient Rx  Name  Route  Sig  Dispense  Refill  . albuterol  (PROVENTIL HFA;VENTOLIN HFA) 108 (90 BASE) MCG/ACT inhaler   Inhalation   Inhale 1 puff into the lungs every 6 (six) hours as needed for wheezing or shortness of breath.         Marland Kitchen aspirin EC 81 MG tablet   Oral   Take by mouth.         Marland Kitchen atorvastatin (LIPITOR) 40 MG tablet   Oral   Take 40 mg by mouth daily.         . divalproex (DEPAKOTE ER) 250 MG 24 hr tablet   Oral   Take 500-750 mg by mouth 2 (two) times daily.         Marland Kitchen glimepiride (AMARYL) 2 MG tablet   Oral   Take 2 mg by mouth daily with breakfast.         . losartan (COZAAR) 50 MG tablet   Oral   Take 50 mg by mouth every morning.         . metFORMIN (GLUCOPHAGE-XR) 500 MG 24 hr tablet   Oral   Take 1,000 mg by mouth daily with breakfast.         . promethazine (PHENERGAN) 25 MG tablet   Oral   Take 25 mg by mouth every 6 (six) hours as needed for nausea or vomiting.           Allergies Dilantin and Phenytoin  Family History  Problem Relation Age of Onset  . Coronary artery disease Sister   . Brain cancer Father   . Emphysema Mother  Social History Social History  Substance Use Topics  . Smoking status: Never Smoker   . Smokeless tobacco: None  . Alcohol Use: No    Review of Systems  Constitutional: Negative for fever. Eyes: Negative for visual changes. ENT: Negative for sore throat. Cardiovascular: Negative for chest pain. Respiratory: Negative for shortness of breath. Gastrointestinal: Negative for abdominal pain, vomiting and diarrhea. Genitourinary: Negative for dysuria. Musculoskeletal: Negative for back pain. Skin: Negative for rash. Neurological: Negative for headaches, focal weakness or numbness. Psychiatric:Positive for anxiety  10-point ROS otherwise negative.  ____________________________________________   PHYSICAL EXAM:  VITAL SIGNS: ED Triage Vitals  Enc Vitals Group     BP 04/04/16 0311 188/111 mmHg     Pulse Rate 04/04/16 0311 138     Resp  04/04/16 0311 30     Temp 04/04/16 0318 97.9 F (36.6 C)     Temp Source 04/04/16 0318 Oral     SpO2 04/04/16 0311 100 %     Weight --      Height --      Head Cir --      Peak Flow --      Pain Score 04/04/16 0312 0     Pain Loc --      Pain Edu? --      Excl. in [place]? --      Constitutional: Alert and oriented. Well appearing and in no distress. Eyes: Conjunctivae are normal. PERRL. Normal extraocular movements. ENT   Head: Normocephalic and atraumatic.   Nose: No congestion/rhinnorhea.   Mouth/Throat: Mucous membranes are moist.   Neck: No stridor. Hematological/Lymphatic/Immunilogical: No cervical lymphadenopathy. Cardiovascular: Tachycardia. Normal and symmetric distal pulses are present in all extremities. No murmurs, rubs, or gallops. Respiratory:Tachypnea, Breath sounds are clear and equal bilaterally. No wheezes/rales/rhonchi. Gastrointestinal: Soft and nontender. No distention. There is no CVA tenderness. Genitourinary: deferred Musculoskeletal: Nontender with normal range of motion in all extremities. No joint effusions.  No lower extremity tenderness nor edema. Neurologic:  Normal speech and language. No gross focal neurologic deficits are appreciated. Speech is normal. Bilateral upper extremity tremulousness Skin:  Skin is warm, dry and intact. No rash noted. Psychiatric: Very anxious affect Speech and behavior are normal. Patient exhibits appropriate insight and judgment.  ____________________________________________    LABS (pertinent positives/negatives)  Labs Reviewed  BASIC METABOLIC PANEL - Abnormal; Notable for the following:    Potassium 3.2 (*)    Chloride 100 (*)    CO2 19 (*)    Glucose, Bld 254 (*)    Anion gap 17 (*)    All other components within normal limits  CBC - Abnormal; Notable for the following:    WBC 11.9 (*)    RDW 14.8 (*)    All other components within normal limits  TROPONIN I  FIBRIN DERIVATIVES D-DIMER ([facility] ONLY)   TROPONIN I  TSH     ____________________________________________   EKG  ED ECG REPORT I, Hur[place] N BROWN, the attending physician, personally viewed and interpreted this ECG.   Date: 04/04/2016  EKG Time: 2:59 AM  Rate: 139  Rhythm: Sinus Tachycardia  Axis: Normal  Intervals: Normal  ST&T Change: None   ____________________________________________    RADIOLOGY     CT Head Wo Contrast (Final result) Result time: 04/04/16 03:38:48   Final result by Rad Results In Interface (04/04/16 03:38:48)   Narrative:   CLINICAL DATA: Dizziness and tachycardia.  EXAM: CT HEAD WITHOUT CONTRAST  TECHNIQUE: Contiguous axial images were obtained from the  base of the skull through the vertex without intravenous contrast.  COMPARISON: Head CT 07/13/2009  FINDINGS: Brain: No evidence of acute infarction, hemorrhage, extra-axial collection, ventriculomegaly, or mass effect. Generalized atrophy and mild chronic small vessel ischemia.  Vascular: No hyperdense vessel or unexpected calcification.  Skull: Negative for fracture or focal lesion.  Sinuses/Orbits: No acute findings.  Other: None.  IMPRESSION: Atrophy and chronic small vessel ischemia. No acute process.   Electronically Signed By: Jeb Levering M.D. On: 04/04/2016 03:38          DG Chest Port 1 View (Final result) Result time: 04/04/16 03:30:06   Final result by Rad Results In Interface (04/04/16 03:30:06)   Narrative:   CLINICAL DATA: Patient woke up this morning with anxiety, shallow breath, dizziness, and nausea.  EXAM: PORTABLE CHEST 1 VIEW  COMPARISON: 12/04/2014  FINDINGS: Shallow inspiration with atelectasis in the lung bases. Normal heart size and pulmonary vascularity. No focal airspace disease or consolidation in the lungs. No blunting of costophrenic angles. No pneumothorax. Mediastinal contours appear intact.  IMPRESSION: Shallow inspiration with atelectasis in  the lung bases.   Electronically Signed By: Lucienne Capers M.D. On: 04/04/2016 03:30         ____________________________________________   INITIAL IMPRESSION / ASSESSMENT AND PLAN / ED COURSE  Pertinent labs & imaging results that were available during my care of the patient were reviewed by me and considered in my medical decision making (see chart for details).  She physical exam consistent with hyperventilation with CO2 of 19 noted on CMP patient received 1 mg of Ativan with improvement of respiratory status resultant respiratory rate of 16 tachycardia improved to heart rate 111. Patient's care transferred to Dr. Dwaine Deter  ____________________________________________   FINAL CLINICAL IMPRESSION(S) / ED DIAGNOSES  Final diagnoses:  Tachycardia  Anxiety      Gregor Hams, MD 04/05/16 [phone]

## 2016-04-04 NOTE — ED Provider Notes (Signed)
Procedure patient report from [employer]. Plan to reevaluate after the ABG performed given the patient's CO2 was low on metabolic panel.  Venous gas reviewed, normal pH, CO2 just slightly low at 34 but no evidence of acidosis.  Patient awake alert in no distress.  Filed Vitals:   04/04/16 0845 04/04/16 0900  BP:  132/78  Pulse: 100 94  Temp:    Resp: 15 15   Currently resting comfortably, normal heart rate, blood pressure, and respiratory rate. She and her husband are both very pleasant. She reports she feels fine at the time with no symptoms. Currently satting 98% on room air. Awake alert, very amicable and pleasant in no distress.  She is awake alert and oriented. Plan to discharge home as discussed with Dr. Owens Shark previously. She appears stable.  Return precautions and treatment recommendations and follow-up discussed with the patient who is agreeable with the plan.   Delman Kitten, MD 04/04/16 [phone]

## 2016-04-04 NOTE — ED Notes (Signed)
Resumed care from [place]. Pt alert & oriented and helped to restroom. NAD noted.

## 2016-04-04 NOTE — Discharge Instructions (Signed)
Panic Attacks Panic attacks are sudden, short-livedsurges of severe anxiety, fear, or discomfort. They may occur for no reason when you are relaxed, when you are anxious, or when you are sleeping. Panic attacks may occur for a number of reasons:   Healthy people occasionally have panic attacks in extreme, life-threatening situations, such as war or natural disasters. Normal anxiety is a protective mechanism of the body that helps [place] react to danger (fight or flight response).  Panic attacks are often seen with anxiety disorders, such as panic disorder, social anxiety disorder, generalized anxiety disorder, and phobias. Anxiety disorders cause excessive or uncontrollable anxiety. They may interfere with your relationships or other life activities.  Panic attacks are sometimes seen with other mental illnesses, such as depression and posttraumatic stress disorder.  Certain medical conditions, prescription medicines, and drugs of abuse can cause panic attacks. SYMPTOMS  Panic attacks start suddenly, peak within 20 minutes, and are accompanied by four or more of the following symptoms:  Pounding heart or fast heart rate (palpitations).  Sweating.  Trembling or shaking.  Shortness of breath or feeling smothered.  Feeling choked.  Chest pain or discomfort.  Nausea or strange feeling in your stomach.  Dizziness, light-headedness, or feeling like you will faint.  Chills or hot flushes.  Numbness or tingling in your lips or hands and feet.  Feeling that things are not real or feeling that you are not yourself.  Fear of losing control or going crazy.  Fear of dying. Some of these symptoms can mimic serious medical conditions. For example, you may think you are having a heart attack. Although panic attacks can be very scary, they are not life threatening. DIAGNOSIS  Panic attacks are diagnosed through an assessment by your health care provider. Your health care provider will ask  questions about your symptoms, such as where and when they occurred. Your health care provider will also ask about your medical history and use of alcohol and drugs, including prescription medicines. Your health care provider may order blood tests or other studies to rule out a serious medical condition. Your health care provider may refer you to a mental health professional for further evaluation. TREATMENT   Most healthy people who have one or two panic attacks in an extreme, life-threatening situation will not require treatment.  The treatment for panic attacks associated with anxiety disorders or other mental illness typically involves counseling with a mental health professional, medicine, or a combination of both. Your health care provider will help determine what treatment is best for you.  Panic attacks due to physical illness usually go away with treatment of the illness. If prescription medicine is causing panic attacks, talk with your health care provider about stopping the medicine, decreasing the dose, or substituting another medicine.  Panic attacks due to alcohol or drug abuse go away with abstinence. Some adults need professional help in order to stop drinking or using drugs. HOME CARE INSTRUCTIONS   Take all medicines as directed by your health care provider.   Schedule and attend follow-up visits as directed by your health care provider. It is important to keep all your appointments. SEEK MEDICAL CARE IF:  You are not able to take your medicines as prescribed.  Your symptoms do not improve or get worse. SEEK IMMEDIATE MEDICAL CARE IF:   You experience panic attack symptoms that are different than your usual symptoms.  You have serious thoughts about hurting yourself or others.  You are taking medicine for panic attacks and  have a serious side effect. MAKE SURE YOU:  Understand these instructions.  Will watch your condition.  Will get help right away if you are not  doing well or get worse.   This information is not intended to replace advice given to you by your health care provider. Make sure you discuss any questions you have with your health care provider.   Document Released: 12/02/2005 Document Revised: 12/07/2013 Document Reviewed: 07/16/2013 Elsevier Interactive Patient Education 2016 Elsevier Inc.  Nonspecific Tachycardia Tachycardia is a faster than normal heartbeat (more than 100 beats per minute). In adults, the heart normally beats between 60 and 100 times a minute. A fast heartbeat may be a normal response to exercise or stress. It does not necessarily mean that something is wrong. However, sometimes when your heart beats too fast it may not be able to pump enough blood to the rest of your body. This can result in chest pain, shortness of breath, dizziness, and even fainting. Nonspecific tachycardia means that the specific cause or pattern of your tachycardia is unknown. CAUSES  Tachycardia may be harmless or it may be due to a more serious underlying cause. Possible causes of tachycardia include:  Exercise or exertion.  Fever.  Pain or injury.  Infection.  Loss of body fluids (dehydration).  Overactive thyroid.  Lack of red blood cells (anemia).  Anxiety and stress.  Alcohol.  Caffeine.  Tobacco products.  Diet pills.  Illegal drugs.  Heart disease. SYMPTOMS  Rapid or irregular heartbeat (palpitations).  Suddenly feeling your heart beating (cardiac awareness).  Dizziness.  Tiredness (fatigue).  Shortness of breath.  Chest pain.  Nausea.  Fainting. DIAGNOSIS  Your caregiver will perform a physical exam and take your medical history. In some cases, a heart specialist (cardiologist) may be consulted. Your caregiver may also order:  Blood tests.  Electrocardiography. This test records the electrical activity of your heart.  A heart monitoring test. TREATMENT  Treatment will depend on the likely cause  of your tachycardia. The goal is to treat the underlying cause of your tachycardia. Treatment methods may include:  Replacement of fluids or blood through an intravenous (IV) tube for moderate to severe dehydration or anemia.  New medicines or changes in your current medicines.  Diet and lifestyle changes.  Treatment for certain infections.  Stress relief or relaxation methods. HOME CARE INSTRUCTIONS   Rest.  Drink enough fluids to keep your urine clear or pale yellow.  Do not smoke.  Avoid:  Caffeine.  Tobacco.  Alcohol.  Chocolate.  Stimulants such as over-the-counter diet pills or pills that help you stay awake.  Situations that cause anxiety or stress.  Illegal drugs such as marijuana, phencyclidine (PCP), and cocaine.  Only take medicine as directed by your caregiver.  Keep all follow-up appointments as directed by your caregiver. SEEK IMMEDIATE MEDICAL CARE IF:   You have pain in your chest, upper arms, jaw, or neck.  You become weak, dizzy, or feel faint.  You have palpitations that will not go away.  You vomit, have diarrhea, or pass blood in your stool.  Your skin is cool, pale, and wet.  You have a fever that will not go away with rest, fluids, and medicine. MAKE SURE YOU:   Understand these instructions.  Will watch your condition.  Will get help right away if you are not doing well or get worse.   This information is not intended to replace advice given to you by your health care provider. Make  sure you discuss any questions you have with your health care provider.   Document Released: 01/09/2005 Document Revised: 02/24/2012 Document Reviewed: 06/16/2015 Elsevier Interactive Patient Education [employer].

## 2016-04-04 NOTE — ED Notes (Signed)
Pt discharged home after verbalizing understanding of discharge instructions; nad noted. 

## 2016-04-04 NOTE — ED Notes (Addendum)
Pt arrives via [facility] from home after waking up this morning with anxiety, shallow breathing, dry mouth, feeling jittery. Pt states tingling in her hands. Pt states "I woke up, felt like I needed to go to the bathroom, got dizzy, felt nauseous, asked my husband for some water." Pt is very breathy, repeating what she is saying, stating things slowly. With EMS, BP 191/100, CBG 150, HR 140's. Pt denies hx of anxiety. Pt's arms are shaking currently. Hx of diabetes and epilepsy.

## 2016-04-08 LAB — BLOOD GAS, VENOUS
O2 Saturation: 87.1 %
Patient temperature: 37
pCO2, Ven: 34 mmHg — ABNORMAL LOW (ref 44.0–60.0)
pH, Ven: 7.42 (ref 7.320–7.430)

## 2016-05-17 ENCOUNTER — Encounter: Payer: Self-pay | Admitting: Sports Medicine

## 2016-05-17 ENCOUNTER — Ambulatory Visit (INDEPENDENT_AMBULATORY_CARE_PROVIDER_SITE_OTHER): Payer: BLUE CROSS/BLUE SHIELD | Admitting: Sports Medicine

## 2016-05-17 DIAGNOSIS — M79675 Pain in left toe(s): Secondary | ICD-10-CM

## 2016-05-17 DIAGNOSIS — B351 Tinea unguium: Secondary | ICD-10-CM

## 2016-05-17 DIAGNOSIS — M79674 Pain in right toe(s): Secondary | ICD-10-CM | POA: Diagnosis not present

## 2016-05-17 DIAGNOSIS — E119 Type 2 diabetes mellitus without complications: Secondary | ICD-10-CM | POA: Diagnosis not present

## 2016-05-17 NOTE — Progress Notes (Signed)
Patient ID: Crystal Foley, female   DOB: [birth year], 65 y.o.   MRN: [id number] Subjective: BRYNNA Foley is a 65 y.o. female patient with history of diabetes who presents to office today complaining of long, painful nails  while ambulating in shoes; unable to trim. Patient states that the glucose reading this morning was 114 mg/dl. Patient denies any new changes in medication or new problems. Patient denies any new cramping, numbness, burning or tingling in the legs.  Patient Active Problem List   Diagnosis Date Noted  . Encounter for general adult medical examination without abnormal findings 09/19/2015  . Calculus of kidney 06/01/2015  . Hypercholesterolemia without hypertriglyceridemia 09/27/2014  . Pure hypercholesterolemia 09/27/2014  . Adult BMI 30+ 09/25/2014  . Morbid obesity ([place]) 09/25/2014  . Airway hyperreactivity 05/15/2014  . Benign hypertension 05/15/2014  . HLD (hyperlipidemia) 05/15/2014  . Seizure ([place]) 05/15/2014  . Diabetes mellitus, type 2 ([place]) 05/15/2014  . Type 2 diabetes mellitus ([place]) 05/15/2014   Current Outpatient Prescriptions on File Prior to Visit  Medication Sig Dispense Refill  . albuterol (PROVENTIL HFA;VENTOLIN HFA) 108 (90 BASE) MCG/ACT inhaler Inhale 1 puff into the lungs every 6 (six) hours as needed for wheezing or shortness of breath.    Marland Kitchen aspirin EC 81 MG tablet Take 81 mg by mouth daily.     Marland Kitchen atorvastatin (LIPITOR) 40 MG tablet Take 40 mg by mouth daily.    . divalproex (DEPAKOTE ER) 250 MG 24 hr tablet Take 500-750 mg by mouth 2 (two) times daily. Take 585m in morning and take 7547mat night    . fluticasone (FLONASE) 50 MCG/ACT nasal spray Place 2 sprays into both nostrils daily as needed for allergies or rhinitis.    . Marland Kitchenlimepiride (AMARYL) 2 MG tablet Take 2 mg by mouth daily with breakfast.    . losartan (COZAAR) 50 MG tablet Take 50 mg by mouth every morning.    . metFORMIN (GLUCOPHAGE-XR) 500 MG 24 hr tablet Take 2,000 mg by mouth daily  with supper.     . promethazine (PHENERGAN) 25 MG tablet Take 25 mg by mouth every 6 (six) hours as needed for nausea or vomiting.     No current facility-administered medications on file prior to visit.   Allergies  Allergen Reactions  . Dilantin [Phenytoin Sodium Extended] Swelling  . Phenytoin Swelling    Recent Results (from the past 2160 hour(s))  Basic metabolic panel     Status: Abnormal   Collection Time: 04/04/16  3:00 AM  Result Value Ref Range   Sodium 136 135 - 145 mmol/L   Potassium 3.2 (L) 3.5 - 5.1 mmol/L    Comment: HEMOLYSIS AT THIS LEVEL MAY AFFECT RESULT   Chloride 100 (L) 101 - 111 mmol/L   CO2 19 (L) 22 - 32 mmol/L   Glucose, Bld 254 (H) 65 - 99 mg/dL   BUN 13 6 - 20 mg/dL   Creatinine, Ser 0.78 0.44 - 1.00 mg/dL   Calcium 9.0 8.9 - 10.3 mg/dL   GFR calc non Af Amer >60 >60 mL/min   GFR calc Af Amer >60 >60 mL/min    Comment: (NOTE) The eGFR has been calculated using the CKD EPI equation. This calculation has not been validated in all clinical situations. eGFR's persistently <60 mL/min signify possible Chronic Kidney Disease.    Anion gap 17 (H) 5 - 15  CBC     Status: Abnormal   Collection Time: 04/04/16  3:00 AM  Result Value  Ref Range   WBC 11.9 (H) 3.6 - 11.0 K/uL   RBC 5.02 3.80 - 5.20 MIL/uL   Hemoglobin 15.0 12.0 - 16.0 g/dL   HCT 44.6 35.0 - 47.0 %   MCV 89.0 80.0 - 100.0 fL   MCH 29.9 26.0 - 34.0 pg   MCHC 33.6 32.0 - 36.0 g/dL   RDW 14.8 (H) 11.5 - 14.5 %   Platelets 229 150 - 440 K/uL  Troponin I     Status: None   Collection Time: 04/04/16  3:00 AM  Result Value Ref Range   Troponin I <0.03 <0.031 ng/mL    Comment:        NO INDICATION OF MYOCARDIAL INJURY.   Fibrin derivatives D-Dimer     Status: None   Collection Time: 04/04/16  3:00 AM  Result Value Ref Range   Fibrin derivatives D-dimer ([facility]) 333 0 - 499    Comment: <> Exclusion of Venous Thromboembolism (VTE) - OUTPATIENTS ONLY        (Emergency Department or [place])              0-499 ng/ml (FEU)  : With a low to intermediate pretest                                        probability for VTE this test result                                        excludes the diagnosis of VTE.           > 499 ng/ml (FEU)  : VTE not excluded.  Additional work up                                   for VTE is required.   <>  Testing on Inpatients and Evaluation of Disseminated Intravascular        Coagulation (DIC)             Reference Range:   0-499 ng/ml (FEU)   Troponin I     Status: None   Collection Time: 04/04/16  6:07 AM  Result Value Ref Range   Troponin I <0.03 <0.031 ng/mL    Comment:        NO INDICATION OF MYOCARDIAL INJURY.   TSH     Status: None   Collection Time: 04/04/16  6:07 AM  Result Value Ref Range   TSH 2.184 0.350 - 4.500 uIU/mL  Blood gas, venous     Status: Abnormal   Collection Time: 04/04/16  8:18 AM  Result Value Ref Range   pH, Ven 7.42 7.320 - 7.430   pCO2, Ven 34 (L) 44.0 - 60.0 mmHg   O2 Saturation 87.1 %   Patient temperature 37.0    Collection site VENOUS    Sample type VENOUS     Objective: General: Patient is awake, alert, and oriented x 3 and in no acute distress.  Integument: Skin is warm, dry and supple bilateral. Nails are tender, long, thickened and  dystrophic with subungual debris, consistent with onychomycosis, 1-5 bilateral. No signs of infection. Previous ingrown nail procedure sites well healed. No open lesions or preulcerative lesions present bilateral. Remaining integument unremarkable.  Vasculature:  Dorsalis Pedis pulse 2/4 bilateral. Posterior Tibial pulse  1/4 bilateral.  Capillary fill time <3 sec 1-5 bilateral. Positive hair growth to the level of the digits. Temperature gradient within normal limits. No varicosities present bilateral. No edema present bilateral.   Neurology: The patient has intact sensation measured with a 5.07/10g Semmes Weinstein Monofilament at all pedal sites bilateral .  Vibratory sensation intact bilateral with tuning fork. No Babinski sign present bilateral.   Musculoskeletal: No symptomatic pedal deformities noted bilateral. Muscular strength 5/5 in all lower extremity muscular groups bilateral without pain on range of motion. No tenderness with calf compression bilateral.  Assessment and Plan: Problem List Items Addressed This Visit    None    Visit Diagnoses    Dermatophytosis of nail    -  Primary    Diabetes mellitus without complication (HCC)        Toe pain, bilateral          -Examined patient. -Discussed and educated patient on diabetic foot care, especially with  regards to the vascular, neurological and musculoskeletal systems.  -Stressed the importance of good glycemic control and the detriment of not  controlling glucose levels in relation to the foot. -Mechanically debrided all nails 1-5 bilateral using sterile nail nipper and filed with dremel without incident  -Answered all patient questions -Patient to return  in 3 months for at risk foot care -Patient advised to call the office if any problems or questions arise in the meantime.  Landis Martins, DPM

## 2016-08-23 ENCOUNTER — Encounter: Payer: Self-pay | Admitting: Podiatry

## 2016-08-23 ENCOUNTER — Ambulatory Visit (INDEPENDENT_AMBULATORY_CARE_PROVIDER_SITE_OTHER): Payer: Medicare Other | Admitting: Podiatry

## 2016-08-23 DIAGNOSIS — B351 Tinea unguium: Secondary | ICD-10-CM

## 2016-08-23 DIAGNOSIS — E119 Type 2 diabetes mellitus without complications: Secondary | ICD-10-CM

## 2016-08-23 DIAGNOSIS — M79676 Pain in unspecified toe(s): Secondary | ICD-10-CM | POA: Diagnosis not present

## 2016-08-23 NOTE — Progress Notes (Signed)
SUBJECTIVE Patient with a history of diabetes mellitus presents to office today complaining of elongated, thickened nails. Pain while ambulating in shoes. Patient is unable to trim their own nails.   Patient does have a new complaint of discoloration to the left great toenail. Patient does have a history of partial no avulsions to that medial border of the left great toenail with a discoloration S. Allergies  Allergen Reactions  . Dilantin [Phenytoin Sodium Extended] Swelling  . Phenytoin Swelling    OBJECTIVE General Patient is awake, alert, and oriented x 3 and in no acute distress. Derm Skin is dry and supple bilateral. Negative open lesions or macerations. Remaining integument unremarkable. Nails are tender, long, thickened and dystrophic with subungual debris, consistent with onychomycosis, 1-5 bilateral. No signs of infection noted. Vasc  DP and PT pedal pulses palpable bilaterally. Temperature gradient within normal limits.  Neuro Epicritic and protective threshold sensation diminished bilaterally.  Musculoskeletal Exam No symptomatic pedal deformities noted bilateral. Muscular strength within normal limits.  ASSESSMENT 1. Diabetes Mellitus w/ peripheral neuropathy 2. Onychomycosis of nail due to dermatophyte bilateral 3. Pain in foot bilateral  PLAN OF CARE Patient evaluated today. Instructed to maintain good pedal hygiene and foot care. Stressed importance of controlling blood sugar.  Mechanical debridement of nails 1-5 bilaterally performed using a nail nipper. Filed with dremel without incident.  All patient questions were answered. Return to clinic in 3 mos.   Today we'll going to simply observe discoloration to the left great toenail in 4 months when she comes back we'll see if it is either growing out. He does appear worse we will take a biopsy  Edrick Kins, DPM

## 2016-09-14 ENCOUNTER — Emergency Department: Payer: Medicare Other

## 2016-09-14 ENCOUNTER — Encounter: Payer: Self-pay | Admitting: Emergency Medicine

## 2016-09-14 ENCOUNTER — Emergency Department
Admission: EM | Admit: 2016-09-14 | Discharge: 2016-09-14 | Disposition: A | Discharge status: Home / Self Care | Payer: Medicare Other | Attending: Emergency Medicine | Admitting: Emergency Medicine

## 2016-09-14 ENCOUNTER — Other Ambulatory Visit: Payer: Self-pay

## 2016-09-14 DIAGNOSIS — Z7984 Long term (current) use of oral hypoglycemic drugs: Secondary | ICD-10-CM | POA: Insufficient documentation

## 2016-09-14 DIAGNOSIS — I10 Essential (primary) hypertension: Secondary | ICD-10-CM | POA: Insufficient documentation

## 2016-09-14 DIAGNOSIS — Z7982 Long term (current) use of aspirin: Secondary | ICD-10-CM | POA: Insufficient documentation

## 2016-09-14 DIAGNOSIS — Z79899 Other long term (current) drug therapy: Secondary | ICD-10-CM | POA: Diagnosis not present

## 2016-09-14 DIAGNOSIS — E119 Type 2 diabetes mellitus without complications: Secondary | ICD-10-CM | POA: Insufficient documentation

## 2016-09-14 DIAGNOSIS — J069 Acute upper respiratory infection, unspecified: Secondary | ICD-10-CM | POA: Diagnosis not present

## 2016-09-14 DIAGNOSIS — B9789 Other viral agents as the cause of diseases classified elsewhere: Secondary | ICD-10-CM

## 2016-09-14 DIAGNOSIS — J449 Chronic obstructive pulmonary disease, unspecified: Secondary | ICD-10-CM | POA: Diagnosis not present

## 2016-09-14 DIAGNOSIS — J9801 Acute bronchospasm: Secondary | ICD-10-CM | POA: Insufficient documentation

## 2016-09-14 DIAGNOSIS — R05 Cough: Secondary | ICD-10-CM | POA: Diagnosis present

## 2016-09-14 LAB — BASIC METABOLIC PANEL
Anion gap: 11 (ref 5–15)
BUN: 8 mg/dL (ref 6–20)
CALCIUM: 9.6 mg/dL (ref 8.9–10.3)
CO2: 25 mmol/L (ref 22–32)
Chloride: 102 mmol/L (ref 101–111)
Creatinine, Ser: 0.62 mg/dL (ref 0.44–1.00)
GFR calc Af Amer: >60 mL/min (ref >60)
GLUCOSE: 220 mg/dL — AB (ref 65–99)
Potassium: 4.1 mmol/L (ref 3.5–5.1)
Sodium: 138 mmol/L (ref 135–145)

## 2016-09-14 LAB — CBC
HCT: 47.4 % — ABNORMAL HIGH (ref 35.0–47.0)
HEMOGLOBIN: 15.8 g/dL (ref 12.0–16.0)
MCH: 30.2 pg (ref 26.0–34.0)
MCHC: 33.4 g/dL (ref 32.0–36.0)
MCV: 90.5 fL (ref 80.0–100.0)
Platelets: 202 10*3/uL (ref 150–440)
RBC: 5.23 MIL/uL — ABNORMAL HIGH (ref 3.80–5.20)
RDW: 14.7 % — AB (ref 11.5–14.5)
WBC: 7.7 10*3/uL (ref 3.6–11.0)

## 2016-09-14 MED ORDER — ALBUTEROL SULFATE HFA 108 (90 BASE) MCG/ACT IN AERS: 2.0000 | INHALATION_SPRAY | Freq: Four times a day (QID) | RESPIRATORY_TRACT | 2 refills | Status: DC | PRN

## 2016-09-14 MED ORDER — IPRATROPIUM-ALBUTEROL 0.5-2.5 (3) MG/3ML IN SOLN
3.0000 mL | Freq: Once | RESPIRATORY_TRACT | Status: AC
  Administered 2016-09-14: 3 mL via RESPIRATORY_TRACT

## 2016-09-14 MED ORDER — PREDNISONE 50 MG PO TABS: 50.0000 mg | ORAL_TABLET | Freq: Every day | ORAL | 0 refills | Status: DC

## 2016-09-14 NOTE — ED Provider Notes (Signed)
[facility] Emergency Department Provider Note   ____________________________________________    I have reviewed the triage vital signs and the nursing notes.   HISTORY  Chief Complaint Cough     HPI Crystal Foley is a 65 y.o. female who presents with complaints of shortness of breath and cough. Patient reports over the last 2 days she has had worsening productive cough. Today she felt fatigued and slept and when she woke up she had difficulty catching her breath. She denies fevers or chills. She denies chest pain or pleurisy. She does report a history of COPD. She also has a history of diabetes.   Past Medical History:  Diagnosis Date  . Bilateral kidney stones   . COPD (chronic obstructive pulmonary disease) ([place])   . Diabetes mellitus   . Hematuria, gross   . Hydronephrosis   . Hypertension   . Seizures ([place])    last seizure 7-8 yrs ago  . Shortness of breath dyspnea    with exertion    Patient Active Problem List   Diagnosis Date Noted  . Encounter for general adult medical examination without abnormal findings 09/19/2015  . Calculus of kidney 06/01/2015  . Hypercholesterolemia without hypertriglyceridemia 09/27/2014  . Pure hypercholesterolemia 09/27/2014  . Adult BMI 30+ 09/25/2014  . Morbid obesity ([place]) 09/25/2014  . Airway hyperreactivity 05/15/2014  . Benign hypertension 05/15/2014  . HLD (hyperlipidemia) 05/15/2014  . Seizure ([place]) 05/15/2014  . Diabetes mellitus, type 2 ([place]) 05/15/2014  . Type 2 diabetes mellitus ([place]) 05/15/2014    Past Surgical History:  Procedure Laterality Date  . CYSTOSCOPY W/ URETERAL STENT PLACEMENT  03/11/15  . CYSTOSCOPY WITH RETROGRADE PYELOGRAM, URETEROSCOPY AND STENT PLACEMENT Left 03/17/2015   Procedure: CYSTOSCOPY, STENT REMOVAL, LEFT URETEROSCOPY  WITH STONE REMOVAL WITH BASKET;  Surgeon: Irine Seal, MD;  Location: [facility];  Service: Urology;  Laterality: Left;  . TUBAL LIGATION       Prior to Admission medications   Medication Sig Start Date End Date Taking? Authorizing Provider  albuterol (PROVENTIL HFA;VENTOLIN HFA) 108 (90 BASE) MCG/ACT inhaler Inhale 1 puff into the lungs every 6 (six) hours as needed for wheezing or shortness of breath.    Historical Provider, MD  aspirin EC 81 MG tablet Take 81 mg by mouth daily.  01/08/16 01/07/17  Historical Provider, MD  atorvastatin (LIPITOR) 40 MG tablet Take 40 mg by mouth daily.    Historical Provider, MD  divalproex (DEPAKOTE ER) 250 MG 24 hr tablet Take 500-750 mg by mouth 2 (two) times daily. Take 500mg  in morning and take 750mg  at night    Historical Provider, MD  fluticasone (FLONASE) 50 MCG/ACT nasal spray Place 2 sprays into both nostrils daily as needed for allergies or rhinitis.    Historical Provider, MD  glimepiride (AMARYL) 2 MG tablet Take 2 mg by mouth daily with breakfast.    Historical Provider, MD  losartan (COZAAR) 50 MG tablet Take 50 mg by mouth every morning.    Historical Provider, MD  metFORMIN (GLUCOPHAGE-XR) 500 MG 24 hr tablet Take 2,000 mg by mouth daily with supper.     Historical Provider, MD  promethazine (PHENERGAN) 25 MG tablet Take 25 mg by mouth every 6 (six) hours as needed for nausea or vomiting.    Historical Provider, MD     Allergies Dilantin [phenytoin sodium extended] and Phenytoin  Family History  Problem Relation Age of Onset  . Coronary artery disease Sister   . Brain cancer  Father   . Emphysema Mother     Social History Social History  Substance Use Topics  . Smoking status: Never Smoker  . Smokeless tobacco: Not on file  . Alcohol use No    Review of Systems  Constitutional: No fever/chills  ENT: No sore throat. Cardiovascular: Denies chest pain. Respiratory: As above Gastrointestinal: No abdominal pain.  No nausea, no vomiting.   Genitourinary: Negative for dysuria.  Skin: Negative for rash. Neurological: Negative for headaches   10-point ROS otherwise  negative.  ____________________________________________   PHYSICAL EXAM:  VITAL SIGNS: ED Triage Vitals [09/14/16 1551]  Enc Vitals Group     BP (!) 147/91     Pulse Rate (!) 122     Resp (!) 26     Temp 98 F (36.7 C)     Temp Source Oral     SpO2 100 %     Weight 192 lb (87.1 kg)     Height 5\' 4"  (1.626 m)     Head Circumference      Peak Flow      Pain Score      Pain Loc      Pain Edu?      Excl. in [place]?     Constitutional: Alert and oriented. No acute distress. Pleasant and interactive Eyes: Conjunctivae are normal.   Nose: No congestion/rhinnorhea. Mouth/Throat: Mucous membranes are moist.    Cardiovascular: Mild tachycardia regular rhythm. Grossly normal heart sounds.  Good peripheral circulation. Respiratory: Normal respiratory effort.  No retractions. Scattered wheezes. Gastrointestinal: Soft and nontender. No distention.  No CVA tenderness. Genitourinary: deferred Musculoskeletal: No lower extremity tenderness nor edema.  Warm and well perfused Neurologic:  Normal speech and language. No gross focal neurologic deficits are appreciated.  Skin:  Skin is warm, dry and intact. No rash noted. Psychiatric: Mood and affect are normal. Speech and behavior are normal.  ____________________________________________   LABS (all labs ordered are listed, but only abnormal results are displayed)  Labs Reviewed  BASIC METABOLIC PANEL - Abnormal; Notable for the following:       Result Value   Glucose, Bld 220 (*)    All other components within normal limits  CBC - Abnormal; Notable for the following:    RBC 5.23 (*)    HCT 47.4 (*)    RDW 14.7 (*)    All other components within normal limits   ____________________________________________  EKG  ED ECG REPORT I, Lavonia Drafts, the attending physician, personally viewed and interpreted this ECG.  Date: 09/14/2016 EKG Time: 3:38 PM Rate: 119 Rhythm: Sinus tachycardia QRS Axis: normal Intervals: normal ST/T  Wave abnormalities: normal Conduction Disturbances: none Narrative Interpretation: unremarkable  ____________________________________________  RADIOLOGY  Chest x-ray normal ____________________________________________   PROCEDURES  Procedure(s) performed: No    Critical Care performed: No ____________________________________________   INITIAL IMPRESSION / ASSESSMENT AND PLAN / ED COURSE  Pertinent labs & imaging results that were available during my care of the patient were reviewed by me and considered in my medical decision making (see chart for details).  Patient reports feeling better after albuterol treatment. I suspect upper respiratory viral infection caused bronchospasm. No pleurisy or chest pain, no history of DVT or PE. We will ambulate the patient and recheck heart rate. She is overall very well-appearing  Clinical Course  Patient ambulated well and feels much better. Her heart rate is still slightly elevated from the albuterol. She feels quite comfortable going home and she is to return if  any worsening of her condition. ____________________________________________   FINAL CLINICAL IMPRESSION(S) / ED DIAGNOSES  Final diagnoses:  Viral URI with cough  Bronchospasm      NEW MEDICATIONS STARTED DURING THIS VISIT:  New Prescriptions   No medications on file     Note:  This document was prepared using Dragon voice recognition software and may include unintentional dictation errors.    Lavonia Drafts, MD 09/14/16 2000

## 2016-09-14 NOTE — ED Triage Notes (Signed)
Pt c/o productive cough, runny nose, and eyes watering since Wednesday. Pt has tried claritan without relief. Reports she got her flu/shingles vaccine Wednesday and is not sure if she has having a reaction to one of these. Reports her right arm got a little red and warm after the flu shot.

## 2016-09-14 NOTE — ED Triage Notes (Addendum)
Pt now reports she is having increased SHOB with this as well and has been tired. tachypenic in triage but not labored at this time. Hx copd

## 2016-11-14 ENCOUNTER — Emergency Department
Admission: EM | Admit: 2016-11-14 | Discharge: 2016-11-14 | Disposition: A | Discharge status: Home / Self Care | Payer: Medicare Other | Attending: Emergency Medicine | Admitting: Emergency Medicine

## 2016-11-14 ENCOUNTER — Encounter: Payer: Self-pay | Admitting: Emergency Medicine

## 2016-11-14 DIAGNOSIS — R55 Syncope and collapse: Secondary | ICD-10-CM | POA: Diagnosis present

## 2016-11-14 DIAGNOSIS — I10 Essential (primary) hypertension: Secondary | ICD-10-CM | POA: Insufficient documentation

## 2016-11-14 DIAGNOSIS — Z7984 Long term (current) use of oral hypoglycemic drugs: Secondary | ICD-10-CM | POA: Insufficient documentation

## 2016-11-14 DIAGNOSIS — J449 Chronic obstructive pulmonary disease, unspecified: Secondary | ICD-10-CM | POA: Diagnosis not present

## 2016-11-14 DIAGNOSIS — Z79899 Other long term (current) drug therapy: Secondary | ICD-10-CM | POA: Diagnosis not present

## 2016-11-14 DIAGNOSIS — Z7982 Long term (current) use of aspirin: Secondary | ICD-10-CM | POA: Insufficient documentation

## 2016-11-14 DIAGNOSIS — E119 Type 2 diabetes mellitus without complications: Secondary | ICD-10-CM | POA: Insufficient documentation

## 2016-11-14 LAB — BASIC METABOLIC PANEL
Anion gap: 7 (ref 5–15)
BUN: 10 mg/dL (ref 6–20)
CHLORIDE: 102 mmol/L (ref 101–111)
CO2: 28 mmol/L (ref 22–32)
CREATININE: 0.69 mg/dL (ref 0.44–1.00)
Calcium: 9.4 mg/dL (ref 8.9–10.3)
Glucose, Bld: 178 mg/dL — ABNORMAL HIGH (ref 65–99)
Potassium: 4.2 mmol/L (ref 3.5–5.1)
SODIUM: 137 mmol/L (ref 135–145)

## 2016-11-14 LAB — URINALYSIS COMPLETE WITH MICROSCOPIC (ARMC ONLY)
BILIRUBIN URINE: NEGATIVE
Glucose, UA: >500 mg/dL — AB
NITRITE: NEGATIVE
PH: 6 (ref 5.0–8.0)
PROTEIN: NEGATIVE mg/dL
SPECIFIC GRAVITY, URINE: 1.035 — AB (ref 1.005–1.030)

## 2016-11-14 LAB — CBC
HCT: 44.2 % (ref 35.0–47.0)
Hemoglobin: 14.9 g/dL (ref 12.0–16.0)
MCH: 30.1 pg (ref 26.0–34.0)
MCHC: 33.8 g/dL (ref 32.0–36.0)
MCV: 89.2 fL (ref 80.0–100.0)
PLATELETS: 196 10*3/uL (ref 150–440)
RBC: 4.96 MIL/uL (ref 3.80–5.20)
RDW: 14.4 % (ref 11.5–14.5)
WBC: 7.4 10*3/uL (ref 3.6–11.0)

## 2016-11-14 LAB — TROPONIN I: Troponin I: <0.03 ng/mL (ref <0.03)

## 2016-11-14 LAB — GLUCOSE, CAPILLARY: Glucose-Capillary: 163 mg/dL — ABNORMAL HIGH (ref 65–99)

## 2016-11-14 MED ORDER — NITROFURANTOIN MONOHYD MACRO 100 MG PO CAPS: 100.0000 mg | ORAL_CAPSULE | Freq: Two times a day (BID) | ORAL | 0 refills | Status: DC

## 2016-11-14 NOTE — ED Provider Notes (Signed)
[facility] Emergency Department Provider Note        Time seen: ----------------------------------------- 4:10 PM on 11/14/2016 -----------------------------------------    I have reviewed the triage vital signs and the nursing notes.   HISTORY  Chief Complaint Near Syncope    HPI Crystal Foley is a 65 y.o. female who presents to the ER for near syncopal or syncopal event that was very brief while and exercise class this morning. Patient currently exercises at the nursing home she lives in. Patient states she went home and drink lots of water and ate lunch and drink more water and now she feels better. She denies fevers, chills, chest pain, shortness of breath, vomiting or diarrhea. She did have a recent increase in one of her blood sugar medications but otherwise denies medication changes. She states after the event she felt like her heart was racing.   Past Medical History:  Diagnosis Date  . Bilateral kidney stones   . COPD (chronic obstructive pulmonary disease) ([place])   . Diabetes mellitus   . Hematuria, gross   . Hydronephrosis   . Hypertension   . Seizures ([place])    last seizure 7-8 yrs ago  . Shortness of breath dyspnea    with exertion    Patient Active Problem List   Diagnosis Date Noted  . Encounter for general adult medical examination without abnormal findings 09/19/2015  . Calculus of kidney 06/01/2015  . Hypercholesterolemia without hypertriglyceridemia 09/27/2014  . Pure hypercholesterolemia 09/27/2014  . Adult BMI 30+ 09/25/2014  . Morbid obesity ([place]) 09/25/2014  . Airway hyperreactivity 05/15/2014  . Benign hypertension 05/15/2014  . HLD (hyperlipidemia) 05/15/2014  . Seizure ([place]) 05/15/2014  . Diabetes mellitus, type 2 ([place]) 05/15/2014  . Type 2 diabetes mellitus ([place]) 05/15/2014    Past Surgical History:  Procedure Laterality Date  . CYSTOSCOPY W/ URETERAL STENT PLACEMENT  03/11/15  . CYSTOSCOPY WITH RETROGRADE  PYELOGRAM, URETEROSCOPY AND STENT PLACEMENT Left 03/17/2015   Procedure: CYSTOSCOPY, STENT REMOVAL, LEFT URETEROSCOPY  WITH STONE REMOVAL WITH BASKET;  Surgeon: Irine Seal, MD;  Location: [facility];  Service: Urology;  Laterality: Left;  . TUBAL LIGATION      Allergies Dilantin [phenytoin sodium extended] and Phenytoin  Social History Social History  Substance Use Topics  . Smoking status: Never Smoker  . Smokeless tobacco: Never Used  . Alcohol use No    Review of Systems Constitutional: Negative for fever. Cardiovascular: Negative for chest pain. Respiratory: Negative for shortness of breath. Gastrointestinal: Negative for abdominal pain, vomiting and diarrhea. Genitourinary: Negative for dysuria. Musculoskeletal: Negative for back pain. Skin: Negative for rash. Neurological: Negative for headaches, focal weakness or numbness.  10-point ROS otherwise negative.  ____________________________________________   PHYSICAL EXAM:  VITAL SIGNS: ED Triage Vitals  Enc Vitals Group     BP 11/14/16 1423 129/66     Pulse Rate 11/14/16 1423 95     Resp 11/14/16 1423 18     Temp 11/14/16 1423 98.5 F (36.9 C)     Temp Source 11/14/16 1423 Oral     SpO2 11/14/16 1423 98 %     Weight 11/14/16 1424 192 lb (87.1 kg)     Height --      Head Circumference --      Peak Flow --      Pain Score --      Pain Loc --      Pain Edu? --      Excl. in [place]? --  Constitutional: Alert and oriented. Well appearing and in no distress. Eyes: Conjunctivae are normal. PERRL. Normal extraocular movements. ENT   Head: Normocephalic and atraumatic.   Nose: No congestion/rhinnorhea.   Mouth/Throat: Mucous membranes are moist.   Neck: No stridor. Cardiovascular: Normal rate, regular rhythm. No murmurs, rubs, or gallops. Respiratory: Normal respiratory effort without tachypnea nor retractions. Breath sounds are clear and equal bilaterally. No wheezes/rales/rhonchi. Gastrointestinal: Soft  and nontender. Normal bowel sounds Musculoskeletal: Nontender with normal range of motion in all extremities. No lower extremity tenderness nor edema. Neurologic:  Normal speech and language. No gross focal neurologic deficits are appreciated.  Skin:  Skin is warm, dry and intact. No rash noted. Psychiatric: Mood and affect are normal. Speech and behavior are normal.  ____________________________________________  EKG: Interpreted by me.Sinus rhythm with a rate of 92 bpm, normal PR interval, normal QRS, normal QT, normal axis.  ____________________________________________  ED COURSE:  Pertinent labs & imaging results that were available during my care of the patient were reviewed by me and considered in my medical decision making (see chart for details). Clinical Course   Patient is in no acute distress, we will assess with labs and reevaluate.  Procedures ____________________________________________   LABS (pertinent positives/negatives)  Labs Reviewed  BASIC METABOLIC PANEL - Abnormal; Notable for the following:       Result Value   Glucose, Bld 178 (*)    All other components within normal limits  URINALYSIS COMPLETEWITH MICROSCOPIC ([facility] ONLY) - Abnormal; Notable for the following:    Color, Urine YELLOW (*)    APPearance HAZY (*)    Glucose, UA >500 (*)    Ketones, ur TRACE (*)    Specific Gravity, Urine 1.035 (*)    Hgb urine dipstick 1+ (*)    Leukocytes, UA 2+ (*)    Bacteria, UA FEW (*)    Squamous Epithelial / LPF 6-30 (*)    All other components within normal limits  GLUCOSE, CAPILLARY - Abnormal; Notable for the following:    Glucose-Capillary 163 (*)    All other components within normal limits  CBC  TROPONIN I  CBG MONITORING, ED   ____________________________________________  FINAL ASSESSMENT AND PLAN  Syncope  Plan: Patient with labs and imaging as dictated above. Patient was borderline orthostatic and dehydrated here. Otherwise her testing was  unremarkable. Patient did not want to have an IV placed this time, advised otherwise her testing was within normal limits. She can follow-up as an outpatient with her doctor.   Earleen Newport, MD   Note: This dictation was prepared with Dragon dictation. Any transcriptional errors that result from this process are unintentional    Earleen Newport, MD 11/14/16 [phone]

## 2016-11-14 NOTE — ED Triage Notes (Signed)
Pt c/o near syncope while in exercise class this am. Pt states she then went home and drank lots of water and she feels better now.

## 2016-12-04 ENCOUNTER — Encounter: Payer: Self-pay | Admitting: Urology

## 2016-12-04 ENCOUNTER — Ambulatory Visit
Admission: RE | Admit: 2016-12-04 | Discharge: 2016-12-04 | Disposition: A | Discharge status: Home / Self Care | Payer: Medicare Other | Source: Ambulatory Visit | Attending: Urology | Admitting: Urology

## 2016-12-04 ENCOUNTER — Ambulatory Visit (INDEPENDENT_AMBULATORY_CARE_PROVIDER_SITE_OTHER): Payer: Medicare Other | Admitting: Urology

## 2016-12-04 VITALS — BP 160/96 | HR 96 | Ht 64.0 in | Wt 190.0 lb

## 2016-12-04 DIAGNOSIS — Z87442 Personal history of urinary calculi: Secondary | ICD-10-CM | POA: Diagnosis present

## 2016-12-04 DIAGNOSIS — N952 Postmenopausal atrophic vaginitis: Secondary | ICD-10-CM

## 2016-12-04 DIAGNOSIS — N3941 Urge incontinence: Secondary | ICD-10-CM

## 2016-12-04 DIAGNOSIS — N816 Rectocele: Secondary | ICD-10-CM

## 2016-12-04 DIAGNOSIS — N393 Stress incontinence (female) (male): Secondary | ICD-10-CM | POA: Diagnosis not present

## 2016-12-04 LAB — BLADDER SCAN AMB NON-IMAGING: Scan Result: 78

## 2016-12-04 NOTE — Progress Notes (Signed)
12/04/2016 3:18 PM   Crystal Foley 03-07-1951 [id number]  Referring provider: [name], MD [facility] [place], [place] [address]  Chief Complaint  Patient presents with  . Follow-up    HPI: Mrs. Gunnison is a 65 y/o Caucasian female who presents today to for symptoms of frequency, urgency and incontinence.  Patient also has a history of nephrolithiasis.  Patient is having a very great deal of an uncomfortable urge to urinate and urine loss associated with a strong desire to urinate.  She is bothered a very great deal with a sudden urge to urinate, accidental loss of small amounts of urine, night time urination and waking up at night to urinate.    She is having to use the restroom 12 to 14 times daily.  She is having 1 episode at night.  She is having urge incontinence and stress incontinence.  She is wearing three depends daily.    She has not had recurrent UTI's.    She is having leakage during sex and has not had sex for three months.  She is not going to Christmas parties due to the leakage.      Patient had a bladder tacking 20 years ago and was very satisfied with those results  Patient has a history of stones.  Patient was seen at [facility] ED on 03/12/2015 for a 9 mm left UPJ stone with intractable pain and vomiting.  Dr. Jeffie Pollock placed a left ureteral stent at that time and she followed up at [facility] for definitive treatment of the stone.  Dr. Jeffie Pollock performed a left ureteroscopic stone extraction and cystoscopy with removal of left double-J stent on 03/17/2015.  RUS on 06/14/2015 noted no hydronephrosis and no stones.  24 hour urine results indicate a low urinary volume and increased oxalates.     PMH: Past Medical History:  Diagnosis Date  . Bilateral kidney stones   . COPD (chronic obstructive pulmonary disease) ([place])   . Diabetes mellitus   . Hematuria, gross   . Hydronephrosis   . Hypertension   . Seizures ([place])    last  seizure 7-8 yrs ago  . Shortness of breath dyspnea    with exertion    Surgical History: Past Surgical History:  Procedure Laterality Date  . CYSTOSCOPY W/ URETERAL STENT PLACEMENT  03/11/15  . CYSTOSCOPY WITH RETROGRADE PYELOGRAM, URETEROSCOPY AND STENT PLACEMENT Left 03/17/2015   Procedure: CYSTOSCOPY, STENT REMOVAL, LEFT URETEROSCOPY  WITH STONE REMOVAL WITH BASKET;  Surgeon: Irine Seal, MD;  Location: [facility];  Service: Urology;  Laterality: Left;  . TUBAL LIGATION      Home Medications:  Allergies as of 12/04/2016      Reactions   Dilantin [phenytoin Sodium Extended] Swelling   Phenytoin Swelling      Medication List       Accurate as of 12/04/16  3:18 PM. Always use your most recent med list.          albuterol 108 (90 Base) MCG/ACT inhaler Commonly known as:  PROVENTIL HFA;VENTOLIN HFA Inhale 2 puffs into the lungs every 6 (six) hours as needed for wheezing or shortness of breath.   albuterol 108 (90 Base) MCG/ACT inhaler Commonly known as:  PROVENTIL HFA;VENTOLIN HFA Inhale 1 puff into the lungs every 6 (six) hours as needed for wheezing or shortness of breath.   aspirin EC 81 MG tablet Take 81 mg by mouth daily.   atorvastatin 40 MG tablet Commonly  known as:  LIPITOR Take 40 mg by mouth daily.   divalproex 250 MG 24 hr tablet Commonly known as:  DEPAKOTE ER Take 500-750 mg by mouth 2 (two) times daily. Take 500mg  in morning and take 750mg  at night   empagliflozin 25 MG Tabs tablet Commonly known as:  JARDIANCE Take by mouth.   fluticasone 50 MCG/ACT nasal spray Commonly known as:  FLONASE Place 2 sprays into both nostrils daily as needed for allergies or rhinitis.   glimepiride 2 MG tablet Commonly known as:  AMARYL Take 2 mg by mouth daily with breakfast.   losartan 50 MG tablet Commonly known as:  COZAAR Take 50 mg by mouth every morning.   metFORMIN 500 MG 24 hr tablet Commonly known as:  GLUCOPHAGE-XR Take 2,000 mg by mouth daily with  supper.   predniSONE 50 MG tablet Commonly known as:  DELTASONE Take 1 tablet (50 mg total) by mouth daily with breakfast.   promethazine 25 MG tablet Commonly known as:  PHENERGAN Take 25 mg by mouth every 6 (six) hours as needed for nausea or vomiting.       Allergies:  Allergies  Allergen Reactions  . Dilantin [Phenytoin Sodium Extended] Swelling  . Phenytoin Swelling    Family History: Family History  Problem Relation Age of Onset  . Coronary artery disease Sister   . Brain cancer Father   . Emphysema Mother     Social History:  reports that she has never smoked. She has never used smokeless tobacco. She reports that she does not drink alcohol or use drugs.  ROS: Urological Symptom Review UROLOGY Frequent Urination?: Yes Hard to postpone urination?: Yes Burning/pain with urination?: No Get up at night to urinate?: Yes Leakage of urine?: Yes Urine stream starts and stops?: No Trouble starting stream?: No Do you have to strain to urinate?: No Blood in urine?: No Urinary tract infection?: No Sexually transmitted disease?: No Injury to kidneys or bladder?: No Painful intercourse?: No Weak stream?: No Currently pregnant?: No Vaginal bleeding?: No Last menstrual period?: n Gastrointestinal Nausea?: No Vomiting?: No Indigestion/heartburn?: No Diarrhea?: No Constipation?: No Constitutional Fever: No Night sweats?: No Weight loss?: No Fatigue?: No Skin Skin rash/lesions?: No Itching?: Yes Eyes Blurred vision?: No Double vision?: No Ears/Nose/Throat Sore throat?: No Sinus problems?: No Hematologic/Lymphatic Swollen glands?: No Easy bruising?: No Cardiovascular Leg swelling?: No Chest pain?: No Respiratory Cough?: No Shortness of breath?: No Endocrine Excessive thirst?: No Musculoskeletal Back pain?: No Joint pain?: No Neurological Headaches?: No Dizziness?: No Psychologic Depression?: No Anxiety?: No    Physical Exam: BP (!)  160/96   Pulse 96   Ht 5\' 4"  (1.626 m)   Wt 190 lb (86.2 kg)   BMI 32.61 kg/m   Constitutional: Well nourished. Alert and oriented, No acute distress. HEENT: [place] AT, moist mucus membranes. Trachea midline, no masses. Cardiovascular: No clubbing, cyanosis, or edema. Respiratory: Normal respiratory effort, no increased work of breathing. GI: Abdomen is soft, non tender, non distended, no abdominal masses. Liver and spleen not palpable.  No hernias appreciated.  Stool sample for occult testing is not indicated.   GU: No CVA tenderness.  No bladder fullness or masses.  Atrophic external genitalia, normal pubic hair distribution, no lesions.  Normal urethral meatus, no lesions, no prolapse, no discharge.   No urethral masses, tenderness and/or tenderness. No bladder fullness, tenderness or masses.  Pale vagina mucosa, poor estrogen effect, no discharge, no lesions, good pelvic support, Grade II cystocele is noted.  Rectocele is noted.  No cervical motion tenderness.  Uterus is freely mobile and non-fixed.  No adnexal/parametria masses or tenderness noted.  Anus and perineum are without rashes or lesions.    Skin: No rashes, bruises or suspicious lesions. Lymph: No cervical or inguinal adenopathy. Neurologic: Grossly intact, no focal deficits, moving all 4 extremities. Psychiatric: Normal mood and affect.   Laboratory Data: Lab Results  Component Value Date   WBC 7.4 11/14/2016   HGB 14.9 11/14/2016   HCT 44.2 11/14/2016   MCV 89.2 11/14/2016   PLT 196 11/14/2016    Lab Results  Component Value Date   CREATININE 0.69 11/14/2016       Assessment & Plan:    1. Urge incontinence  - offered behavioral therapies, bladder training, bladder control strategies and pelvic floor muscle training- patient declined  - fluid management   - would like to try the beta-3 adrenergic receptor agonist (Myrbetriq).  Given Myrbetriq 25 mg samples, #28.  I have reviewed with the patient of the side  effects of Myrbetriq, such as: elevation in BP, urinary retention and/or HA.  She will return in one month for PVR and symptom recheck.    - explained that surgery does not address urge incontinence and may worsen the urgency  2. Stress incontinence  - patient would like an appointment with one of our surgeon to discuss if she is a candidate for surgery  - scheduled an appointment with Dr. Matilde Sprang  3. Vaginal atrophy  - I explained to the patient that when women go through menopause and her estrogen levels are severely diminished, the normal vaginal mucosa.     -Patient was given a sample of vaginal estrogen cream (Estrace) and instructed to apply 0.5mg  (pea-sized amount)  just inside the vaginal introitus with a finger-tip every night for two weeks and then Monday, Wednesday and Friday nights.  I explained to the patient that vaginally administered estrogen, which causes only a slight increase in the blood estrogen levels, have fewer contraindications and adverse systemic effects that oral HT.  4. Rectocele  - see above  5. History of nephrolithiasis  - obtain a KUB today  - will contact the patient with results  No Follow-up on file.  [name], [facility] [address] [place], [place] [address] [phone]

## 2016-12-05 ENCOUNTER — Telehealth: Payer: Self-pay

## 2016-12-05 NOTE — Telephone Encounter (Signed)
-----   Message from Nori Riis, PA-C sent at 12/05/2016  8:04 AM EST ----- Please let the patient know that her KUB did not show any stones.

## 2016-12-05 NOTE — Telephone Encounter (Signed)
LMOM

## 2016-12-05 NOTE — Telephone Encounter (Signed)
Spoke with pt in reference to KUB results. Pt voiced understanding.  

## 2016-12-20 ENCOUNTER — Ambulatory Visit (INDEPENDENT_AMBULATORY_CARE_PROVIDER_SITE_OTHER): Payer: Medicare Other | Admitting: Podiatry

## 2016-12-20 DIAGNOSIS — E0843 Diabetes mellitus due to underlying condition with diabetic autonomic (poly)neuropathy: Secondary | ICD-10-CM

## 2016-12-20 DIAGNOSIS — B351 Tinea unguium: Secondary | ICD-10-CM

## 2016-12-20 DIAGNOSIS — M79609 Pain in unspecified limb: Secondary | ICD-10-CM | POA: Diagnosis not present

## 2016-12-20 DIAGNOSIS — L608 Other nail disorders: Secondary | ICD-10-CM

## 2016-12-20 DIAGNOSIS — L603 Nail dystrophy: Secondary | ICD-10-CM

## 2016-12-20 NOTE — Progress Notes (Signed)
SUBJECTIVE Patient with a history of diabetes mellitus presents to office today complaining of elongated, thickened nails. Pain while ambulating in shoes. Patient is unable to trim their own nails.   Allergies  Allergen Reactions  . Dilantin [Phenytoin Sodium Extended] Swelling  . Phenytoin Swelling    OBJECTIVE General Patient is awake, alert, and oriented x 3 and in no acute distress. Derm Skin is dry and supple bilateral. Negative open lesions or macerations. Remaining integument unremarkable. Nails are tender, long, thickened and dystrophic with subungual debris, consistent with onychomycosis, 1-5 bilateral. No signs of infection noted. Vasc  DP and PT pedal pulses palpable bilaterally. Temperature gradient within normal limits.  Neuro Epicritic and protective threshold sensation diminished bilaterally.  Musculoskeletal Exam No symptomatic pedal deformities noted bilateral. Muscular strength within normal limits.  ASSESSMENT 1. Diabetes Mellitus w/ peripheral neuropathy 2. Onychomycosis of nail due to dermatophyte bilateral 3. Pain in foot bilateral  PLAN OF CARE 1. Patient evaluated today. 2. Instructed to maintain good pedal hygiene and foot care. Stressed importance of controlling blood sugar.  3. Mechanical debridement of nails 1-5 bilaterally performed using a nail nipper. Filed with dremel without incident.  4. Return to clinic in 3 mos.     Edrick Kins, DPM [facility]  Dr. Edrick Kins, [address]                                        [place], [place] [address]                Office [phone]  Fax [phone]

## 2016-12-30 ENCOUNTER — Ambulatory Visit (INDEPENDENT_AMBULATORY_CARE_PROVIDER_SITE_OTHER): Payer: Medicare Other | Admitting: Urology

## 2016-12-30 ENCOUNTER — Encounter: Payer: Self-pay | Admitting: Urology

## 2016-12-30 VITALS — BP 127/83 | HR 108 | Ht 64.0 in | Wt 189.6 lb

## 2016-12-30 DIAGNOSIS — N3946 Mixed incontinence: Secondary | ICD-10-CM | POA: Diagnosis not present

## 2016-12-30 DIAGNOSIS — N3941 Urge incontinence: Secondary | ICD-10-CM | POA: Diagnosis not present

## 2016-12-30 LAB — BLADDER SCAN AMB NON-IMAGING: SCAN RESULT: 98

## 2016-12-30 MED ORDER — MIRABEGRON ER 50 MG PO TB24: 50.0000 mg | ORAL_TABLET | Freq: Every day | ORAL | 11 refills | Status: DC

## 2016-12-30 NOTE — Progress Notes (Signed)
12/30/2016 10:06 AM   Crystal Foley 1951-10-20 [id number]  Referring provider: [name], MD [facility] [place], [place] [address]  Chief Complaint  Patient presents with  . Urinary Incontinence    HPI: The patient has been assessed by [name] with urge incontinence and frequency and leakage associated with intercourse.  She had a bladder suspension many years ago. She has had a kidney stone. She was given estrogen cream for vaginal atrophy. She was wondering if surgery would help her. She was given the beta 3 agonist 25 mg.  Today The patient has worsening incontinence over the last 6 months. She has urge incontinence. She leaks with coughing sneezing and lifting heavy and exercise. Both were significant. She could wear 15 pads a day moderately wet or greater. She denied bedwetting but had foot on the floor syndrome.  She was getting up 5 or 6 times a night and denies ankle edema. She was voiding every 2 hours. She is on oral hypoglycemics. She has not had a hysterectomy.  She is now 80% better wearing 3 pads a day with much milder leakage. Having said that she has high treatment goals in regard to stress incontinence and getting back to exercise. She wants to be as dry as possible  The patient does leak with intercourse and I believe they are triggering bladder overactivity. She does not have prolapse symptoms. She also describes he in the dorsal syndrome that is significant she arrives home  Modifying factors: There are no other modifying factors  Associated signs and symptoms: There are no other associated signs and symptoms Aggravating and relieving factors: There are no other aggravating or relieving factors Severity: Moderate Duration: Persistent but improved   PMH: Past Medical History:  Diagnosis Date  . Bilateral kidney stones   . COPD (chronic obstructive pulmonary disease) ([place])   . Diabetes mellitus   . Hematuria, gross    . Hydronephrosis   . Hypertension   . Seizures ([place])    last seizure 7-8 yrs ago  . Shortness of breath dyspnea    with exertion    Surgical History: Past Surgical History:  Procedure Laterality Date  . CYSTOSCOPY W/ URETERAL STENT PLACEMENT  03/11/15  . CYSTOSCOPY WITH RETROGRADE PYELOGRAM, URETEROSCOPY AND STENT PLACEMENT Left 03/17/2015   Procedure: CYSTOSCOPY, STENT REMOVAL, LEFT URETEROSCOPY  WITH STONE REMOVAL WITH BASKET;  Surgeon: Irine Seal, MD;  Location: [facility];  Service: Urology;  Laterality: Left;  . TUBAL LIGATION      Home Medications:  Allergies as of 12/30/2016      Reactions   Dilantin [phenytoin Sodium Extended] Swelling   Phenytoin Swelling      Medication List       Accurate as of 12/30/16 10:06 AM. Always use your most recent med list.          albuterol 108 (90 Base) MCG/ACT inhaler Commonly known as:  PROVENTIL HFA;VENTOLIN HFA Inhale 2 puffs into the lungs every 6 (six) hours as needed for wheezing or shortness of breath.   albuterol 108 (90 Base) MCG/ACT inhaler Commonly known as:  PROVENTIL HFA;VENTOLIN HFA Inhale 1 puff into the lungs every 6 (six) hours as needed for wheezing or shortness of breath.   aspirin EC 81 MG tablet Take 81 mg by mouth daily.   atorvastatin 40 MG tablet Commonly known as:  LIPITOR Take 40 mg by mouth daily.   divalproex 250 MG 24 hr tablet Commonly known  as:  DEPAKOTE ER Take 500-750 mg by mouth 2 (two) times daily. Take 500mg  in morning and take 750mg  at night   empagliflozin 25 MG Tabs tablet Commonly known as:  JARDIANCE Take by mouth.   fluticasone 50 MCG/ACT nasal spray Commonly known as:  FLONASE Place 2 sprays into both nostrils daily as needed for allergies or rhinitis.   glimepiride 2 MG tablet Commonly known as:  AMARYL Take 2 mg by mouth daily with breakfast.   losartan 50 MG tablet Commonly known as:  COZAAR Take 50 mg by mouth every morning.   metFORMIN 500 MG 24 hr tablet Commonly  known as:  GLUCOPHAGE-XR Take 2,000 mg by mouth daily with supper.   mirabegron ER 25 MG Tb24 tablet Commonly known as:  MYRBETRIQ Take by mouth.       Allergies:  Allergies  Allergen Reactions  . Dilantin [Phenytoin Sodium Extended] Swelling  . Phenytoin Swelling    Family History: Family History  Problem Relation Age of Onset  . Coronary artery disease Sister   . Brain cancer Father   . Emphysema Mother     Social History:  reports that she has never smoked. She has never used smokeless tobacco. She reports that she does not drink alcohol or use drugs.  ROS: UROLOGY Frequent Urination?: Yes Hard to postpone urination?: Yes Burning/pain with urination?: No Get up at night to urinate?: Yes Leakage of urine?: Yes Urine stream starts and stops?: No Trouble starting stream?: No Do you have to strain to urinate?: No Blood in urine?: No Urinary tract infection?: No Sexually transmitted disease?: No Injury to kidneys or bladder?: No Painful intercourse?: No Weak stream?: No Currently pregnant?: No Vaginal bleeding?: No Last menstrual period?: n  Gastrointestinal Nausea?: No Vomiting?: No Indigestion/heartburn?: No Diarrhea?: No Constipation?: No  Constitutional Fever: No Night sweats?: No Weight loss?: No Fatigue?: No  Skin Skin rash/lesions?: No Itching?: No  Eyes Blurred vision?: No Double vision?: No  Ears/Nose/Throat Sore throat?: No Sinus problems?: No  Hematologic/Lymphatic Swollen glands?: No Easy bruising?: No  Cardiovascular Leg swelling?: No Chest pain?: No  Respiratory Cough?: No Shortness of breath?: No  Endocrine Excessive thirst?: No  Musculoskeletal Back pain?: No Joint pain?: No  Neurological Headaches?: No Dizziness?: No  Psychologic Depression?: No Anxiety?: No  Physical Exam: BP 127/83 (BP Location: Left Arm, Patient Position: Sitting, Cuff Size: Large)   Pulse (!) 108   Ht 5\' 4"  (1.626 m)   Wt 189 lb  9.6 oz (86 kg)   BMI 32.54 kg/m   Constitutional:  Alert and oriented, No acute distress. HEENT: [place] AT, moist mucus membranes.  Trachea midline, no masses. Cardiovascular: No clubbing, cyanosis, or edema. Respiratory: Normal respiratory effort, no increased work of breathing. GI: Abdomen is soft, nontender, nondistended, no abdominal masses GU: No CVA tenderness. On pelvic examination the patient's labia were tender and somewhat thickened likely from urinary dermatitis. The vaginal mucosa also was a bit tender and looked inflamed. She had a moderate grade 2 cystocele that would descend to the urethrovesical angle but not beyond. She had a grade 1 distal rectocele or small grade 2. She had a mild positive cough test but the bladder neck descended minimally. One could argue that she is at future risk of a hinge effect with another bladder suspension. Skin: No rashes, bruises or suspicious lesions. Lymph: No cervical or inguinal adenopathy. Neurologic: Grossly intact, no focal deficits, moving all 4 extremities. Psychiatric: Normal mood and affect.  Laboratory Data:  Lab Results  Component Value Date   WBC 7.4 11/14/2016   HGB 14.9 11/14/2016   HCT 44.2 11/14/2016   MCV 89.2 11/14/2016   PLT 196 11/14/2016    Lab Results  Component Value Date   CREATININE 0.69 11/14/2016    No results found for: PSA  No results found for: TESTOSTERONE  No results found for: HGBA1C  Urinalysis    Component Value Date/Time   COLORURINE YELLOW (A) 11/14/2016 1428   APPEARANCEUR HAZY (A) 11/14/2016 1428   APPEARANCEUR Clear 03/11/2015 2310   LABSPEC 1.035 (H) 11/14/2016 1428   LABSPEC 1.028 03/11/2015 2310   PHURINE 6.0 11/14/2016 1428   GLUCOSEU >500 (A) 11/14/2016 1428   GLUCOSEU >=500 03/11/2015 2310   HGBUR 1+ (A) 11/14/2016 1428   BILIRUBINUR NEGATIVE 11/14/2016 1428   BILIRUBINUR Negative 03/11/2015 2310   KETONESUR TRACE (A) 11/14/2016 1428   PROTEINUR NEGATIVE 11/14/2016 1428    UROBILINOGEN 1.0 09/19/2012 1920   NITRITE NEGATIVE 11/14/2016 1428   LEUKOCYTESUR 2+ (A) 11/14/2016 1428   LEUKOCYTESUR Negative 03/11/2015 2310    Pertinent Imaging: none  Assessment & Plan:  The patient has mixed incontinence and her overactive bladder component is significantly better now on the beta 3 agonists at 25 mg. She does have mild frequency and significant nighttime frequency.  The patient currently is wearing 3 pads per day moderately wet. I thought it was very reasonable to try the medication at a higher dosage. I think she should continue with the estrogen cream. Eventually she may need urodynamics if she wants to pursue her high treatment goals. I urethral injectable may be a better option for her.  The patient cannot feel or prolapse. Though she has high treatment goals I believe she was motivated not to have surgery. We talked about the role of urodynamics that may be needed in the future.  I will reassess her in 6 weeks on the higher dose each beta 3 agonists.  The role of an barrier cream on the labia was also discussed- steroid yeast medication  1. Urge incontinence 2. Stress incontinence 3. Cystocele  - Bladder Scan (Post Void Residual) in office   No Follow-up on file.  Reece Packer, MD  [facility] [address] [place], [place] [address] [phone]

## 2017-02-06 ENCOUNTER — Ambulatory Visit (INDEPENDENT_AMBULATORY_CARE_PROVIDER_SITE_OTHER): Payer: Medicare Other | Admitting: Urology

## 2017-02-06 ENCOUNTER — Encounter: Payer: Self-pay | Admitting: Urology

## 2017-02-06 VITALS — BP 139/83 | HR 99 | Ht 64.0 in | Wt 188.5 lb

## 2017-02-06 DIAGNOSIS — N393 Stress incontinence (female) (male): Secondary | ICD-10-CM | POA: Diagnosis not present

## 2017-02-06 DIAGNOSIS — N3941 Urge incontinence: Secondary | ICD-10-CM

## 2017-02-06 MED ORDER — MIRABEGRON ER 50 MG PO TB24: 50.0000 mg | ORAL_TABLET | Freq: Every day | ORAL | 3 refills | Status: DC

## 2017-02-06 NOTE — Progress Notes (Signed)
02/06/2017 8:43 AM   Crystal Foley [birth year] [id number]  Referring provider: [name], MD [facility] [place], [place] [address]  Chief Complaint  Patient presents with  . Follow-up    urge incontinence    HPI: Long note dictated last time The patient has mixed incontinence and her overactive bladder component is significantly better now on the beta 3 agonists at 25 mg. She does have mild frequency and significant nighttime frequency.  The patient currently is wearing 3 pads per day moderately wet. I thought it was very reasonable to try the medication at a higher dosage. I think she should continue with the estrogen cream. Eventually she may need urodynamics if she wants to pursue her high treatment goals. I urethral injectable may be a better option for her.  The patient cannot feel or prolapse. Though she has high treatment goals I believe she was motivated not to have surgery. We talked about the role of urodynamics that may be needed in the future.  I will reassess her in 6 weeks on the higher dose each beta 3 agonists.  The role of an barrier cream on the labia was also discussed- steroid yeast medication    Today The patient is dramatically better. She no longer gets up at night. She is completely dry. Clinically she is not infected and very pleased  PMH: Past Medical History:  Diagnosis Date  . Bilateral kidney stones   . COPD (chronic obstructive pulmonary disease) ([place])   . Diabetes mellitus   . Hematuria, gross   . Hydronephrosis   . Hypertension   . Seizures ([place])    last seizure 7-8 yrs ago  . Shortness of breath dyspnea    with exertion    Surgical History: Past Surgical History:  Procedure Laterality Date  . CYSTOSCOPY W/ URETERAL STENT PLACEMENT  03/11/15  . CYSTOSCOPY WITH RETROGRADE PYELOGRAM, URETEROSCOPY AND STENT PLACEMENT Left 03/17/2015   Procedure: CYSTOSCOPY, STENT REMOVAL, LEFT URETEROSCOPY   WITH STONE REMOVAL WITH BASKET;  Surgeon: Irine Seal, MD;  Location: [facility];  Service: Urology;  Laterality: Left;  . TUBAL LIGATION      Home Medications:  Allergies as of 02/06/2017      Reactions   Dilantin [phenytoin Sodium Extended] Swelling   Phenytoin Swelling      Medication List       Accurate as of 02/06/17  8:43 AM. Always use your most recent med list.          albuterol 108 (90 Base) MCG/ACT inhaler Commonly known as:  PROVENTIL HFA;VENTOLIN HFA Inhale 2 puffs into the lungs every 6 (six) hours as needed for wheezing or shortness of breath.   albuterol 108 (90 Base) MCG/ACT inhaler Commonly known as:  PROVENTIL HFA;VENTOLIN HFA Inhale 1 puff into the lungs every 6 (six) hours as needed for wheezing or shortness of breath.   atorvastatin 40 MG tablet Commonly known as:  LIPITOR Take 40 mg by mouth daily.   divalproex 250 MG 24 hr tablet Commonly known as:  DEPAKOTE ER Take 500-750 mg by mouth 2 (two) times daily. Take 500mg  in morning and take 750mg  at night   empagliflozin 25 MG Tabs tablet Commonly known as:  JARDIANCE Take by mouth.   fluticasone 50 MCG/ACT nasal spray Commonly known as:  FLONASE Place 2 sprays into both nostrils daily as needed for allergies or rhinitis.   glimepiride 2 MG tablet Commonly known as:  AMARYL  Take 2 mg by mouth daily with breakfast.   losartan 50 MG tablet Commonly known as:  COZAAR Take 50 mg by mouth every morning.   metFORMIN 500 MG 24 hr tablet Commonly known as:  GLUCOPHAGE-XR Take 2,000 mg by mouth daily with supper.   mirabegron ER 50 MG Tb24 tablet Commonly known as:  MYRBETRIQ Take 1 tablet (50 mg total) by mouth daily.       Allergies:  Allergies  Allergen Reactions  . Dilantin [Phenytoin Sodium Extended] Swelling  . Phenytoin Swelling    Family History: Family History  Problem Relation Age of Onset  . Coronary artery disease Sister   . Brain cancer Father   . Emphysema Mother      Social History:  reports that she has never smoked. She has never used smokeless tobacco. She reports that she does not drink alcohol or use drugs.  ROS: UROLOGY Frequent Urination?: No Hard to postpone urination?: No Burning/pain with urination?: No Get up at night to urinate?: No Leakage of urine?: No Urine stream starts and stops?: No Trouble starting stream?: No Do you have to strain to urinate?: No Blood in urine?: No Urinary tract infection?: No Sexually transmitted disease?: No Injury to kidneys or bladder?: No Painful intercourse?: No Weak stream?: No Currently pregnant?: No Vaginal bleeding?: No Last menstrual period?: n  Gastrointestinal Nausea?: No Vomiting?: No Indigestion/heartburn?: No Diarrhea?: No Constipation?: No  Constitutional Fever: No Night sweats?: No Weight loss?: No Fatigue?: No  Skin Skin rash/lesions?: No Itching?: No  Eyes Blurred vision?: No Double vision?: No  Ears/Nose/Throat Sore throat?: No Sinus problems?: No  Hematologic/Lymphatic Swollen glands?: No Easy bruising?: No  Cardiovascular Leg swelling?: No Chest pain?: No  Respiratory Cough?: No Shortness of breath?: No  Endocrine Excessive thirst?: No  Musculoskeletal Back pain?: No Joint pain?: No  Neurological Headaches?: No Dizziness?: No  Psychologic Depression?: No Anxiety?: No  Physical Exam: BP 139/83   Pulse 99   Ht 5\' 4"  (1.626 m)   Wt 85.5 kg (188 lb 8 oz)   BMI 32.36 kg/m     Laboratory Data: Lab Results  Component Value Date   WBC 7.4 11/14/2016   HGB 14.9 11/14/2016   HCT 44.2 11/14/2016   MCV 89.2 11/14/2016   PLT 196 11/14/2016    Lab Results  Component Value Date   CREATININE 0.69 11/14/2016    No results found for: PSA  No results found for: TESTOSTERONE  No results found for: HGBA1C  Urinalysis    Component Value Date/Time   COLORURINE YELLOW (A) 11/14/2016 1428   APPEARANCEUR HAZY (A) 11/14/2016 1428    APPEARANCEUR Clear 03/11/2015 2310   LABSPEC 1.035 (H) 11/14/2016 1428   LABSPEC 1.028 03/11/2015 2310   PHURINE 6.0 11/14/2016 1428   GLUCOSEU >500 (A) 11/14/2016 1428   GLUCOSEU >=500 03/11/2015 2310   HGBUR 1+ (A) 11/14/2016 1428   BILIRUBINUR NEGATIVE 11/14/2016 1428   BILIRUBINUR Negative 03/11/2015 2310   KETONESUR TRACE (A) 11/14/2016 1428   PROTEINUR NEGATIVE 11/14/2016 1428   UROBILINOGEN 1.0 09/19/2012 1920   NITRITE NEGATIVE 11/14/2016 1428   LEUKOCYTESUR 2+ (A) 11/14/2016 1428   LEUKOCYTESUR Negative 03/11/2015 2310    Pertinent Imaging: none  Assessment & Plan:  The patient is doing wonderfully on the beta 3 agonists. I gave her 3 months prescription with 3 refills and I will see her in one year. The labia have cleared as well  1. Urge incontinence of urine  2. Stress incontinence,  female    Return in about 1 year (around 02/06/2018).  Reece Packer, MD  [facility] [address], [place], [place] [address] [phone]

## 2017-02-06 NOTE — Addendum Note (Signed)
Addended by: Wilson Singer on: 02/06/2017 08:51 AM   Modules accepted: Orders

## 2017-03-31 ENCOUNTER — Ambulatory Visit (INDEPENDENT_AMBULATORY_CARE_PROVIDER_SITE_OTHER): Payer: Medicare Other | Admitting: Podiatry

## 2017-03-31 ENCOUNTER — Encounter: Payer: Self-pay | Admitting: Podiatry

## 2017-03-31 DIAGNOSIS — M79609 Pain in unspecified limb: Secondary | ICD-10-CM

## 2017-03-31 DIAGNOSIS — B351 Tinea unguium: Secondary | ICD-10-CM | POA: Diagnosis not present

## 2017-03-31 DIAGNOSIS — E119 Type 2 diabetes mellitus without complications: Secondary | ICD-10-CM

## 2017-03-31 NOTE — Progress Notes (Signed)
Complaint:  Visit Type: Patient returns to my office for continued preventative foot care services. Complaint: Patient states" my nails have grown long and thick and become painful to walk and wear shoes" Patient has been diagnosed with DM with no foot complications. The patient presents for preventative foot care services. No changes to ROS  Podiatric Exam: Vascular: dorsalis pedis and posterior tibial pulses are palpable bilateral. Capillary return is immediate. Temperature gradient is WNL. Skin turgor WNL  Sensorium: Normal Semmes Weinstein monofilament test. Normal tactile sensation bilaterally. Nail Exam: Pt has thick disfigured discolored nails with subungual debris noted bilateral entire nail hallux through fifth toenails Ulcer Exam: There is no evidence of ulcer or pre-ulcerative changes or infection. Orthopedic Exam: Muscle tone and strength are WNL. No limitations in general ROM. No crepitus or effusions noted. HAV  B/L. Skin: No Porokeratosis. No infection or ulcers  Diagnosis:  Onychomycosis, , Pain in right toe, pain in left toes  Treatment & Plan Procedures and Treatment: Consent by patient was obtained for treatment procedures. The patient understood the discussion of treatment and procedures well. All questions were answered thoroughly reviewed. Debridement of mycotic and hypertrophic toenails, 1 through 5 bilateral and clearing of subungual debris. No ulceration, no infection noted. Patient does not qualify fir diabetic shoes. Return Visit-Office Procedure: Patient instructed to return to the office for a follow up visit 3 months for continued evaluation and treatment.    Gardiner Barefoot DPM

## 2017-06-30 ENCOUNTER — Ambulatory Visit (INDEPENDENT_AMBULATORY_CARE_PROVIDER_SITE_OTHER): Payer: Medicare Other | Admitting: Podiatry

## 2017-06-30 ENCOUNTER — Encounter: Payer: Self-pay | Admitting: Podiatry

## 2017-06-30 DIAGNOSIS — B351 Tinea unguium: Secondary | ICD-10-CM

## 2017-06-30 DIAGNOSIS — M79609 Pain in unspecified limb: Secondary | ICD-10-CM | POA: Diagnosis not present

## 2017-06-30 DIAGNOSIS — E119 Type 2 diabetes mellitus without complications: Secondary | ICD-10-CM

## 2017-06-30 NOTE — Progress Notes (Signed)
Complaint:  Visit Type: Patient returns to my office for continued preventative foot care services. Complaint: Patient states" my nails have grown long and thick and become painful to walk and wear shoes" Patient has been diagnosed with DM with no foot complications. The patient presents for preventative foot care services. No changes to ROS  Podiatric Exam: Vascular: dorsalis pedis and posterior tibial pulses are palpable bilateral. Capillary return is immediate. Temperature gradient is WNL. Skin turgor WNL  Sensorium: Normal Semmes Weinstein monofilament test. Normal tactile sensation bilaterally. Nail Exam: Pt has thick disfigured discolored nails with subungual debris noted bilateral entire nail hallux through fifth toenails Ulcer Exam: There is no evidence of ulcer or pre-ulcerative changes or infection. Orthopedic Exam: Muscle tone and strength are WNL. No limitations in general ROM. No crepitus or effusions noted. HAV  B/L. Skin: No Porokeratosis. No infection or ulcers  Diagnosis:  Onychomycosis, , Pain in right toe, pain in left toes  Treatment & Plan Procedures and Treatment: Consent by patient was obtained for treatment procedures. The patient understood the discussion of treatment and procedures well. All questions were answered thoroughly reviewed. Debridement of mycotic and hypertrophic toenails, 1 through 5 bilateral and clearing of subungual debris. No ulceration, no infection noted.  Return Visit-Office Procedure: Patient instructed to return to the office for a follow up visit 3 months for continued evaluation and treatment.    Davy Faught DPM 

## 2017-09-29 ENCOUNTER — Ambulatory Visit (INDEPENDENT_AMBULATORY_CARE_PROVIDER_SITE_OTHER): Payer: Medicare Other | Admitting: Podiatry

## 2017-09-29 DIAGNOSIS — E119 Type 2 diabetes mellitus without complications: Secondary | ICD-10-CM

## 2017-09-29 DIAGNOSIS — B351 Tinea unguium: Secondary | ICD-10-CM | POA: Diagnosis not present

## 2017-09-29 DIAGNOSIS — M79609 Pain in unspecified limb: Secondary | ICD-10-CM | POA: Diagnosis not present

## 2017-09-29 NOTE — Progress Notes (Signed)
Complaint:  Visit Type: Patient returns to my office for continued preventative foot care services. Complaint: Patient states" my nails have grown long and thick and become painful to walk and wear shoes" Patient has been diagnosed with DM with no foot complications. The patient presents for preventative foot care services. No changes to ROS  Podiatric Exam: Vascular: dorsalis pedis and posterior tibial pulses are palpable bilateral. Capillary return is immediate. Temperature gradient is WNL. Skin turgor WNL  Sensorium: Normal Semmes Weinstein monofilament test. Normal tactile sensation bilaterally. Nail Exam: Pt has thick disfigured discolored nails with subungual debris noted bilateral entire nail hallux through fifth toenails Ulcer Exam: There is no evidence of ulcer or pre-ulcerative changes or infection. Orthopedic Exam: Muscle tone and strength are WNL. No limitations in general ROM. No crepitus or effusions noted. HAV  B/L. Skin: No Porokeratosis. No infection or ulcers  Diagnosis:  Onychomycosis, , Pain in right toe, pain in left toes  Treatment & Plan Procedures and Treatment: Consent by patient was obtained for treatment procedures. The patient understood the discussion of treatment and procedures well. All questions were answered thoroughly reviewed. Debridement of mycotic and hypertrophic toenails, 1 through 5 bilateral and clearing of subungual debris. No ulceration, no infection noted.  Return Visit-Office Procedure: Patient instructed to return to the office for a follow up visit 3 months for continued evaluation and treatment.    Jamaria Amborn DPM 

## 2017-11-03 ENCOUNTER — Other Ambulatory Visit: Payer: Self-pay | Admitting: Orthopedic Surgery

## 2017-11-03 DIAGNOSIS — M25551 Pain in right hip: Principal | ICD-10-CM

## 2017-11-03 DIAGNOSIS — G8929 Other chronic pain: Secondary | ICD-10-CM

## 2017-11-05 ENCOUNTER — Ambulatory Visit
Admission: RE | Admit: 2017-11-05 | Discharge: 2017-11-05 | Disposition: A | Discharge status: Home / Self Care | Payer: Medicare Other | Source: Ambulatory Visit | Attending: Orthopedic Surgery | Admitting: Orthopedic Surgery

## 2017-11-05 DIAGNOSIS — G8929 Other chronic pain: Secondary | ICD-10-CM | POA: Insufficient documentation

## 2017-11-05 DIAGNOSIS — M25551 Pain in right hip: Secondary | ICD-10-CM | POA: Diagnosis present

## 2017-11-05 DIAGNOSIS — M67853 Other specified disorders of tendon, right hip: Secondary | ICD-10-CM | POA: Diagnosis not present

## 2017-11-05 DIAGNOSIS — S76011A Strain of muscle, fascia and tendon of right hip, initial encounter: Secondary | ICD-10-CM | POA: Insufficient documentation

## 2017-11-07 ENCOUNTER — Ambulatory Visit: Payer: Medicare Other

## 2018-01-01 ENCOUNTER — Ambulatory Visit: Payer: Medicare Other | Admitting: Podiatry

## 2018-01-08 ENCOUNTER — Encounter: Payer: Self-pay | Admitting: Podiatry

## 2018-01-08 ENCOUNTER — Ambulatory Visit (INDEPENDENT_AMBULATORY_CARE_PROVIDER_SITE_OTHER): Payer: Medicare Other | Admitting: Podiatry

## 2018-01-08 DIAGNOSIS — B351 Tinea unguium: Secondary | ICD-10-CM

## 2018-01-08 DIAGNOSIS — M79609 Pain in unspecified limb: Secondary | ICD-10-CM

## 2018-01-08 DIAGNOSIS — E119 Type 2 diabetes mellitus without complications: Secondary | ICD-10-CM

## 2018-01-08 NOTE — Progress Notes (Signed)
Complaint:  Visit Type: Patient returns to my office for continued preventative foot care services. Complaint: Patient states" my nails have grown long and thick and become painful to walk and wear shoes" Patient has been diagnosed with DM with no foot complications. The patient presents for preventative foot care services. No changes to ROS  Podiatric Exam: Vascular: dorsalis pedis and posterior tibial pulses are palpable bilateral. Capillary return is immediate. Temperature gradient is WNL. Skin turgor WNL  Sensorium: Normal Semmes Weinstein monofilament test. Normal tactile sensation bilaterally. Nail Exam: Pt has thick disfigured discolored nails with subungual debris noted bilateral entire nail hallux through fifth toenails Ulcer Exam: There is no evidence of ulcer or pre-ulcerative changes or infection. Orthopedic Exam: Muscle tone and strength are WNL. No limitations in general ROM. No crepitus or effusions noted. HAV  B/L. Skin: No Porokeratosis. No infection or ulcers  Diagnosis:  Onychomycosis, , Pain in right toe, pain in left toes  Treatment & Plan Procedures and Treatment: Consent by patient was obtained for treatment procedures. The patient understood the discussion of treatment and procedures well. All questions were answered thoroughly reviewed. Debridement of mycotic and hypertrophic toenails, 1 through 5 bilateral and clearing of subungual debris. No ulceration, no infection noted.  Return Visit-Office Procedure: Patient instructed to return to the office for a follow up visit 3 months for continued evaluation and treatment.    Olyver Hawes DPM 

## 2018-01-21 ENCOUNTER — Telehealth: Payer: Self-pay | Admitting: Urology

## 2018-01-21 NOTE — Telephone Encounter (Signed)
Mail order [facility] on office voice mail asking for a 90 day supply refill for 50 mg Mybetriq. Please call [phone] ref# [id number] or fax or escribe to [phone]. Please advise. Thanks.

## 2018-01-22 NOTE — Telephone Encounter (Signed)
Spoke with [facility] and refills given.

## 2018-02-05 ENCOUNTER — Ambulatory Visit: Payer: Medicare Other

## 2018-02-16 ENCOUNTER — Ambulatory Visit (INDEPENDENT_AMBULATORY_CARE_PROVIDER_SITE_OTHER): Payer: Medicare Other | Admitting: Urology

## 2018-02-16 ENCOUNTER — Encounter: Payer: Self-pay | Admitting: Urology

## 2018-02-16 DIAGNOSIS — N3941 Urge incontinence: Secondary | ICD-10-CM | POA: Diagnosis not present

## 2018-02-16 DIAGNOSIS — N393 Stress incontinence (female) (male): Secondary | ICD-10-CM

## 2018-02-16 MED ORDER — CLOTRIMAZOLE-BETAMETHASONE 1-0.05 % EX CREA: 1.0000 "application " | TOPICAL_CREAM | Freq: Two times a day (BID) | CUTANEOUS | 3 refills | Status: DC

## 2018-02-16 MED ORDER — MIRABEGRON ER 50 MG PO TB24: 50.0000 mg | ORAL_TABLET | Freq: Every day | ORAL | 3 refills | Status: DC

## 2018-02-16 NOTE — Progress Notes (Signed)
02/16/2018 8:35 AM   Crystal Foley 01-24-1951 [id number]  Referring provider: [name], MD [facility] [place],  [address]  Chief Complaint  Patient presents with  . Urinary Incontinence    HPI: Long note dictated last time The patient has mixed incontinence and her overactive bladder component is significantly better now on the beta 3 agonists at 25 mg. She does have mild frequency and significant nighttime frequency.  The patient currently is wearing 3 pads per day moderately wet. I thought it was very reasonable to try the medication at a higher dosage. I think she should continue with the estrogen cream. Eventually she may need urodynamics if she wants to pursue her high treatment goals. I urethral injectable may be a better option for her.  The patient cannot feel or prolapse. Though she has high treatment goals I believe she was motivated not to have surgery. We talked about the role of urodynamics that may be needed in the future.  I will reassess her in 6 weeks on the higher dose each beta 3 agonists.  The patient is dramatically better. She no longer gets up at night. She is completely dry. Clinically she is not infected and very pleased    Today Nighttime frequency normal.  Excellent bladder control with no urge incontinence with some key in the door urgency.  Clinically not infected.  Still utilizes Myrbetriq and Lotrisone cream 15 g   PMH: Past Medical History:  Diagnosis Date  . Bilateral kidney stones   . COPD (chronic obstructive pulmonary disease) ([place])   . Diabetes mellitus   . Hematuria, gross   . Hydronephrosis   . Hypertension   . Seizures ([place])    last seizure 7-8 yrs ago  . Shortness of breath dyspnea    with exertion    Surgical History: Past Surgical History:  Procedure Laterality Date  . CYSTOSCOPY W/ URETERAL STENT PLACEMENT  03/11/15  . CYSTOSCOPY WITH RETROGRADE PYELOGRAM, URETEROSCOPY  AND STENT PLACEMENT Left 03/17/2015   Procedure: CYSTOSCOPY, STENT REMOVAL, LEFT URETEROSCOPY  WITH STONE REMOVAL WITH BASKET;  Surgeon: Irine Seal, MD;  Location: [facility];  Service: Urology;  Laterality: Left;  . TUBAL LIGATION      Home Medications:  Allergies as of 02/16/2018      Reactions   Dilantin [phenytoin Sodium Extended] Swelling   Phenytoin Swelling      Medication List        Accurate as of 02/16/18  8:35 AM. Always use your most recent med list.          albuterol 108 (90 Base) MCG/ACT inhaler Commonly known as:  PROVENTIL HFA;VENTOLIN HFA Inhale 1 puff into the lungs every 6 (six) hours as needed for wheezing or shortness of breath.   atorvastatin 40 MG tablet Commonly known as:  LIPITOR Take 40 mg by mouth daily.   clotrimazole-betamethasone cream Commonly known as:  LOTRISONE Apply 1 application topically 2 (two) times daily.   divalproex 250 MG 24 hr tablet Commonly known as:  DEPAKOTE ER Take 500-750 mg by mouth 2 (two) times daily. Take 500mg  in morning and take 750mg  at night   empagliflozin 25 MG Tabs tablet Commonly known as:  JARDIANCE Take by mouth.   fluticasone 50 MCG/ACT nasal spray Commonly known as:  FLONASE Place 2 sprays into both nostrils daily as needed for allergies or rhinitis.   glimepiride 2 MG tablet Commonly known as:  AMARYL Take 2  mg by mouth daily with breakfast.   losartan 50 MG tablet Commonly known as:  COZAAR Take 50 mg by mouth every morning.   metFORMIN 500 MG 24 hr tablet Commonly known as:  GLUCOPHAGE-XR Take 2,000 mg by mouth daily with supper.   mirabegron ER 50 MG Tb24 tablet Commonly known as:  MYRBETRIQ Take 1 tablet (50 mg total) by mouth daily.       Allergies:  Allergies  Allergen Reactions  . Dilantin [Phenytoin Sodium Extended] Swelling  . Phenytoin Swelling    Family History: Family History  Problem Relation Age of Onset  . Coronary artery disease Sister   . Brain cancer Father   .  Emphysema Mother     Social History:  reports that  has never smoked. she has never used smokeless tobacco. She reports that she does not drink alcohol or use drugs.  ROS: UROLOGY Frequent Urination?: No Hard to postpone urination?: No Burning/pain with urination?: No Get up at night to urinate?: No Leakage of urine?: No Urine stream starts and stops?: No Trouble starting stream?: No Do you have to strain to urinate?: No Blood in urine?: No Urinary tract infection?: No Sexually transmitted disease?: No Injury to kidneys or bladder?: No Painful intercourse?: No Weak stream?: No Currently pregnant?: No Vaginal bleeding?: No Last menstrual period?: n  Gastrointestinal Nausea?: No Vomiting?: No Indigestion/heartburn?: No Diarrhea?: No Constipation?: No  Constitutional Fever: No Night sweats?: No Weight loss?: No Fatigue?: No  Skin Skin rash/lesions?: No Itching?: No  Eyes Blurred vision?: No Double vision?: No  Ears/Nose/Throat Sore throat?: No Sinus problems?: No  Hematologic/Lymphatic Swollen glands?: No Easy bruising?: No  Cardiovascular Leg swelling?: No Chest pain?: No  Respiratory Cough?: No Shortness of breath?: No  Endocrine Excessive thirst?: No  Musculoskeletal Back pain?: No Joint pain?: No  Neurological Headaches?: No Dizziness?: No  Psychologic Depression?: No Anxiety?: No  Physical Exam: BP (!) 172/87 (BP Location: Right Arm, Patient Position: Sitting, Cuff Size: Normal)   Pulse 89   Ht 5\' 4"  (1.626 m)   Wt 82.8 kg (182 lb 9.6 oz)   BMI 31.34 kg/m   Constitutional:  Alert and oriented, No acute distress.   Laboratory Data: Lab Results  Component Value Date   WBC 7.4 11/14/2016   HGB 14.9 11/14/2016   HCT 44.2 11/14/2016   MCV 89.2 11/14/2016   PLT 196 11/14/2016    Lab Results  Component Value Date   CREATININE 0.69 11/14/2016    No results found for: PSA  No results found for: TESTOSTERONE  No results  found for: HGBA1C  Urinalysis    Component Value Date/Time   COLORURINE YELLOW (A) 11/14/2016 1428   APPEARANCEUR HAZY (A) 11/14/2016 1428   APPEARANCEUR Clear 03/11/2015 2310   LABSPEC 1.035 (H) 11/14/2016 1428   LABSPEC 1.028 03/11/2015 2310   PHURINE 6.0 11/14/2016 1428   GLUCOSEU >500 (A) 11/14/2016 1428   GLUCOSEU >=500 03/11/2015 2310   HGBUR 1+ (A) 11/14/2016 1428   BILIRUBINUR NEGATIVE 11/14/2016 1428   BILIRUBINUR Negative 03/11/2015 2310   KETONESUR TRACE (A) 11/14/2016 1428   PROTEINUR NEGATIVE 11/14/2016 1428   UROBILINOGEN 1.0 09/19/2012 1920   NITRITE NEGATIVE 11/14/2016 1428   LEUKOCYTESUR 2+ (A) 11/14/2016 1428   LEUKOCYTESUR Negative 03/11/2015 2310    Pertinent Imaging: None  Assessment & Plan: Both prescriptions renewed with 90 tablets and 3 refills and I will see her in 1 year  1. Urge incontinence of urine  - mirabegron ER (  MYRBETRIQ) 50 MG TB24 tablet; Take 1 tablet (50 mg total) by mouth daily.  Dispense: 90 tablet; Refill: 3  2. Stress incontinence, female  - mirabegron ER (MYRBETRIQ) 50 MG TB24 tablet; Take 1 tablet (50 mg total) by mouth daily.  Dispense: 90 tablet; Refill: 3   No Follow-up on file.  Reece Packer, MD  [facility] [address] [place], [place] [address] [phone]

## 2018-03-24 ENCOUNTER — Encounter: Payer: Self-pay | Admitting: *Deleted

## 2018-03-25 ENCOUNTER — Ambulatory Visit: Payer: Medicare Other | Admitting: Anesthesiology

## 2018-03-25 ENCOUNTER — Encounter: Payer: Self-pay | Admitting: *Deleted

## 2018-03-25 ENCOUNTER — Encounter
Admission: RE | Disposition: A | Discharge status: Home / Self Care | Payer: Self-pay | Source: Ambulatory Visit | Attending: Internal Medicine

## 2018-03-25 ENCOUNTER — Ambulatory Visit
Admission: RE | Admit: 2018-03-25 | Discharge: 2018-03-25 | Disposition: A | Discharge status: Home / Self Care | Payer: Medicare Other | Source: Ambulatory Visit | Attending: Internal Medicine | Admitting: Internal Medicine

## 2018-03-25 DIAGNOSIS — Z79899 Other long term (current) drug therapy: Secondary | ICD-10-CM | POA: Insufficient documentation

## 2018-03-25 DIAGNOSIS — E039 Hypothyroidism, unspecified: Secondary | ICD-10-CM | POA: Insufficient documentation

## 2018-03-25 DIAGNOSIS — Z6830 Body mass index (BMI) 30.0-30.9, adult: Secondary | ICD-10-CM | POA: Diagnosis not present

## 2018-03-25 DIAGNOSIS — Z791 Long term (current) use of non-steroidal anti-inflammatories (NSAID): Secondary | ICD-10-CM | POA: Diagnosis not present

## 2018-03-25 DIAGNOSIS — Z7951 Long term (current) use of inhaled steroids: Secondary | ICD-10-CM | POA: Diagnosis not present

## 2018-03-25 DIAGNOSIS — E1122 Type 2 diabetes mellitus with diabetic chronic kidney disease: Secondary | ICD-10-CM | POA: Insufficient documentation

## 2018-03-25 DIAGNOSIS — G40409 Other generalized epilepsy and epileptic syndromes, not intractable, without status epilepticus: Secondary | ICD-10-CM | POA: Insufficient documentation

## 2018-03-25 DIAGNOSIS — Z7984 Long term (current) use of oral hypoglycemic drugs: Secondary | ICD-10-CM | POA: Diagnosis not present

## 2018-03-25 DIAGNOSIS — Z1211 Encounter for screening for malignant neoplasm of colon: Secondary | ICD-10-CM | POA: Diagnosis not present

## 2018-03-25 DIAGNOSIS — E7800 Pure hypercholesterolemia, unspecified: Secondary | ICD-10-CM | POA: Insufficient documentation

## 2018-03-25 DIAGNOSIS — I129 Hypertensive chronic kidney disease with stage 1 through stage 4 chronic kidney disease, or unspecified chronic kidney disease: Secondary | ICD-10-CM | POA: Insufficient documentation

## 2018-03-25 DIAGNOSIS — Z86010 Personal history of colonic polyps: Secondary | ICD-10-CM | POA: Diagnosis present

## 2018-03-25 DIAGNOSIS — K640 First degree hemorrhoids: Secondary | ICD-10-CM | POA: Diagnosis not present

## 2018-03-25 DIAGNOSIS — Z538 Procedure and treatment not carried out for other reasons: Secondary | ICD-10-CM | POA: Insufficient documentation

## 2018-03-25 DIAGNOSIS — N182 Chronic kidney disease, stage 2 (mild): Secondary | ICD-10-CM | POA: Diagnosis not present

## 2018-03-25 DIAGNOSIS — J449 Chronic obstructive pulmonary disease, unspecified: Secondary | ICD-10-CM | POA: Insufficient documentation

## 2018-03-25 DIAGNOSIS — Z888 Allergy status to other drugs, medicaments and biological substances status: Secondary | ICD-10-CM | POA: Insufficient documentation

## 2018-03-25 HISTORY — DX: Calculus of kidney: N20.0

## 2018-03-25 HISTORY — PX: COLONOSCOPY WITH PROPOFOL: SHX5780

## 2018-03-25 HISTORY — DX: Hypothyroidism, unspecified: E03.9

## 2018-03-25 HISTORY — DX: Generalized idiopathic epilepsy and epileptic syndromes, not intractable, without status epilepticus: G40.309

## 2018-03-25 HISTORY — DX: Pure hypercholesterolemia, unspecified: E78.00

## 2018-03-25 HISTORY — DX: Unspecified asthma, uncomplicated: J45.909

## 2018-03-25 SURGERY — COLONOSCOPY WITH PROPOFOL
Anesthesia: General

## 2018-03-25 MED ORDER — LIDOCAINE HCL (CARDIAC) 20 MG/ML IV SOLN
INTRAVENOUS | Status: DC | PRN
  Administered 2018-03-25: 60 mg via INTRAVENOUS

## 2018-03-25 MED ORDER — PROPOFOL 500 MG/50ML IV EMUL
INTRAVENOUS | Status: AC
  Filled 2018-03-25: qty 50

## 2018-03-25 MED ORDER — MIDAZOLAM HCL 2 MG/2ML IJ SOLN
INTRAMUSCULAR | Status: AC
  Filled 2018-03-25: qty 2

## 2018-03-25 MED ORDER — MIDAZOLAM HCL 2 MG/2ML IJ SOLN
INTRAMUSCULAR | Status: DC | PRN
  Administered 2018-03-25: 2 mg via INTRAVENOUS

## 2018-03-25 MED ORDER — PROPOFOL 10 MG/ML IV BOLUS
INTRAVENOUS | Status: DC | PRN
  Administered 2018-03-25: 50 mg via INTRAVENOUS

## 2018-03-25 MED ORDER — SODIUM CHLORIDE 0.9 % IV SOLN
INTRAVENOUS | Status: DC
  Administered 2018-03-25: 1000 mL via INTRAVENOUS

## 2018-03-25 MED ORDER — LIDOCAINE HCL (PF) 2 % IJ SOLN
INTRAMUSCULAR | Status: AC
  Filled 2018-03-25: qty 10

## 2018-03-25 MED ORDER — PROPOFOL 500 MG/50ML IV EMUL
INTRAVENOUS | Status: DC | PRN
  Administered 2018-03-25: 100 ug/kg/min via INTRAVENOUS

## 2018-03-25 NOTE — Interval H&P Note (Signed)
History and Physical Interval Note:  03/25/2018 9:44 AM  Crystal Foley  has presented today for surgery, with the diagnosis of PRS HX COLON POLYPS  The various methods of treatment have been discussed with the patient and family. After consideration of risks, benefits and other options for treatment, the patient has consented to  Procedure(s): COLONOSCOPY WITH PROPOFOL (N/A) as a surgical intervention .  The patient's history has been reviewed, patient examined, no change in status, stable for surgery.  I have reviewed the patient's chart and labs.  Questions were answered to the patient's satisfaction.     [place], [place]

## 2018-03-25 NOTE — Anesthesia Postprocedure Evaluation (Signed)
Anesthesia Post Note  Patient: Crystal Foley  Procedure(s) Performed: COLONOSCOPY WITH PROPOFOL (N/A )  Patient location during evaluation: Endoscopy Anesthesia Type: General Level of consciousness: awake and alert Pain management: pain level controlled Vital Signs Assessment: post-procedure vital signs reviewed and stable Respiratory status: spontaneous breathing, nonlabored ventilation and respiratory function stable Cardiovascular status: blood pressure returned to baseline and stable Postop Assessment: no apparent nausea or vomiting Anesthetic complications: no     Last Vitals:  Vitals:   03/25/18 1020 03/25/18 1030  BP: (!) 143/77 (!) 145/76  Pulse: 79 82  Resp: 16 16  Temp:    SpO2: 100% 95%    Last Pain:  Vitals:   03/25/18 1010  TempSrc: Tympanic  PainSc:                  Alphonsus Sias

## 2018-03-25 NOTE — H&P (Signed)
Outpatient short stay form Pre-procedure 03/25/2018 9:43 AM Crystal Foley, M.D.  Primary Physician: [name], M.D.  Reason for visit:  Personal hx of colon polyps.  History of present illness: Patient presents for colonoscopy for polyp surveillance. The patient denies complaints of abdominal pain, significant change in bowel habits, or rectal bleeding.     Current Facility-Administered Medications:  .  0.9 %  sodium chloride infusion, , Intravenous, Continuous, [place], Benay Pike, MD, Last Rate: 20 mL/hr at 03/25/18 0919, 1,000 mL at 03/25/18 0919  Medications Prior to Admission  Medication Sig Dispense Refill Last Dose  . diclofenac sodium (VOLTAREN) 1 % GEL Apply 4 g topically 4 (four) times daily.     . divalproex (DEPAKOTE ER) 250 MG 24 hr tablet Take 500-750 mg by mouth 2 (two) times daily. Take 500mg  in morning and take 750mg  at night   03/25/2018 at Unknown time  . albuterol (PROVENTIL HFA;VENTOLIN HFA) 108 (90 BASE) MCG/ACT inhaler Inhale 1 puff into the lungs every 6 (six) hours as needed for wheezing or shortness of breath.   Taking  . atorvastatin (LIPITOR) 40 MG tablet Take 40 mg by mouth daily.   Taking  . clotrimazole-betamethasone (LOTRISONE) cream Apply 1 application topically 2 (two) times daily. 30 g 3   . empagliflozin (JARDIANCE) 25 MG TABS tablet Take by mouth.   Taking  . fluticasone (FLONASE) 50 MCG/ACT nasal spray Place 2 sprays into both nostrils daily as needed for allergies or rhinitis.   Taking  . glimepiride (AMARYL) 2 MG tablet Take 2 mg by mouth daily with breakfast.   Taking  . losartan (COZAAR) 50 MG tablet Take 50 mg by mouth every morning.   Taking  . metFORMIN (GLUCOPHAGE-XR) 500 MG 24 hr tablet Take 2,000 mg by mouth daily with supper.    Taking  . mirabegron ER (MYRBETRIQ) 50 MG TB24 tablet Take 1 tablet (50 mg total) by mouth daily. 90 tablet 3      Allergies  Allergen Reactions  . Dilantin [Phenytoin Sodium Extended] Swelling  .  Phenytoin Swelling     Past Medical History:  Diagnosis Date  . Asthma   . Bilateral kidney stones   . COPD (chronic obstructive pulmonary disease) ([place])   . Diabetes mellitus   . 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 Hematuria, gross   . Hydronephrosis   . Hypercholesterolemia   . Hypertension   . Hypothyroidism   . Nephrolithiasis   . Seizures ([place])    last seizure 7-8 yrs ago  . Shortness of breath dyspnea    with exertion    Review of systems:   Otherwise negative.   Physical Exam  Gen: Alert, oriented. Appears stated age.  HEENT: [place]/AT. PERRLA. Lungs: CTA, no wheezes. CV: RR nl S1, S2. Abd: soft, benign, no masses. BS+ Ext: No edema. Pulses 2+    Planned procedures: Colonoscopy. The patient understands the nature of the planned procedure, indications, risks, alternatives and potential complications including but not limited to bleeding, infection, perforation, damage to internal organs and possible oversedation/side effects from anesthesia. The patient agrees and gives consent to proceed.  Please refer to procedure notes for findings, recommendations and patient disposition/instructions.    Crystal Foley, M.D. Gastroenterology 03/25/2018  9:43 AM

## 2018-03-25 NOTE — Transfer of Care (Signed)
Immediate Anesthesia Transfer of Care Note  Patient: Crystal Foley  Procedure(s) Performed: COLONOSCOPY WITH PROPOFOL (N/A )  Patient Location: Endoscopy Unit  Anesthesia Type:General  Level of Consciousness: drowsy and patient cooperative  Airway & Oxygen Therapy: Patient Spontanous Breathing and Patient connected to nasal cannula oxygen  Post-op Assessment: Report given to RN, Post -op Vital signs reviewed and stable and Patient moving all extremities X 4  Post vital signs: Reviewed and stable  Last Vitals:  Vitals Value Taken Time  BP 121/60 03/25/2018 10:12 AM  Temp    Pulse 86 03/25/2018 10:12 AM  Resp 13 03/25/2018 10:12 AM  SpO2 98 % 03/25/2018 10:12 AM  Vitals shown include unvalidated device data.  Last Pain:  Vitals:   03/25/18 1010  TempSrc: (P) Tympanic  PainSc:          Complications: No apparent anesthesia complications

## 2018-03-25 NOTE — Op Note (Signed)
[facility] Gastroenterology Patient Name: Crystal Foley Procedure Date: 03/25/2018 9:56 AM MRN: [id number] Account #: [id number] Date of Birth: 08-17-1951 Admit Type: Outpatient Age: 67 Room: [facility] ENDO ROOM 4 Gender: Female Note Status: Finalized Procedure:            Colonoscopy Indications:          High risk colon cancer surveillance: Personal history                        of colonic polyps Providers:            Benay Pike. Alice Reichert MD, MD Referring MD:         Ocie Cornfield. Ouida Sills MD, MD (Referring MD) Medicines:            Propofol per Anesthesia Complications:        No immediate complications. Procedure:            Pre-Anesthesia Assessment:                       - The risks and benefits of the procedure and the                        sedation options and risks were discussed with the                        patient. All questions were answered and informed                        consent was obtained.                       - Patient identification and proposed procedure were                        verified prior to the procedure by the nurse. The                        procedure was verified in the procedure room.                       After obtaining informed consent, the colonoscope was                        passed under direct vision. Throughout the procedure,                        the patient's blood pressure, pulse, and oxygen                        saturations were monitored continuously. The                        Colonoscope was introduced through the anus with the                        intention of advancing to the cecum. The scope was                        advanced to the sigmoid colon before the procedure was  aborted. Medications were given. The colonoscopy was                        aborted due to poor bowel prep with stool present. Findings:      The perianal and digital rectal examinations were normal. Pertinent   negatives include normal sphincter tone and no palpable rectal lesions.      Non-bleeding internal hemorrhoids were found during retroflexion. The       hemorrhoids were Grade I (internal hemorrhoids that do not prolapse).      Extensive amounts of solid stool was found in the recto-sigmoid colon,       precluding visualization. Scope was withdrawn and procedure was       terminated at this point. Impression:           - The procedure was aborted due to poor bowel prep with                        stool present.                       - Non-bleeding internal hemorrhoids.                       - Stool in the recto-sigmoid colon.                       - No specimens collected. Recommendation:       - Patient has a contact number available for                        emergencies. The signs and symptoms of potential                        delayed complications were discussed with the patient.                        Return to normal activities tomorrow. Written discharge                        instructions were provided to the patient.                       - Resume previous diet.                       - Continue present medications.                       - Repeat colonoscopy in 1 month for surveillance.                       - Will require 2 day clear liquids with Trilyte prep,                        split dose schedule.                       - The findings and recommendations were discussed with                        the patient and their spouse. Procedure Code(s):    ---  Professional ---                       K0938, 53, Colorectal cancer screening; colonoscopy on                        individual at high risk Diagnosis Code(s):    --- Professional ---                       K64.0, First degree hemorrhoids                       Z53.8, Procedure and treatment not carried out for                        other reasons                       Z86.010, Personal history of colonic polyps CPT  copyright 2017 American Medical Association. All rights reserved. The codes documented in this report are preliminary and upon coder review may  be revised to meet current compliance requirements. Efrain Sella MD, MD 03/25/2018 10:11:45 AM This report has been signed electronically. Number of Addenda: 0 Note Initiated On: 03/25/2018 9:56 AM Total Procedure Duration: 0 hours 1 minute 43 seconds       [facility]

## 2018-03-25 NOTE — Anesthesia Preprocedure Evaluation (Signed)
Anesthesia Evaluation  Patient identified by MRN, date of birth, ID band Patient awake    Reviewed: Allergy & Precautions, H&P , NPO status , reviewed documented beta blocker date and time   Airway Mallampati: III  TM Distance: >3 FB Neck ROM: limited    Dental  (+) Caps   Pulmonary shortness of breath, asthma , COPD,    Pulmonary exam normal        Cardiovascular hypertension, Normal cardiovascular exam     Neuro/Psych Seizures -, Well Controlled,     GI/Hepatic   Endo/Other  diabetesHypothyroidism   Renal/GU Renal disease     Musculoskeletal   Abdominal   Peds  Hematology   Anesthesia Other Findings  Nephrolithiasis 06/01/2015 Pure hypercholesterolemia 09/27/2014 Last Assessment & Plan:   Diet for healthy cholesterol is being attempted and no clear myalgia's or other side effects are noted.   Severe obesity (BMI 35.0-35.9 with comorbidity) 09/25/2014 Last Assessment & Plan:   The patient states they are working on activity levels and healthy diet.   Asthma, unspecified 05/15/2014 Last Assessment & Plan:   The patient's breathing has been doing well and no recent flairs are noted.   Seizure 05/15/2014 Last Assessment & Plan:   No recent seizures are noted  Type 2 diabetes mellitus with stage 2 chronic kidney disease 05/15/2014 Last Assessment & Plan:   Diabetic diet is followed and no excessive highs or lows are noted. Nausea and itching are not symptomatic and nsaids are being avoided.   HTN (hypertension), benign 05/15/2014 Last Assessment & Plan:   Taking medications without noted side effects or dizziness.    Reproductive/Obstetrics                             Anesthesia Physical Anesthesia Plan  ASA: III  Anesthesia Plan: General   Post-op Pain Management:    Induction:   PONV Risk Score and Plan: 3 and Propofol infusion  Airway Management Planned:    Additional Equipment:   Intra-op Plan:   Post-operative Plan:   Informed Consent: I have reviewed the patients History and Physical, chart, labs and discussed the procedure including the risks, benefits and alternatives for the proposed anesthesia with the patient or authorized representative who has indicated his/her understanding and acceptance.   Dental Advisory Given  Plan Discussed with: CRNA  Anesthesia Plan Comments:         Anesthesia Quick Evaluation

## 2018-03-25 NOTE — Anesthesia Post-op Follow-up Note (Signed)
Anesthesia QCDR form completed.        

## 2018-03-26 ENCOUNTER — Encounter: Payer: Self-pay | Admitting: Internal Medicine

## 2018-03-26 NOTE — Progress Notes (Signed)
Pt reports she had a delayed reaction to the bowel prep and had to wear a depends yesterday due to multiple stools and urgency.

## 2018-04-09 ENCOUNTER — Ambulatory Visit (INDEPENDENT_AMBULATORY_CARE_PROVIDER_SITE_OTHER): Payer: Medicare Other | Admitting: Podiatry

## 2018-04-09 ENCOUNTER — Encounter: Payer: Self-pay | Admitting: Podiatry

## 2018-04-09 DIAGNOSIS — E119 Type 2 diabetes mellitus without complications: Secondary | ICD-10-CM | POA: Diagnosis not present

## 2018-04-09 DIAGNOSIS — M79609 Pain in unspecified limb: Secondary | ICD-10-CM | POA: Diagnosis not present

## 2018-04-09 DIAGNOSIS — B351 Tinea unguium: Secondary | ICD-10-CM | POA: Diagnosis not present

## 2018-04-09 NOTE — Progress Notes (Signed)
Complaint:  Visit Type: Patient returns to my office for continued preventative foot care services. Complaint: Patient states" my nails have grown long and thick and become painful to walk and wear shoes" Patient has been diagnosed with DM with no foot complications. The patient presents for preventative foot care services. No changes to ROS  Podiatric Exam: Vascular: dorsalis pedis and posterior tibial pulses are palpable bilateral. Capillary return is immediate. Temperature gradient is WNL. Skin turgor WNL  Sensorium: Normal Semmes Weinstein monofilament test. Normal tactile sensation bilaterally. Nail Exam: Pt has thick disfigured discolored nails with subungual debris noted bilateral entire nail hallux through fifth toenails Ulcer Exam: There is no evidence of ulcer or pre-ulcerative changes or infection. Orthopedic Exam: Muscle tone and strength are WNL. No limitations in general ROM. No crepitus or effusions noted. HAV  B/L. Skin: No Porokeratosis. No infection or ulcers  Diagnosis:  Onychomycosis, , Pain in right toe, pain in left toes  Treatment & Plan Procedures and Treatment: Consent by patient was obtained for treatment procedures. The patient understood the discussion of treatment and procedures well. All questions were answered thoroughly reviewed. Debridement of mycotic and hypertrophic toenails, 1 through 5 bilateral and clearing of subungual debris. No ulceration, no infection noted.  Return Visit-Office Procedure: Patient instructed to return to the office for a follow up visit 3 months for continued evaluation and treatment.    Najeh Credit DPM 

## 2018-05-12 ENCOUNTER — Encounter: Payer: Self-pay | Admitting: *Deleted

## 2018-05-13 ENCOUNTER — Encounter: Payer: Self-pay | Admitting: *Deleted

## 2018-05-13 ENCOUNTER — Ambulatory Visit: Payer: Medicare Other | Admitting: Certified Registered"

## 2018-05-13 ENCOUNTER — Encounter
Admission: RE | Disposition: A | Discharge status: Home / Self Care | Payer: Self-pay | Source: Ambulatory Visit | Attending: Internal Medicine

## 2018-05-13 ENCOUNTER — Ambulatory Visit
Admission: RE | Admit: 2018-05-13 | Discharge: 2018-05-13 | Disposition: A | Discharge status: Home / Self Care | Payer: Medicare Other | Source: Ambulatory Visit | Attending: Internal Medicine | Admitting: Internal Medicine

## 2018-05-13 DIAGNOSIS — D122 Benign neoplasm of ascending colon: Secondary | ICD-10-CM | POA: Insufficient documentation

## 2018-05-13 DIAGNOSIS — I10 Essential (primary) hypertension: Secondary | ICD-10-CM | POA: Diagnosis not present

## 2018-05-13 DIAGNOSIS — D123 Benign neoplasm of transverse colon: Secondary | ICD-10-CM | POA: Diagnosis not present

## 2018-05-13 DIAGNOSIS — E039 Hypothyroidism, unspecified: Secondary | ICD-10-CM | POA: Insufficient documentation

## 2018-05-13 DIAGNOSIS — G40409 Other generalized epilepsy and epileptic syndromes, not intractable, without status epilepticus: Secondary | ICD-10-CM | POA: Diagnosis not present

## 2018-05-13 DIAGNOSIS — Z86010 Personal history of colonic polyps: Secondary | ICD-10-CM | POA: Diagnosis not present

## 2018-05-13 DIAGNOSIS — Z1211 Encounter for screening for malignant neoplasm of colon: Secondary | ICD-10-CM | POA: Diagnosis present

## 2018-05-13 DIAGNOSIS — Z79899 Other long term (current) drug therapy: Secondary | ICD-10-CM | POA: Insufficient documentation

## 2018-05-13 DIAGNOSIS — J449 Chronic obstructive pulmonary disease, unspecified: Secondary | ICD-10-CM | POA: Insufficient documentation

## 2018-05-13 DIAGNOSIS — Z7984 Long term (current) use of oral hypoglycemic drugs: Secondary | ICD-10-CM | POA: Diagnosis not present

## 2018-05-13 DIAGNOSIS — E7800 Pure hypercholesterolemia, unspecified: Secondary | ICD-10-CM | POA: Diagnosis not present

## 2018-05-13 DIAGNOSIS — D125 Benign neoplasm of sigmoid colon: Secondary | ICD-10-CM | POA: Insufficient documentation

## 2018-05-13 DIAGNOSIS — K5730 Diverticulosis of large intestine without perforation or abscess without bleeding: Secondary | ICD-10-CM | POA: Diagnosis not present

## 2018-05-13 DIAGNOSIS — E119 Type 2 diabetes mellitus without complications: Secondary | ICD-10-CM | POA: Insufficient documentation

## 2018-05-13 HISTORY — PX: COLONOSCOPY WITH PROPOFOL: SHX5780

## 2018-05-13 HISTORY — DX: Generalized idiopathic epilepsy and epileptic syndromes, not intractable, without status epilepticus: G40.309

## 2018-05-13 LAB — GLUCOSE, CAPILLARY: GLUCOSE-CAPILLARY: 160 mg/dL — AB (ref 65–99)

## 2018-05-13 SURGERY — COLONOSCOPY WITH PROPOFOL
Anesthesia: General

## 2018-05-13 MED ORDER — PROPOFOL 10 MG/ML IV BOLUS
INTRAVENOUS | Status: DC | PRN
  Administered 2018-05-13: 100 mg via INTRAVENOUS

## 2018-05-13 MED ORDER — PROPOFOL 10 MG/ML IV BOLUS
INTRAVENOUS | Status: AC
  Filled 2018-05-13: qty 40

## 2018-05-13 MED ORDER — SODIUM CHLORIDE 0.9 % IV SOLN
INTRAVENOUS | Status: DC
  Administered 2018-05-13: 1000 mL via INTRAVENOUS

## 2018-05-13 MED ORDER — LIDOCAINE HCL (PF) 1 % IJ SOLN: 2.0000 mL | Freq: Once | INTRAMUSCULAR | Status: DC

## 2018-05-13 MED ORDER — LIDOCAINE HCL (CARDIAC) PF 100 MG/5ML IV SOSY
PREFILLED_SYRINGE | INTRAVENOUS | Status: DC | PRN
  Administered 2018-05-13: 80 mg via INTRAVENOUS

## 2018-05-13 MED ORDER — PROPOFOL 500 MG/50ML IV EMUL
INTRAVENOUS | Status: DC | PRN
  Administered 2018-05-13: 100 ug/kg/min via INTRAVENOUS

## 2018-05-13 NOTE — Transfer of Care (Signed)
Immediate Anesthesia Transfer of Care Note  Patient: Crystal Foley  Procedure(s) Performed: COLONOSCOPY WITH PROPOFOL (N/A )  Patient Location: PACU  Anesthesia Type:General  Level of Consciousness: awake, alert  and oriented  Airway & Oxygen Therapy: Patient Spontanous Breathing and Patient connected to nasal cannula oxygen  Post-op Assessment: Report given to RN and Post -op Vital signs reviewed and stable  Post vital signs: Reviewed and stable  Last Vitals:  Vitals Value Taken Time  BP    Temp    Pulse    Resp    SpO2      Last Pain:  Vitals:   05/13/18 0753  TempSrc: Tympanic         Complications: No apparent anesthesia complications

## 2018-05-13 NOTE — H&P (Signed)
Outpatient short stay form Pre-procedure 05/13/2018 8:18 AM Teodoro K. Alice Reichert, M.D.  Primary Physician: Frazier Richards, M.D.  Reason for visit:  Personal hx of colon polyps  History of present illness:  Patient with hx of constipation and recently suboptimal prep presents with more adequate prep this time, has a personal hx of colon polyps. Patient denies bleeding, abdominal pain, or involuntary weight loss.    Current Facility-Administered Medications:  .  0.9 %  sodium chloride infusion, , Intravenous, Continuous, [place], Benay Pike, MD, Last Rate: 20 mL/hr at 05/13/18 0808, 1,000 mL at 05/13/18 0808 .  lidocaine (PF) (XYLOCAINE) 1 % injection 2 mL, 2 mL, Intradermal, Once, [place], Benay Pike, MD  Medications Prior to Admission  Medication Sig Dispense Refill Last Dose  . albuterol (PROVENTIL HFA;VENTOLIN HFA) 108 (90 BASE) MCG/ACT inhaler Inhale 1 puff into the lungs every 6 (six) hours as needed for wheezing or shortness of breath.   Past Month at Unknown time  . atorvastatin (LIPITOR) 40 MG tablet Take 40 mg by mouth daily.   Taking  . clotrimazole-betamethasone (LOTRISONE) cream Apply 1 application topically 2 (two) times daily. 30 g 3   . diclofenac sodium (VOLTAREN) 1 % GEL Apply 4 g topically 4 (four) times daily.     . divalproex (DEPAKOTE ER) 250 MG 24 hr tablet Take 500-750 mg by mouth 2 (two) times daily. Take 500mg  in morning and take 750mg  at night   05/13/2018 at 0545  . empagliflozin (JARDIANCE) 25 MG TABS tablet Take by mouth.   Taking  . fluticasone (FLONASE) 50 MCG/ACT nasal spray Place 2 sprays into both nostrils daily as needed for allergies or rhinitis.   Taking  . Fluticasone-Salmeterol (ADVAIR) 250-50 MCG/DOSE AEPB Inhale 1 puff into the lungs 2 (two) times daily.   Not Taking at Unknown time  . glimepiride (AMARYL) 2 MG tablet Take 2 mg by mouth daily with breakfast.   Taking  . losartan (COZAAR) 100 MG tablet Take 50 mg by mouth every morning.    Taking  .  metFORMIN (GLUCOPHAGE-XR) 500 MG 24 hr tablet Take 2,000 mg by mouth daily with supper.    Taking  . mirabegron ER (MYRBETRIQ) 50 MG TB24 tablet Take 1 tablet (50 mg total) by mouth daily. 90 tablet 3      Allergies  Allergen Reactions  . Dilantin [Phenytoin Sodium Extended] Swelling  . Phenytoin Swelling     Past Medical History:  Diagnosis Date  . Asthma   . Bilateral kidney stones   . COPD (chronic obstructive pulmonary disease) ([place])   . Diabetes mellitus   . Generalized convulsive epilepsy ([place])   . 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 Hematuria, gross   . Hydronephrosis   . Hypercholesterolemia   . Hypertension   . Hypothyroidism   . Nephrolithiasis   . Nephrolithiasis   . Seizures ([place])    last seizure 7-8 yrs ago  . Shortness of breath dyspnea    with exertion    Review of systems:   Otherwise negative.    Physical Exam  Gen: Alert, oriented. Appears stated age.  HEENT: [place]/AT. PERRLA. Lungs: CTA, no wheezes. CV: RR nl S1, S2. Abd: soft, benign, no masses. BS+ Ext: No edema. Pulses 2+    Planned procedures: Proceed with colonoscopy. The patient understands the nature of the planned procedure, indications, risks, alternatives and potential complications including but not limited to bleeding, infection, perforation, damage to internal organs and possible oversedation/side effects  from anesthesia. The patient agrees and gives consent to proceed.  Please refer to procedure notes for findings, recommendations and patient disposition/instructions.    Teodoro K. Alice Reichert, M.D. Gastroenterology 05/13/2018  8:18 AM

## 2018-05-13 NOTE — Interval H&P Note (Signed)
History and Physical Interval Note:  05/13/2018 8:20 AM  Crystal Foley  has presented today for surgery, with the diagnosis of pers hx colon polyps  The various methods of treatment have been discussed with the patient and family. After consideration of risks, benefits and other options for treatment, the patient has consented to  Procedure(s): COLONOSCOPY WITH PROPOFOL (N/A) as a surgical intervention .  The patient's history has been reviewed, patient examined, no change in status, stable for surgery.  I have reviewed the patient's chart and labs.  Questions were answered to the patient's satisfaction.     [place], [place]

## 2018-05-13 NOTE — Anesthesia Postprocedure Evaluation (Signed)
Anesthesia Post Note  Patient: Crystal Foley  Procedure(s) Performed: COLONOSCOPY WITH PROPOFOL (N/A )  Patient location during evaluation: PACU Anesthesia Type: General Level of consciousness: awake and alert Pain management: pain level controlled Vital Signs Assessment: post-procedure vital signs reviewed and stable Respiratory status: spontaneous breathing, nonlabored ventilation, respiratory function stable and patient connected to nasal cannula oxygen Cardiovascular status: blood pressure returned to baseline and stable Postop Assessment: no apparent nausea or vomiting Anesthetic complications: no     Last Vitals:  Vitals:   05/13/18 0846 05/13/18 0916  BP: 113/67 130/60  Pulse: 93 95  Resp: 18   Temp: (!) 36.1 C   SpO2: 98% 97%    Last Pain:  Vitals:   05/13/18 0936  TempSrc:   PainSc: 0-No pain                 Molli Barrows

## 2018-05-13 NOTE — Anesthesia Post-op Follow-up Note (Signed)
Anesthesia QCDR form completed.        

## 2018-05-13 NOTE — Op Note (Signed)
[facility] Gastroenterology Patient Name: Crystal Foley Procedure Date: 05/13/2018 8:06 AM MRN: [id number] Account #: [id number] Date of Birth: [birth year] Admit Type: Outpatient Age: 67 Room: [facility] ENDO ROOM 2 Gender: Female Note Status: Finalized Procedure:            Colonoscopy Indications:          High risk colon cancer surveillance: Personal history                        of colonic polyps Providers:            Benay Pike. Nancye Grumbine MD, MD Medicines:            Propofol per Anesthesia Complications:        No immediate complications. Procedure:            Pre-Anesthesia Assessment:                       - The risks and benefits of the procedure and the                        sedation options and risks were discussed with the                        patient. All questions were answered and informed                        consent was obtained.                       - Patient identification and proposed procedure were                        verified prior to the procedure by the nurse. The                        procedure was verified in the procedure room.                       - ASA Grade Assessment: III - A patient with severe                        systemic disease.                       - After reviewing the risks and benefits, the patient                        was deemed in satisfactory condition to undergo the                        procedure.                       After obtaining informed consent, the colonoscope was                        passed under direct vision. Throughout the procedure,                        the patient's blood pressure, pulse, and oxygen  saturations were monitored continuously. The                        Colonoscope was introduced through the anus and                        advanced to the the cecum, identified by appendiceal                        orifice and ileocecal valve. The colonoscopy was             performed without difficulty. The patient tolerated the                        procedure well. The quality of the bowel preparation                        was good. The ileocecal valve, appendiceal orifice, and                        rectum were photographed. Findings:      The perianal and digital rectal examinations were normal. Pertinent       negatives include normal sphincter tone and no palpable rectal lesions.      A few small-mouthed diverticula were found in the sigmoid colon. There       was no evidence of diverticular bleeding.      A 3 mm polyp was found in the ascending colon. The polyp was sessile.       The polyp was removed with a jumbo cold forceps. Resection and retrieval       were complete.      Two semi-pedunculated polyps were found in the ascending colon. The       polyps were 5 to 8 mm in size. These polyps were removed with a hot       snare. Resection and retrieval were complete.      A 6 mm polyp was found in the hepatic flexure. The polyp was       semi-pedunculated. The polyp was removed with a hot snare. Resection and       retrieval were complete.      A 8 mm polyp was found in the sigmoid colon. The polyp was pedunculated.       The polyp was removed with a hot snare. Resection and retrieval were       complete.      The exam was otherwise without abnormality.      Non-bleeding internal hemorrhoids were found during retroflexion. The       hemorrhoids were Grade I (internal hemorrhoids that do not prolapse). Impression:           - Diverticulosis in the sigmoid colon. There was no                        evidence of diverticular bleeding.                       - One 3 mm polyp in the ascending colon, removed with a                        jumbo cold forceps. Resected and retrieved.                       -  Two 5 to 8 mm polyps in the ascending colon, removed                        with a hot snare. Resected and retrieved.                       -  One 6 mm polyp at the hepatic flexure, removed with a                        hot snare. Resected and retrieved.                       - One 8 mm polyp in the sigmoid colon, removed with a                        hot snare. Resected and retrieved.                       - The examination was otherwise normal. Recommendation:       - Patient has a contact number available for                        emergencies. The signs and symptoms of potential                        delayed complications were discussed with the patient.                        Return to normal activities tomorrow. Written discharge                        instructions were provided to the patient.                       - Resume previous diet.                       - Continue present medications.                       - Repeat colonoscopy is recommended for surveillance.                        The colonoscopy date will be determined after pathology                        results from today's exam become available for review.                       - Return to GI office PRN.                       - The findings and recommendations were discussed with                        the patient and their spouse. Procedure Code(s):    --- Professional ---                       [phone], Colonoscopy, flexible; with removal of tumor(s),  polyp(s), or other lesion(s) by snare technique                       [id number], 59, Colonoscopy, flexible; with biopsy, single                        or multiple Diagnosis Code(s):    --- Professional ---                       K57.30, Diverticulosis of large intestine without                        perforation or abscess without bleeding                       D12.2, Benign neoplasm of ascending colon                       D12.5, Benign neoplasm of sigmoid colon                       D12.3, Benign neoplasm of transverse colon (hepatic                        flexure or splenic flexure)                        Z86.010, Personal history of colonic polyps CPT copyright 2017 [employer]. All rights reserved. The codes documented in this report are preliminary and upon coder review may  be revised to meet current compliance requirements. Efrain Sella MD, MD 05/13/2018 8:49:45 AM This report has been signed electronically. Number of Addenda: 0 Note Initiated On: 05/13/2018 8:06 AM Scope Withdrawal Time: 0 hours 13 minutes 14 seconds  Total Procedure Duration: 0 hours 17 minutes 15 seconds       [facility]

## 2018-05-13 NOTE — Anesthesia Preprocedure Evaluation (Signed)
Anesthesia Evaluation  Patient identified by MRN, date of birth, ID band Patient awake    Reviewed: Allergy & Precautions, H&P , NPO status , Patient's Chart, lab work & pertinent test results, reviewed documented beta blocker date and time   Airway Mallampati: II   Neck ROM: full    Dental  (+) Poor Dentition   Pulmonary neg pulmonary ROS, shortness of breath and with exertion, asthma , COPD,    Pulmonary exam normal        Cardiovascular Exercise Tolerance: Poor hypertension, On Medications negative cardio ROS Normal cardiovascular exam Rhythm:regular Rate:Normal     Neuro/Psych Seizures -,  negative neurological ROS  negative psych ROS   GI/Hepatic negative GI ROS, Neg liver ROS,   Endo/Other  negative endocrine ROSdiabetesHypothyroidism   Renal/GU Renal diseasenegative Renal ROS  negative genitourinary   Musculoskeletal   Abdominal   Peds  Hematology negative hematology ROS (+)   Anesthesia Other Findings Past Medical History: No date: Asthma No date: Bilateral kidney stones No date: COPD (chronic obstructive pulmonary disease) (HCC) No date: Diabetes mellitus No date: 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[place]) No date: Hematuria, gross No date: Hydronephrosis No date: Hypercholesterolemia No date: Hypertension No date: Hypothyroidism No date: Nephrolithiasis No date: Nephrolithiasis No date: Seizures ([place])     Comment:  last seizure 7-8 yrs ago No date: Shortness of breath dyspnea     Comment:  with exertion Past Surgical History: No date: Bladder tack No date: COLONOSCOPY 03/25/2018: COLONOSCOPY WITH PROPOFOL; N/A     Comment:  Procedure: COLONOSCOPY WITH PROPOFOL;  Surgeon: [place],               Benay Pike, MD;  Location: [facility] ENDOSCOPY;  Service:               Gastroenterology;  Laterality: N/A; 03/11/15: CYSTOSCOPY W/  URETERAL STENT PLACEMENT 03/17/2015: CYSTOSCOPY WITH RETROGRADE PYELOGRAM, URETEROSCOPY AND  STENT PLACEMENT; Left     Comment:  Procedure: CYSTOSCOPY, STENT REMOVAL, LEFT URETEROSCOPY               WITH STONE REMOVAL WITH BASKET;  Surgeon: Irine Seal, MD;              Location: [facility] ORS;  Service: Urology;  Laterality: Left; No date: SHOULDER ARTHROSCOPY DISTAL CLAVICLE EXCISION AND OPEN  ROTATOR CUFF REPAIR No date: TUBAL LIGATION BMI    Body Mass Index:  30.73 kg/m     Reproductive/Obstetrics negative OB ROS                             Anesthesia Physical Anesthesia Plan  ASA: III  Anesthesia Plan: General   Post-op Pain Management:    Induction:   PONV Risk Score and Plan:   Airway Management Planned:   Additional Equipment:   Intra-op Plan:   Post-operative Plan:   Informed Consent: I have reviewed the patients History and Physical, chart, labs and discussed the procedure including the risks, benefits and alternatives for the proposed anesthesia with the patient or authorized representative who has indicated his/her understanding and acceptance.   Dental Advisory Given  Plan Discussed with: CRNA  Anesthesia Plan Comments:         Anesthesia Quick Evaluation

## 2018-05-14 ENCOUNTER — Encounter: Payer: Self-pay | Admitting: Internal Medicine

## 2018-05-14 LAB — SURGICAL PATHOLOGY

## 2018-07-09 ENCOUNTER — Ambulatory Visit: Payer: Medicare Other | Admitting: Podiatry

## 2018-07-16 ENCOUNTER — Ambulatory Visit (INDEPENDENT_AMBULATORY_CARE_PROVIDER_SITE_OTHER): Payer: Medicare Other | Admitting: Podiatry

## 2018-07-16 ENCOUNTER — Encounter: Payer: Self-pay | Admitting: Podiatry

## 2018-07-16 DIAGNOSIS — E119 Type 2 diabetes mellitus without complications: Secondary | ICD-10-CM | POA: Diagnosis not present

## 2018-07-16 DIAGNOSIS — B351 Tinea unguium: Secondary | ICD-10-CM

## 2018-07-16 DIAGNOSIS — M79609 Pain in unspecified limb: Secondary | ICD-10-CM

## 2018-07-16 NOTE — Progress Notes (Signed)
Complaint:  Visit Type: Patient returns to my office for continued preventative foot care services. Complaint: Patient states" my nails have grown long and thick and become painful to walk and wear shoes" Patient has been diagnosed with DM with no foot complications. The patient presents for preventative foot care services. No changes to ROS  Podiatric Exam: Vascular: dorsalis pedis and posterior tibial pulses are palpable bilateral. Capillary return is immediate. Temperature gradient is WNL. Skin turgor WNL  Sensorium: Normal Semmes Weinstein monofilament test. Normal tactile sensation bilaterally. Nail Exam: Pt has thick disfigured discolored nails with subungual debris noted bilateral entire nail hallux through fifth toenails Ulcer Exam: There is no evidence of ulcer or pre-ulcerative changes or infection. Orthopedic Exam: Muscle tone and strength are WNL. No limitations in general ROM. No crepitus or effusions noted. HAV  B/L. Skin: No Porokeratosis. No infection or ulcers  Diagnosis:  Onychomycosis, , Pain in right toe, pain in left toes  Treatment & Plan Procedures and Treatment: Consent by patient was obtained for treatment procedures. The patient understood the discussion of treatment and procedures well. All questions were answered thoroughly reviewed. Debridement of mycotic and hypertrophic toenails, 1 through 5 bilateral and clearing of subungual debris. No ulceration, no infection noted.  Return Visit-Office Procedure: Patient instructed to return to the office for a follow up visit 3 months for continued evaluation and treatment.    Flois Mctague DPM 

## 2018-10-12 ENCOUNTER — Ambulatory Visit (INDEPENDENT_AMBULATORY_CARE_PROVIDER_SITE_OTHER): Payer: Medicare Other | Admitting: Podiatry

## 2018-10-12 ENCOUNTER — Encounter: Payer: Self-pay | Admitting: Podiatry

## 2018-10-12 DIAGNOSIS — B351 Tinea unguium: Secondary | ICD-10-CM | POA: Diagnosis not present

## 2018-10-12 DIAGNOSIS — E119 Type 2 diabetes mellitus without complications: Secondary | ICD-10-CM

## 2018-10-12 DIAGNOSIS — M79676 Pain in unspecified toe(s): Secondary | ICD-10-CM

## 2018-10-12 DIAGNOSIS — M79609 Pain in unspecified limb: Principal | ICD-10-CM

## 2018-10-12 NOTE — Progress Notes (Signed)
Complaint:  Visit Type: Patient returns to my office for continued preventative foot care services. Complaint: Patient states" my nails have grown long and thick and become painful to walk and wear shoes" Patient has been diagnosed with DM with no foot complications. The patient presents for preventative foot care services. No changes to ROS  Podiatric Exam: Vascular: dorsalis pedis and posterior tibial pulses are palpable bilateral. Capillary return is immediate. Temperature gradient is WNL. Skin turgor WNL  Sensorium: Normal Semmes Weinstein monofilament test. Normal tactile sensation bilaterally. Nail Exam: Pt has thick disfigured discolored nails with subungual debris noted bilateral entire nail hallux through fifth toenails Ulcer Exam: There is no evidence of ulcer or pre-ulcerative changes or infection. Orthopedic Exam: Muscle tone and strength are WNL. No limitations in general ROM. No crepitus or effusions noted. HAV  B/L. Skin: No Porokeratosis. No infection or ulcers  Diagnosis:  Onychomycosis, , Pain in right toe, pain in left toes  Treatment & Plan Procedures and Treatment: Consent by patient was obtained for treatment procedures. The patient understood the discussion of treatment and procedures well. All questions were answered thoroughly reviewed. Debridement of mycotic and hypertrophic toenails, 1 through 5 bilateral and clearing of subungual debris. No ulceration, no infection noted.  Return Visit-Office Procedure: Patient instructed to return to the office for a follow up visit 3 months for continued evaluation and treatment.    Ardel Jagger DPM 

## 2018-12-20 DIAGNOSIS — R109 Unspecified abdominal pain: Secondary | ICD-10-CM | POA: Diagnosis present

## 2018-12-20 DIAGNOSIS — Z79899 Other long term (current) drug therapy: Secondary | ICD-10-CM | POA: Insufficient documentation

## 2018-12-20 DIAGNOSIS — N200 Calculus of kidney: Secondary | ICD-10-CM | POA: Diagnosis not present

## 2018-12-20 DIAGNOSIS — I10 Essential (primary) hypertension: Secondary | ICD-10-CM | POA: Diagnosis not present

## 2018-12-20 DIAGNOSIS — Z7984 Long term (current) use of oral hypoglycemic drugs: Secondary | ICD-10-CM | POA: Diagnosis not present

## 2018-12-20 DIAGNOSIS — E039 Hypothyroidism, unspecified: Secondary | ICD-10-CM | POA: Diagnosis not present

## 2018-12-20 DIAGNOSIS — J449 Chronic obstructive pulmonary disease, unspecified: Secondary | ICD-10-CM | POA: Insufficient documentation

## 2018-12-20 DIAGNOSIS — E119 Type 2 diabetes mellitus without complications: Secondary | ICD-10-CM | POA: Insufficient documentation

## 2018-12-20 NOTE — ED Triage Notes (Signed)
Patient to ED with complaint of left lower back pain and pressure in the bottom of her bladder. States she thinks she has a kidney stone. Has bright red blood in her urine, denies N/V.

## 2018-12-21 ENCOUNTER — Emergency Department
Admission: EM | Admit: 2018-12-21 | Discharge: 2018-12-21 | Disposition: A | Discharge status: Home / Self Care | Payer: Medicare Other | Attending: Emergency Medicine | Admitting: Emergency Medicine

## 2018-12-21 ENCOUNTER — Emergency Department: Payer: Medicare Other

## 2018-12-21 ENCOUNTER — Other Ambulatory Visit: Payer: Self-pay

## 2018-12-21 DIAGNOSIS — R109 Unspecified abdominal pain: Secondary | ICD-10-CM

## 2018-12-21 DIAGNOSIS — N200 Calculus of kidney: Secondary | ICD-10-CM

## 2018-12-21 LAB — BASIC METABOLIC PANEL
Anion gap: 10 (ref 5–15)
BUN: 15 mg/dL (ref 8–23)
CHLORIDE: 102 mmol/L (ref 98–111)
CO2: 25 mmol/L (ref 22–32)
Calcium: 9.1 mg/dL (ref 8.9–10.3)
Creatinine, Ser: 0.67 mg/dL (ref 0.44–1.00)
GFR calc Af Amer: >60 mL/min (ref >60)
GFR calc non Af Amer: >60 mL/min (ref >60)
Glucose, Bld: 215 mg/dL — ABNORMAL HIGH (ref 70–99)
POTASSIUM: 3.7 mmol/L (ref 3.5–5.1)
SODIUM: 137 mmol/L (ref 135–145)

## 2018-12-21 LAB — CBC WITH DIFFERENTIAL/PLATELET
ABS IMMATURE GRANULOCYTES: 0.03 10*3/uL (ref 0.00–0.07)
Basophils Absolute: 0.1 10*3/uL (ref 0.0–0.1)
Basophils Relative: 1 %
Eosinophils Absolute: 0.1 10*3/uL (ref 0.0–0.5)
Eosinophils Relative: 1 %
HCT: 46.1 % — ABNORMAL HIGH (ref 36.0–46.0)
HEMOGLOBIN: 15 g/dL (ref 12.0–15.0)
Immature Granulocytes: 0 %
LYMPHS ABS: 2.8 10*3/uL (ref 0.7–4.0)
LYMPHS PCT: 35 %
MCH: 29.6 pg (ref 26.0–34.0)
MCHC: 32.5 g/dL (ref 30.0–36.0)
MCV: 91.1 fL (ref 80.0–100.0)
MONO ABS: 0.8 10*3/uL (ref 0.1–1.0)
Monocytes Relative: 10 %
NEUTROS ABS: 4.4 10*3/uL (ref 1.7–7.7)
Neutrophils Relative %: 53 %
Platelets: 202 10*3/uL (ref 150–400)
RBC: 5.06 MIL/uL (ref 3.87–5.11)
RDW: 13.2 % (ref 11.5–15.5)
WBC: 8.1 10*3/uL (ref 4.0–10.5)
nRBC: 0 % (ref 0.0–0.2)

## 2018-12-21 LAB — URINALYSIS, COMPLETE (UACMP) WITH MICROSCOPIC
Bacteria, UA: NONE SEEN
Bilirubin Urine: NEGATIVE
KETONES UR: 5 mg/dL — AB
Leukocytes, UA: NEGATIVE
NITRITE: NEGATIVE
PROTEIN: NEGATIVE mg/dL
RBC / HPF: >50 RBC/hpf — ABNORMAL HIGH (ref 0–5)
Specific Gravity, Urine: 1.025 (ref 1.005–1.030)
pH: 6 (ref 5.0–8.0)

## 2018-12-21 MED ORDER — TAMSULOSIN HCL 0.4 MG PO CAPS: 0.4000 mg | ORAL_CAPSULE | Freq: Every day | ORAL | 0 refills | Status: DC

## 2018-12-21 MED ORDER — MORPHINE SULFATE (PF) 4 MG/ML IV SOLN
4.0000 mg | Freq: Once | INTRAVENOUS | Status: AC
  Administered 2018-12-21: 4 mg via INTRAVENOUS
  Filled 2018-12-21: qty 1

## 2018-12-21 MED ORDER — ONDANSETRON 4 MG PO TBDP: 4.0000 mg | ORAL_TABLET | Freq: Three times a day (TID) | ORAL | 0 refills | Status: DC | PRN

## 2018-12-21 MED ORDER — KETOROLAC TROMETHAMINE 30 MG/ML IJ SOLN
15.0000 mg | Freq: Once | INTRAMUSCULAR | Status: AC
  Administered 2018-12-21: 15 mg via INTRAVENOUS
  Filled 2018-12-21: qty 1

## 2018-12-21 MED ORDER — OXYCODONE-ACETAMINOPHEN 5-325 MG PO TABS: 1.0000 | ORAL_TABLET | ORAL | 0 refills | Status: DC | PRN

## 2018-12-21 MED ORDER — ONDANSETRON HCL 4 MG/2ML IJ SOLN
4.0000 mg | Freq: Once | INTRAMUSCULAR | Status: AC
  Administered 2018-12-21: 4 mg via INTRAVENOUS
  Filled 2018-12-21: qty 2

## 2018-12-21 MED ORDER — SODIUM CHLORIDE 0.9 % IV BOLUS
1000.0000 mL | Freq: Once | INTRAVENOUS | Status: AC
  Administered 2018-12-21: 1000 mL via INTRAVENOUS

## 2018-12-21 NOTE — Discharge Instructions (Addendum)
1.  Take ibuprofen for pain.  Take pain & nausea medicines as needed for more severe pain (Percocet/Zofran #15). Make sure to take a stool softener while taking narcotic pain medicines. 2. Take Flomax 0.4mg  daily x 14 days. 3. Drink plenty of bottled or filtered water daily. 4. Return to the ER for worsening symptoms, persistent vomiting, fever, difficulty breathing or other concerns.

## 2018-12-21 NOTE — ED Notes (Signed)
ED Provider at bedside. 

## 2018-12-21 NOTE — ED Provider Notes (Signed)
[facility] Emergency Department Provider Note   ____________________________________________   First MD Initiated Contact with Patient 12/21/18 0037     (approximate)  I have reviewed the triage vital signs and the nursing notes.   HISTORY  Chief Complaint Flank Pain and Hematuria    HPI Crystal Foley is a 68 y.o. female who presents to the ED from home with a chief complaint of left flank and suprapubic abdominal pain.  Also reports feeling like she was "peeing razor blades".  Onset of symptoms suddenly around 10 PM.  Denies associated fever, chills, chest pain, shortness of breath, nausea or vomiting.  History of large kidney stone 7 years ago which required stent.  Denies recent travel or trauma.   Past Medical History:  Diagnosis Date  . Asthma   . Bilateral kidney stones   . COPD (chronic obstructive pulmonary disease) ([place])   . Diabetes mellitus   . Generalized convulsive epilepsy ([place])   . 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 Hematuria, gross   . Hydronephrosis   . Hypercholesterolemia   . Hypertension   . Hypothyroidism   . Nephrolithiasis   . Nephrolithiasis   . Seizures ([place])    last seizure 7-8 yrs ago  . Shortness of breath dyspnea    with exertion    Patient Active Problem List   Diagnosis Date Noted  . Health care maintenance 09/19/2015  . Calculus of kidney 06/01/2015  . Hypercholesterolemia without hypertriglyceridemia 09/27/2014  . Pure hypercholesterolemia 09/27/2014  . Adult BMI 30+ 09/25/2014  . Morbid obesity ([place]) 09/25/2014  . Severe obesity (BMI 35.0-35.9 with comorbidity) ([place]) 09/25/2014  . Asthma 05/15/2014  . Benign hypertension 05/15/2014  . HLD (hyperlipidemia) 05/15/2014  . Seizure ([place]) 05/15/2014  . Diabetes mellitus, type 2 ([place]) 05/15/2014  . 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[place]) 05/15/2014    Past Surgical History:  Procedure  Laterality Date  . Bladder tack    . COLONOSCOPY    . COLONOSCOPY WITH PROPOFOL N/A 03/25/2018   Procedure: COLONOSCOPY WITH PROPOFOL;  Surgeon: Toledo, Benay Pike, MD;  Location: [facility] ENDOSCOPY;  Service: Gastroenterology;  Laterality: N/A;  . COLONOSCOPY WITH PROPOFOL N/A 05/13/2018   Procedure: COLONOSCOPY WITH PROPOFOL;  Surgeon: Toledo, Benay Pike, MD;  Location: [facility] ENDOSCOPY;  Service: Gastroenterology;  Laterality: N/A;  . CYSTOSCOPY W/ URETERAL STENT PLACEMENT  03/11/15  . CYSTOSCOPY WITH RETROGRADE PYELOGRAM, URETEROSCOPY AND STENT PLACEMENT Left 03/17/2015   Procedure: CYSTOSCOPY, STENT REMOVAL, LEFT URETEROSCOPY  WITH STONE REMOVAL WITH BASKET;  Surgeon: Irine Seal, MD;  Location: [facility] ORS;  Service: Urology;  Laterality: Left;  . SHOULDER ARTHROSCOPY DISTAL CLAVICLE EXCISION AND OPEN ROTATOR CUFF REPAIR    . TUBAL LIGATION      Prior to Admission medications   Medication Sig Start Date End Date Taking? Authorizing Provider  albuterol (PROVENTIL HFA;VENTOLIN HFA) 108 (90 BASE) MCG/ACT inhaler Inhale 1 puff into the lungs every 6 (six) hours as needed for wheezing or shortness of breath.    [provider]  atorvastatin (LIPITOR) 40 MG tablet Take 40 mg by mouth daily.    [provider]  clotrimazole-betamethasone (LOTRISONE) cream Apply 1 application topically 2 (two) times daily. 02/16/18   [name], MD  diclofenac sodium (VOLTAREN) 1 % GEL Apply 4 g topically 4 (four) times daily.    [provider]  divalproex (DEPAKOTE ER) 250 MG 24 hr tablet Take 500-750 mg by mouth  2 (two) times daily. Take 500mg  in morning and take 750mg  at night    [provider]  empagliflozin (JARDIANCE) 25 MG TABS tablet Take by mouth. 10/02/16   [provider]  fluticasone (FLONASE) 50 MCG/ACT nasal spray Place 2 sprays into both nostrils daily as needed for allergies or rhinitis.    [provider]  Fluticasone-Salmeterol (ADVAIR) 250-50 MCG/DOSE  AEPB Inhale 1 puff into the lungs 2 (two) times daily.    [provider]  glimepiride (AMARYL) 2 MG tablet Take 2 mg by mouth daily with breakfast.    [provider]  losartan (COZAAR) 100 MG tablet Take 50 mg by mouth every morning.     [provider]  metFORMIN (GLUCOPHAGE-XR) 500 MG 24 hr tablet Take 2,000 mg by mouth daily with supper.     [provider]  mirabegron ER (MYRBETRIQ) 50 MG TB24 tablet Take 1 tablet (50 mg total) by mouth daily. 02/16/18   [name], MD    Allergies Dilantin [phenytoin sodium extended] and Phenytoin  Family History  Problem Relation Age of Onset  . Coronary artery disease Sister   . Heart disease Sister   . Brain cancer Father   . Emphysema Mother     Social History Social History   Tobacco Use  . Smoking status: Never Smoker  . Smokeless tobacco: Never Used  Substance Use Topics  . Alcohol use: No  . Drug use: No    Review of Systems  Constitutional: No fever/chills Eyes: No visual changes. ENT: No sore throat. Cardiovascular: Denies chest pain. Respiratory: Denies shortness of breath. Gastrointestinal: Positive for left flank and lower  abdominal pain.  No nausea, no vomiting.  No diarrhea.  No constipation. Genitourinary: Positive for dysuria. Musculoskeletal: Negative for back pain. Skin: Negative for rash. Neurological: Negative for headaches, focal weakness or numbness.   ____________________________________________   PHYSICAL EXAM:  VITAL SIGNS: ED Triage Vitals  Enc Vitals Group     BP 12/20/18 2359 (!) 186/116     Pulse Rate 12/20/18 2359 (!) 105     Resp 12/20/18 2359 20     Temp 12/20/18 2359 98.9 F (37.2 C)     Temp Source 12/20/18 2359 Oral     SpO2 12/20/18 2359 100 %     Weight 12/21/18 0000 174 lb (78.9 kg)     Height 12/21/18 0000 5\' 6"  (1.676 m)     Head Circumference --      Peak Flow --      Pain Score 12/20/18 2359 6     Pain Loc --      Pain Edu? --        Excl. in [place]? --    Examined after administration of IV morphine: Constitutional: Alert and oriented. Well appearing and in no acute distress. Eyes: Conjunctivae are normal. PERRL. EOMI. Head: Atraumatic. Nose: No congestion/rhinnorhea. Mouth/Throat: Mucous membranes are moist.  Oropharynx non-erythematous. Neck: No stridor.   Cardiovascular: Normal rate, regular rhythm. Grossly normal heart sounds.  Good peripheral circulation. Respiratory: Normal respiratory effort.  No retractions. Lungs CTAB. Gastrointestinal: Soft and mildly tender to palpation suprapubic area without rebound or guarding. No distention. No abdominal bruits.  Mild left CVA tenderness. Musculoskeletal: No lower extremity tenderness nor edema.  No joint effusions. Neurologic:  Normal speech and language. No gross focal neurologic deficits are appreciated. No gait instability. Skin:  Skin is warm, dry and intact. No rash noted.  No vesicles. Psychiatric: Mood and affect are  normal. Speech and behavior are normal.  ____________________________________________   LABS (all labs ordered are listed, but only abnormal results are displayed)  Labs Reviewed  CBC WITH DIFFERENTIAL/PLATELET - Abnormal; Notable for the following components:      Result Value   HCT 46.1 (*)    All other components within normal limits  BASIC METABOLIC PANEL - Abnormal; Notable for the following components:   Glucose, Bld 215 (*)    All other components within normal limits  URINALYSIS, COMPLETE (UACMP) WITH MICROSCOPIC - Abnormal; Notable for the following components:   Color, Urine STRAW (*)    APPearance CLEAR (*)    Glucose, UA >=500 (*)    Hgb urine dipstick LARGE (*)    Ketones, ur 5 (*)    RBC / HPF >50 (*)    All other components within normal limits   ____________________________________________  EKG  None ____________________________________________  RADIOLOGY  ED MD interpretation: 4 mm passed stone with mild left  hydronephrosis; diverticulosis  Official radiology report(s): Ct Renal Stone Study  Result Date: 12/21/2018 CLINICAL DATA:  Left flank pain. Hematuria. EXAM: CT ABDOMEN AND PELVIS WITHOUT CONTRAST TECHNIQUE: Multidetector CT imaging of the abdomen and pelvis was performed following the standard protocol without IV contrast. COMPARISON:  CT 03/11/2015 FINDINGS: Lower chest: Mild elevation of left hemidiaphragm. No pleural fluid or consolidation. Hepatobiliary: No focal liver abnormality is seen. No gallstones, gallbladder wall thickening, or biliary dilatation. Pancreas: No ductal dilatation or inflammation. Spleen: Normal in size without focal abnormality. Adrenals/Urinary Tract: Normal adrenal glands. Left hydronephrosis and prominence of the ureter without ureteral calculi. There is a 4 mm stone in the dependent bladder likely recently passed stone. Distal cystic changes the left renal hilum are likely parapelvic cysts. There are clustered nonobstructing stones in the upper left kidney. Stable appearance of the right kidney with parapelvic cysts versus UPJ obstruction. Parapelvic cysts are favored. Punctate nonobstructing stones in the lower right kidney. The right ureter is decompressed. 4 mm stone in the dependent bladder, no bladder wall thickening. Stomach/Bowel: Stomach physiologically distended. Ingested material within the distal duodenum and proximal jejunum without obstruction or inflammation. More distal small bowel is decompressed. Moderate volume of stool throughout the colon, with areas of interspersed decompressed sigmoid colon. Significant distal colonic tortuosity. Minimal diverticulosis of the distal colon without diverticulitis. Ovoid fat density abutting the anti mesenteric border of the tortuous sigmoid colon may represent sequela of prior epiploic appendagitis. No acute inflammatory change. Normal appendix. Vascular/Lymphatic: Mild aorta bi-iliac atherosclerosis without aneurysm. No  adenopathy. Reproductive: Uterus and bilateral adnexa are unremarkable. Other: No free air, free fluid, or intra-abdominal fluid collection. Musculoskeletal: There are no acute or suspicious osseous abnormalities. IMPRESSION: 1. Mild left hydroureteronephrosis. There is a 4 mm stone in the dependent bladder, likely recently passed stone. No additional ureteral or obstructing calculi. 2. Nonobstructing stones in both kidneys. Parapelvic cysts in both kidneys. 3. Minimal distal colonic diverticulosis without diverticulitis. 4.  Aortic Atherosclerosis (ICD10-I70.0). Electronically Signed   By: Keith Rake M.D.   On: 12/21/2018 00:54    ____________________________________________   PROCEDURES  Procedure(s) performed: None  Procedures  Critical Care performed: No  ____________________________________________   INITIAL IMPRESSION / ASSESSMENT AND PLAN / ED COURSE  As part of my medical decision making, I reviewed the following data within the electronic MEDICAL RECORD NUMBER History obtained from family, Nursing notes reviewed and incorporated, Labs reviewed, Old chart reviewed, Radiograph reviewed and Notes from prior ED visits   69 year old female with  prior history of kidney stones who presents with left flank to suprapubic abdominal pain and dysuria. Differential diagnosis includes, but is not limited to, ovarian cyst, ovarian torsion, acute appendicitis, diverticulitis, urinary tract infection/pyelonephritis, endometriosis, bowel obstruction, colitis, renal colic, gastroenteritis, hernia, fibroids, etc.  Will obtain screening lab work, urinalysis, CT renal colic study.  Initiate IV fluid resuscitation, administer 4 mg IV morphine for pain, paired with 4 mg IV Zofran for nausea. Clinical Course as of Dec 21 126  Mon Dec 21, 2018  0049 Currently rates pain 1/10 after IV morphine.   [JS]  0127 Updated patient and spouse on all test results.  Will discharge home with prescription for  Flomax, Percocet and Zofran to use as needed.  Encouraged urological follow-up.  Strict return precautions given.  Both verbalize understanding and agree with plan of care.   [JS]    Clinical Course User Index [JS] [name], MD     ____________________________________________   FINAL CLINICAL IMPRESSION(S) / ED DIAGNOSES  Final diagnoses:  Kidney stone  Flank pain     ED Discharge Orders    None       Note:  This document was prepared using Dragon voice recognition software and may include unintentional dictation errors.    [name], MD 12/21/18 [phone]

## 2018-12-23 ENCOUNTER — Observation Stay
Admission: EM | Admit: 2018-12-23 | Discharge: 2018-12-24 | Disposition: A | Discharge status: Home / Self Care | Payer: Medicare Other | Attending: Internal Medicine | Admitting: Internal Medicine

## 2018-12-23 ENCOUNTER — Other Ambulatory Visit: Payer: Self-pay

## 2018-12-23 ENCOUNTER — Emergency Department: Payer: Medicare Other

## 2018-12-23 DIAGNOSIS — R531 Weakness: Secondary | ICD-10-CM | POA: Diagnosis present

## 2018-12-23 DIAGNOSIS — Z87442 Personal history of urinary calculi: Secondary | ICD-10-CM | POA: Insufficient documentation

## 2018-12-23 DIAGNOSIS — N1330 Unspecified hydronephrosis: Secondary | ICD-10-CM | POA: Diagnosis not present

## 2018-12-23 DIAGNOSIS — A419 Sepsis, unspecified organism: Secondary | ICD-10-CM | POA: Diagnosis present

## 2018-12-23 DIAGNOSIS — Z79891 Long term (current) use of opiate analgesic: Secondary | ICD-10-CM | POA: Diagnosis not present

## 2018-12-23 DIAGNOSIS — Z7984 Long term (current) use of oral hypoglycemic drugs: Secondary | ICD-10-CM | POA: Diagnosis not present

## 2018-12-23 DIAGNOSIS — G40909 Epilepsy, unspecified, not intractable, without status epilepticus: Secondary | ICD-10-CM | POA: Insufficient documentation

## 2018-12-23 DIAGNOSIS — R0602 Shortness of breath: Secondary | ICD-10-CM | POA: Diagnosis present

## 2018-12-23 DIAGNOSIS — I10 Essential (primary) hypertension: Secondary | ICD-10-CM | POA: Insufficient documentation

## 2018-12-23 DIAGNOSIS — Z79899 Other long term (current) drug therapy: Secondary | ICD-10-CM | POA: Insufficient documentation

## 2018-12-23 DIAGNOSIS — E119 Type 2 diabetes mellitus without complications: Secondary | ICD-10-CM | POA: Insufficient documentation

## 2018-12-23 DIAGNOSIS — J449 Chronic obstructive pulmonary disease, unspecified: Secondary | ICD-10-CM | POA: Insufficient documentation

## 2018-12-23 DIAGNOSIS — R05 Cough: Secondary | ICD-10-CM | POA: Diagnosis present

## 2018-12-23 DIAGNOSIS — J189 Pneumonia, unspecified organism: Secondary | ICD-10-CM | POA: Diagnosis not present

## 2018-12-23 LAB — CBC WITH DIFFERENTIAL/PLATELET
Abs Immature Granulocytes: 0.04 10*3/uL (ref 0.00–0.07)
BASOS PCT: 1 %
Basophils Absolute: 0 10*3/uL (ref 0.0–0.1)
Eosinophils Absolute: 0 10*3/uL (ref 0.0–0.5)
Eosinophils Relative: 0 %
HCT: 42.5 % (ref 36.0–46.0)
Hemoglobin: 13.7 g/dL (ref 12.0–15.0)
Immature Granulocytes: 1 %
Lymphocytes Relative: 25 %
Lymphs Abs: 1.4 10*3/uL (ref 0.7–4.0)
MCH: 30 pg (ref 26.0–34.0)
MCHC: 32.2 g/dL (ref 30.0–36.0)
MCV: 93.2 fL (ref 80.0–100.0)
Monocytes Absolute: 0.5 10*3/uL (ref 0.1–1.0)
Monocytes Relative: 9 %
Neutro Abs: 3.7 10*3/uL (ref 1.7–7.7)
Neutrophils Relative %: 64 %
PLATELETS: 177 10*3/uL (ref 150–400)
RBC: 4.56 MIL/uL (ref 3.87–5.11)
RDW: 12.9 % (ref 11.5–15.5)
WBC: 5.6 10*3/uL (ref 4.0–10.5)
nRBC: 0 % (ref 0.0–0.2)

## 2018-12-23 LAB — COMPREHENSIVE METABOLIC PANEL
ALT: 14 U/L (ref 0–44)
AST: 19 U/L (ref 15–41)
Albumin: 3.3 g/dL — ABNORMAL LOW (ref 3.5–5.0)
Alkaline Phosphatase: 58 U/L (ref 38–126)
Anion gap: 12 (ref 5–15)
BUN: 10 mg/dL (ref 8–23)
CO2: 21 mmol/L — ABNORMAL LOW (ref 22–32)
Calcium: 8.7 mg/dL — ABNORMAL LOW (ref 8.9–10.3)
Chloride: 104 mmol/L (ref 98–111)
Creatinine, Ser: 0.68 mg/dL (ref 0.44–1.00)
GFR calc Af Amer: >60 mL/min (ref >60)
GFR calc non Af Amer: >60 mL/min (ref >60)
GLUCOSE: 287 mg/dL — AB (ref 70–99)
POTASSIUM: 3.4 mmol/L — AB (ref 3.5–5.1)
Sodium: 137 mmol/L (ref 135–145)
Total Bilirubin: 0.6 mg/dL (ref 0.3–1.2)
Total Protein: 6.5 g/dL (ref 6.5–8.1)

## 2018-12-23 LAB — INFLUENZA PANEL BY PCR (TYPE A & B)
INFLAPCR: NEGATIVE
Influenza B By PCR: NEGATIVE

## 2018-12-23 LAB — URINALYSIS, COMPLETE (UACMP) WITH MICROSCOPIC
Bacteria, UA: NONE SEEN
Bilirubin Urine: NEGATIVE
Glucose, UA: >=500 mg/dL — AB
Hgb urine dipstick: NEGATIVE
Ketones, ur: 5 mg/dL — AB
LEUKOCYTES UA: NEGATIVE
Nitrite: NEGATIVE
PROTEIN: NEGATIVE mg/dL
Specific Gravity, Urine: 1.023 (ref 1.005–1.030)
pH: 6 (ref 5.0–8.0)

## 2018-12-23 LAB — CG4 I-STAT (LACTIC ACID)
Lactic Acid, Venous: 6.3 mmol/L (ref 0.5–1.9)
Lactic Acid, Venous: 1.66 mmol/L (ref 0.5–1.9)

## 2018-12-23 LAB — MRSA PCR SCREENING: MRSA by PCR: NEGATIVE

## 2018-12-23 LAB — TROPONIN I: Troponin I: <0.03 ng/mL (ref <0.03)

## 2018-12-23 LAB — LACTIC ACID, PLASMA
Lactic Acid, Venous: 2.5 mmol/L (ref 0.5–1.9)
Lactic Acid, Venous: 2.1 mmol/L (ref 0.5–1.9)

## 2018-12-23 LAB — GLUCOSE, CAPILLARY
Glucose-Capillary: 91 mg/dL (ref 70–99)
Glucose-Capillary: 156 mg/dL — ABNORMAL HIGH (ref 70–99)

## 2018-12-23 MED ORDER — ENOXAPARIN SODIUM 40 MG/0.4ML ~~LOC~~ SOLN
40.0000 mg | SUBCUTANEOUS | Status: DC
  Administered 2018-12-23: 40 mg via SUBCUTANEOUS
  Filled 2018-12-23: qty 0.4

## 2018-12-23 MED ORDER — SODIUM CHLORIDE 0.9 % IV BOLUS
1000.0000 mL | Freq: Once | INTRAVENOUS | Status: AC
  Administered 2018-12-23: 1000 mL via INTRAVENOUS

## 2018-12-23 MED ORDER — SODIUM CHLORIDE 0.9 % IV SOLN
2.0000 g | Freq: Once | INTRAVENOUS | Status: AC
  Administered 2018-12-23: 2 g via INTRAVENOUS
  Filled 2018-12-23: qty 2

## 2018-12-23 MED ORDER — ONDANSETRON HCL 4 MG PO TABS: 4.0000 mg | ORAL_TABLET | Freq: Four times a day (QID) | ORAL | Status: DC | PRN

## 2018-12-23 MED ORDER — METRONIDAZOLE IN NACL 5-0.79 MG/ML-% IV SOLN
500.0000 mg | Freq: Three times a day (TID) | INTRAVENOUS | Status: DC
  Administered 2018-12-23 – 2018-12-24 (×3): 500 mg via INTRAVENOUS
  Filled 2018-12-23 (×5): qty 100

## 2018-12-23 MED ORDER — ATORVASTATIN CALCIUM 20 MG PO TABS
40.0000 mg | ORAL_TABLET | Freq: Every day | ORAL | Status: DC
  Administered 2018-12-24: 40 mg via ORAL
  Filled 2018-12-23: qty 2

## 2018-12-23 MED ORDER — DIVALPROEX SODIUM ER 500 MG PO TB24
750.0000 mg | ORAL_TABLET | Freq: Every day | ORAL | Status: DC
  Administered 2018-12-23: 750 mg via ORAL
  Filled 2018-12-23 (×2): qty 1

## 2018-12-23 MED ORDER — SODIUM CHLORIDE 0.9 % IV SOLN
2.0000 g | Freq: Two times a day (BID) | INTRAVENOUS | Status: DC
  Administered 2018-12-23: 2 g via INTRAVENOUS
  Filled 2018-12-23 (×3): qty 2

## 2018-12-23 MED ORDER — POTASSIUM CHLORIDE CRYS ER 20 MEQ PO TBCR
20.0000 meq | EXTENDED_RELEASE_TABLET | Freq: Once | ORAL | Status: AC
  Administered 2018-12-23: 20 meq via ORAL
  Filled 2018-12-23: qty 1

## 2018-12-23 MED ORDER — GLIMEPIRIDE 2 MG PO TABS
2.0000 mg | ORAL_TABLET | Freq: Every day | ORAL | Status: DC
  Administered 2018-12-24: 08:00:00 2 mg via ORAL
  Filled 2018-12-23: qty 1

## 2018-12-23 MED ORDER — OXYCODONE-ACETAMINOPHEN 5-325 MG PO TABS: 1.0000 | ORAL_TABLET | ORAL | Status: DC | PRN

## 2018-12-23 MED ORDER — VANCOMYCIN HCL IN DEXTROSE 1-5 GM/200ML-% IV SOLN
1000.0000 mg | Freq: Two times a day (BID) | INTRAVENOUS | Status: DC
  Filled 2018-12-23: qty 200

## 2018-12-23 MED ORDER — IPRATROPIUM-ALBUTEROL 0.5-2.5 (3) MG/3ML IN SOLN: 3.0000 mL | Freq: Four times a day (QID) | RESPIRATORY_TRACT | Status: DC

## 2018-12-23 MED ORDER — VANCOMYCIN HCL IN DEXTROSE 1-5 GM/200ML-% IV SOLN: 1000.0000 mg | Freq: Once | INTRAVENOUS | Status: DC

## 2018-12-23 MED ORDER — SENNOSIDES-DOCUSATE SODIUM 8.6-50 MG PO TABS: 1.0000 | ORAL_TABLET | Freq: Every evening | ORAL | Status: DC | PRN

## 2018-12-23 MED ORDER — TAMSULOSIN HCL 0.4 MG PO CAPS
0.4000 mg | ORAL_CAPSULE | Freq: Every day | ORAL | Status: DC
  Filled 2018-12-23: qty 1

## 2018-12-23 MED ORDER — INSULIN ASPART 100 UNIT/ML ~~LOC~~ SOLN: 0.0000 [IU] | Freq: Every day | SUBCUTANEOUS | Status: DC

## 2018-12-23 MED ORDER — FLUTICASONE PROPIONATE 50 MCG/ACT NA SUSP
2.0000 | Freq: Every day | NASAL | Status: DC | PRN
  Filled 2018-12-23: qty 16

## 2018-12-23 MED ORDER — ONDANSETRON HCL 4 MG/2ML IJ SOLN: 4.0000 mg | Freq: Four times a day (QID) | INTRAMUSCULAR | Status: DC | PRN

## 2018-12-23 MED ORDER — INSULIN ASPART 100 UNIT/ML ~~LOC~~ SOLN: 0.0000 [IU] | Freq: Three times a day (TID) | SUBCUTANEOUS | Status: DC

## 2018-12-23 MED ORDER — DIVALPROEX SODIUM ER 250 MG PO TB24: 500.0000 mg | ORAL_TABLET | Freq: Two times a day (BID) | ORAL | Status: DC

## 2018-12-23 MED ORDER — VANCOMYCIN HCL IN DEXTROSE 1-5 GM/200ML-% IV SOLN
1000.0000 mg | Freq: Once | INTRAVENOUS | Status: AC
  Administered 2018-12-23: 1000 mg via INTRAVENOUS
  Filled 2018-12-23: qty 200

## 2018-12-23 MED ORDER — SODIUM CHLORIDE 0.9 % IV SOLN
INTRAVENOUS | Status: DC
  Administered 2018-12-23: 19:00:00 via INTRAVENOUS

## 2018-12-23 MED ORDER — LOSARTAN POTASSIUM 50 MG PO TABS
50.0000 mg | ORAL_TABLET | Freq: Every morning | ORAL | Status: DC
  Administered 2018-12-24: 50 mg via ORAL
  Filled 2018-12-23: qty 1

## 2018-12-23 MED ORDER — VANCOMYCIN HCL IN DEXTROSE 1-5 GM/200ML-% IV SOLN
1000.0000 mg | Freq: Once | INTRAVENOUS | Status: DC
  Administered 2018-12-23: 1000 mg via INTRAVENOUS
  Filled 2018-12-23: qty 200

## 2018-12-23 MED ORDER — ACETAMINOPHEN 325 MG PO TABS: 650.0000 mg | ORAL_TABLET | Freq: Four times a day (QID) | ORAL | Status: DC | PRN

## 2018-12-23 MED ORDER — MIRABEGRON ER 50 MG PO TB24
50.0000 mg | ORAL_TABLET | Freq: Every day | ORAL | Status: DC
  Administered 2018-12-24: 10:00:00 50 mg via ORAL
  Filled 2018-12-23: qty 1

## 2018-12-23 MED ORDER — IPRATROPIUM-ALBUTEROL 0.5-2.5 (3) MG/3ML IN SOLN
3.0000 mL | Freq: Four times a day (QID) | RESPIRATORY_TRACT | Status: DC
  Administered 2018-12-23 – 2018-12-24 (×4): 3 mL via RESPIRATORY_TRACT
  Filled 2018-12-23 (×4): qty 3

## 2018-12-23 MED ORDER — DIVALPROEX SODIUM ER 500 MG PO TB24
500.0000 mg | ORAL_TABLET | Freq: Every morning | ORAL | Status: DC
  Administered 2018-12-24: 10:00:00 500 mg via ORAL
  Filled 2018-12-23: qty 1

## 2018-12-23 MED ORDER — ACETAMINOPHEN 650 MG RE SUPP: 650.0000 mg | Freq: Four times a day (QID) | RECTAL | Status: DC | PRN

## 2018-12-23 NOTE — ED Notes (Signed)
Second attempt to call report as per nurse Tiffany the bed is room now but need to be cleaned by EVS. Nurse reports she put call in for EVS can not tell me how long it would be before patient can be transferred to assigned room.

## 2018-12-23 NOTE — ED Notes (Signed)
3rd attempt to call report as per secretary they are still waiting for EVS to come make the bed. Tiffany RN asked to come pick up patient once the bed is ready.

## 2018-12-23 NOTE — Progress Notes (Signed)
Advanced care plan.  Purpose of the Encounter: CODE STATUS  Parties in Attendance: Patient and family  Patient's Decision Capacity: Good  Subjective/Patient's story: Presented to the emergency room for generalized weakness, dizziness and cough   Objective/Medical story Patient has pneumonia, dehydration and sepsis Needs IV fluids and antibiotics   Goals of care determination:  Advance care directives goals of care and treatment plan discussed Patient wants everything done which includes CPR, intubation ventilator if the need arises   CODE STATUS: Full code   Time spent discussing advanced care planning: 16 minutes

## 2018-12-23 NOTE — ED Notes (Signed)
Charge nurse

## 2018-12-23 NOTE — Progress Notes (Signed)
Pharmacy Antibiotic Note  Crystal Foley is a 68 y.o. female admitted on 12/23/2018 with sepsis.  Pharmacy has been consulted for cefepime and vancomycin dosing.  Vd 56.8 Ke 0.075, t1/2 9.2   Plan: Pt received vancomycin 1000 mg x 1 in the ED. Will give another 1000 mg dose for a total of 2000 mg loading dose (25 mg/kg). Will start a maintenance dose of 1000 mg q12H. Used Scr 0.8 for calculations. Predicted AUC is 469. Goal AUC is 400-550. Plan to obtain levels at the 4th- 5th dose of vancomycin.   Will order cefepime 2 g q12H.   12/23/2018 :  MRSA PCR negative,  Will d/c vancomycin.   Height: 5\' 6"  (167.6 cm) IBW/kg (Calculated) : 59.3  Temp (24hrs), Avg:98 F (36.7 C), Min:97.8 F (36.6 C), Max:98.4 F (36.9 C)  Recent Labs  Lab 12/21/18 0014 12/23/18 1205 12/23/18 1207 12/23/18 1418 12/23/18 1728 12/23/18 2008  WBC 8.1 5.6  --   --   --   --   CREATININE 0.67 0.68  --   --   --   --   LATICACIDVEN  --   --  6.30* 1.66 2.5* 2.1*    Estimated Creatinine Clearance: 72.3 mL/min (by C-G formula based on SCr of 0.68 mg/dL).    Allergies  Allergen Reactions  . Dilantin [Phenytoin Sodium Extended] Swelling  . Phenytoin Swelling    Antimicrobials this admission: 1/8 cefepime >>  1/8 vancomycin >>   Dose adjustments this admission: none  Microbiology results: 1/8 BCx: pending 1/8 UCx: pending   Thank you for allowing pharmacy to be a part of this patient's care.  Orene Desanctis, PharmD, BCPS Clinical Pharmacist 12/23/2018 9:51 PM

## 2018-12-23 NOTE — ED Notes (Addendum)
1st attempt to call report receiving nurse Tiffany, states will call back when bed is in room.

## 2018-12-23 NOTE — Progress Notes (Signed)
Critical Lactic Acid of 2.5 called to Dr. Brett Albino

## 2018-12-23 NOTE — ED Notes (Signed)
Off unit to xray

## 2018-12-23 NOTE — ED Provider Notes (Signed)
[facility] Emergency Department Provider Note   ____________________________________________   I have reviewed the triage vital signs and the nursing notes.   HISTORY  Chief Complaint Weakness  History limited by: Not Limited   HPI Crystal Foley is a 68 y.o. female who presents to the emergency department today because of concerns for weakness and shortness of breath.  The patient states that she recently passed a kidney stone.  She was seen in the emergency department for this few days ago and states that she did see at past.  She states that since that time she has had gradually increasing weakness.  She stated today was very hard for her to get up.  She does have associated shortness of breath with this.  She denies any chest pain.  Denies any vomiting or diarrhea.  Denies any burning or bad odor to her urine.  She denies any fevers but states she feels cold.   Per medical record review patient has a history of recent ER visit for kidney stone.   Past Medical History:  Diagnosis Date  . Asthma   . Bilateral kidney stones   . COPD (chronic obstructive pulmonary disease) ([place])   . Diabetes mellitus   . Generalized convulsive epilepsy ([place])   . 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 Hematuria, gross   . Hydronephrosis   . Hypercholesterolemia   . Hypertension   . Hypothyroidism   . Nephrolithiasis   . Nephrolithiasis   . Seizures ([place])    last seizure 7-8 yrs ago  . Shortness of breath dyspnea    with exertion    Patient Active Problem List   Diagnosis Date Noted  . Health care maintenance 09/19/2015  . Calculus of kidney 06/01/2015  . Hypercholesterolemia without hypertriglyceridemia 09/27/2014  . Pure hypercholesterolemia 09/27/2014  . Adult BMI 30+ 09/25/2014  . Morbid obesity ([place]) 09/25/2014  . Severe obesity (BMI 35.0-35.9 with comorbidity) ([place]) 09/25/2014  . Asthma 05/15/2014  . Benign hypertension  05/15/2014  . HLD (hyperlipidemia) 05/15/2014  . Seizure ([place]) 05/15/2014  . Diabetes mellitus, type 2 ([place]) 05/15/2014  . 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 Bladder tack    . COLONOSCOPY    . COLONOSCOPY WITH PROPOFOL N/A 03/25/2018   Procedure: COLONOSCOPY WITH PROPOFOL;  Surgeon: Toledo, Benay Pike, MD;  Location: [facility] ENDOSCOPY;  Service: Gastroenterology;  Laterality: N/A;  . COLONOSCOPY WITH PROPOFOL N/A 05/13/2018   Procedure: COLONOSCOPY WITH PROPOFOL;  Surgeon: Toledo, Benay Pike, MD;  Location: [facility] ENDOSCOPY;  Service: Gastroenterology;  Laterality: N/A;  . CYSTOSCOPY W/ URETERAL STENT PLACEMENT  03/11/15  . CYSTOSCOPY WITH RETROGRADE PYELOGRAM, URETEROSCOPY AND STENT PLACEMENT Left 03/17/2015   Procedure: CYSTOSCOPY, STENT REMOVAL, LEFT URETEROSCOPY  WITH STONE REMOVAL WITH BASKET;  Surgeon: Irine Seal, MD;  Location: [facility];  Service: Urology;  Laterality: Left;  . SHOULDER ARTHROSCOPY DISTAL CLAVICLE EXCISION AND OPEN ROTATOR CUFF REPAIR    . TUBAL LIGATION      Prior to Admission medications   Medication Sig Start Date End Date Taking? Authorizing Provider  albuterol (PROVENTIL HFA;VENTOLIN HFA) 108 (90 BASE) MCG/ACT inhaler Inhale 1 puff into the lungs every 6 (six) hours as needed for wheezing or shortness of breath.    [provider]  atorvastatin (LIPITOR) 40 MG tablet Take 40 mg by mouth daily.    [provider]  clotrimazole-betamethasone (LOTRISONE) cream Apply 1 application topically 2 (two) times daily. 02/16/18   [name], MD  diclofenac sodium (VOLTAREN) 1 % GEL Apply 4 g topically 4 (four) times daily.    [provider]  divalproex (DEPAKOTE ER) 250 MG 24 hr tablet Take 500-750 mg by mouth 2 (two) times daily. Take 500mg  in morning and take 750mg  at night    [provider]  empagliflozin (JARDIANCE) 25 MG TABS tablet Take by  mouth. 10/02/16   [provider]  fluticasone (FLONASE) 50 MCG/ACT nasal spray Place 2 sprays into both nostrils daily as needed for allergies or rhinitis.    [provider]  Fluticasone-Salmeterol (ADVAIR) 250-50 MCG/DOSE AEPB Inhale 1 puff into the lungs 2 (two) times daily.    [provider]  glimepiride (AMARYL) 2 MG tablet Take 2 mg by mouth daily with breakfast.    [provider]  losartan (COZAAR) 100 MG tablet Take 50 mg by mouth every morning.     [provider]  metFORMIN (GLUCOPHAGE-XR) 500 MG 24 hr tablet Take 2,000 mg by mouth daily with supper.     [provider]  mirabegron ER (MYRBETRIQ) 50 MG TB24 tablet Take 1 tablet (50 mg total) by mouth daily. 02/16/18   MacDiarmid, [name], MD  ondansetron (ZOFRAN ODT) 4 MG disintegrating tablet Take 1 tablet (4 mg total) by mouth every 8 (eight) hours as needed for nausea or vomiting. 12/21/18   [name], MD  oxyCODONE-acetaminophen (PERCOCET/ROXICET) 5-325 MG tablet Take 1 tablet by mouth every 4 (four) hours as needed for severe pain. 12/21/18   [name], MD  tamsulosin (FLOMAX) 0.4 MG CAPS capsule Take 1 capsule (0.4 mg total) by mouth daily. 12/21/18   [name], MD    Allergies Dilantin [phenytoin sodium extended] and Phenytoin  Family History  Problem Relation Age of Onset  . Coronary artery disease Sister   . Heart disease Sister   . Brain cancer Father   . Emphysema Mother     Social History Social History   Tobacco Use  . Smoking status: Never Smoker  . Smokeless tobacco: Never Used  Substance Use Topics  . Alcohol use: No  . Drug use: No    Review of Systems Constitutional: Generalized weakness.  Eyes: No visual changes. ENT: No sore throat. Cardiovascular: Denies chest pain. Respiratory: Positive for shortness of breath. Gastrointestinal: Positive for lower abdominal pain.   Genitourinary: Negative for dysuria. Musculoskeletal: Negative for back  pain. Skin: Negative for rash. Neurological: Negative for headaches, focal weakness or numbness.  ____________________________________________   PHYSICAL EXAM:  VITAL SIGNS: ED Triage Vitals  Enc Vitals Group     BP 149/79     Pulse 94     Resp 16     Temp 97.8     Temp src      SpO2 94    Constitutional: Alert and oriented.  Eyes: Conjunctivae are normal.  ENT      Head: Normocephalic and atraumatic.      Nose: No congestion/rhinnorhea.      Mouth/Throat: Mucous membranes are moist.      Neck: No stridor. Hematological/Lymphatic/Immunilogical: No cervical lymphadenopathy. Cardiovascular: Normal rate, regular rhythm.  No murmurs, rubs, or gallops. Respiratory: Slightly increased work of breathing. Lung sounds clear.  Gastrointestinal: Soft and non tender. No rebound. No guarding.  Genitourinary: Deferred Musculoskeletal: Normal range of motion in all extremities. No lower extremity edema. Neurologic:  Normal speech and language.  No gross focal neurologic deficits are appreciated.  Skin:  Skin is warm, dry and intact. No rash noted. Psychiatric: Mood and affect are normal. Speech and behavior are normal. Patient exhibits appropriate insight and judgment.  ____________________________________________    LABS (pertinent positives/negatives)  Lactic 6.30 CBC wbc 5.6, hgb 13.7, plt 177 Trop <0.03 CMP na 137, k 3.4, glu 287  ____________________________________________   EKG  None  ____________________________________________    RADIOLOGY  CXR Hazy left lower lobe process. Pneumonia vs atelectasis.   ____________________________________________   PROCEDURES  Procedures  CRITICAL CARE Performed by: Nance Pear   Total critical care time: 30 minutes  Critical care time was exclusive of separately billable procedures and treating other patients.  Critical care was necessary to treat or prevent imminent or life-threatening  deterioration.  Critical care was time spent personally by me on the following activities: development of treatment plan with patient and/or surrogate as well as nursing, discussions with consultants, evaluation of patient's response to treatment, examination of patient, obtaining history from patient or surrogate, ordering and performing treatments and interventions, ordering and review of laboratory studies, ordering and review of radiographic studies, pulse oximetry and re-evaluation of patient's condition.  ____________________________________________   INITIAL IMPRESSION / ASSESSMENT AND PLAN / ED COURSE  Pertinent labs & imaging results that were available during my care of the patient were reviewed by me and considered in my medical decision making (see chart for details).   Patient presented to the er because of concern for shortness of breath and weakness. Differential would be broad including pna, influenza, anemia, electrolyte abnormality amongst other etiologies. Point of care lactic acid was significantly elevated. Given this finding concern for primary infection. CXR concerning for pneumonia which could explain the shortness of breath. Patient was started on broad antibiotics. Patient was given multiple fluid boluses. Discussed findings with patient. Discussed plan for admission with the patient.    ____________________________________________   FINAL CLINICAL IMPRESSION(S) / ED DIAGNOSES  Final diagnoses:  Pneumonia due to infectious organism, unspecified laterality, unspecified part of lung  Shortness of breath     Note: This dictation was prepared with Dragon dictation. Any transcriptional errors that result from this process are unintentional     Nance Pear, MD 12/23/18 1332

## 2018-12-23 NOTE — Progress Notes (Addendum)
Pharmacy Antibiotic Note  Crystal Foley is a 68 y.o. female admitted on 12/23/2018 with sepsis.  Pharmacy has been consulted for cefepime and vancomycin dosing.  Vd 56.8 Ke 0.075, t1/2 9.2   Plan: Pt received vancomycin 1000 mg x 1 in the ED. Will give another 1000 mg dose for a total of 2000 mg loading dose (25 mg/kg). Will start a maintenance dose of 1000 mg q12H. Used Scr 0.8 for calculations. Predicted AUC is 469. Goal AUC is 400-550. Plan to obtain levels at the 4th- 5th dose of vancomycin.   Will order cefepime 2 g q12H.      Temp (24hrs), Avg:97.8 F (36.6 C), Min:97.8 F (36.6 C), Max:97.8 F (36.6 C)  Recent Labs  Lab 12/21/18 0014 12/23/18 1205 12/23/18 1207  WBC 8.1 5.6  --   CREATININE 0.67 0.68  --   LATICACIDVEN  --   --  6.30*    Estimated Creatinine Clearance: 72.3 mL/min (by C-G formula based on SCr of 0.68 mg/dL).    Allergies  Allergen Reactions  . Dilantin [Phenytoin Sodium Extended] Swelling  . Phenytoin Swelling    Antimicrobials this admission: 1/8 cefepime >>  1/8 vancomycin >>   Dose adjustments this admission: none  Microbiology results: 1/8 BCx: pending 1/8 UCx: pending   Thank you for allowing pharmacy to be a part of this patient's care.  Oswald Hillock, PharmD, BCPS Clinical Pharmacist 12/23/2018 1:52 PM

## 2018-12-23 NOTE — H&P (Signed)
[facility] [place] NAME: Crystal Foley    MR#:  [id number]  DATE OF BIRTH:  05-25-1951  DATE OF ADMISSION:  12/23/2018  PRIMARY CARE PHYSICIAN: [name], MD   REQUESTING/REFERRING PHYSICIAN:   CHIEF COMPLAINT: Dizziness  HISTORY OF PRESENT ILLNESS: Crystal Foley  is a 68 y.o. female with a known history of COPD, diabetes mellitus type 2, epilepsy, nephrolithiasis, hydronephrosis, hypertension, hypothyroidism presented to the emergency room for weakness and dizziness.  Patient also has occasional cough.  She recently passed kidney stone and was worked up with CT kidney stone protocol 2 days ago.  He was evaluated in the emergency room lactic acid was 6 and chest x-ray revealed pneumonia.  No recent travel or sick contacts at home.  Flu test is negative.  Patient started on broad-spectrum antibiotics and hospitalist service was consulted.  PAST MEDICAL HISTORY:   Past Medical History:  Diagnosis Date  . Asthma   . Bilateral kidney stones   . COPD (chronic obstructive pulmonary disease) ([place])   . Diabetes mellitus   . Generalized convulsive epilepsy ([place])   . 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 Hematuria, gross   . Hydronephrosis   . Hypercholesterolemia   . Hypertension   . Hypothyroidism   . Nephrolithiasis   . Nephrolithiasis   . Seizures ([place])    last seizure 7-8 yrs ago  . Shortness of breath dyspnea    with exertion    PAST SURGICAL HISTORY:  Past Surgical History:  Procedure Laterality Date  . Bladder tack    . COLONOSCOPY    . COLONOSCOPY WITH PROPOFOL N/A 03/25/2018   Procedure: COLONOSCOPY WITH PROPOFOL;  Surgeon: Toledo, Benay Pike, MD;  Location: [facility] ENDOSCOPY;  Service: Gastroenterology;  Laterality: N/A;  . COLONOSCOPY WITH PROPOFOL N/A 05/13/2018   Procedure: COLONOSCOPY WITH PROPOFOL;  Surgeon: Toledo, Benay Pike, MD;  Location: [facility] ENDOSCOPY;  Service:  Gastroenterology;  Laterality: N/A;  . CYSTOSCOPY W/ URETERAL STENT PLACEMENT  03/11/15  . CYSTOSCOPY WITH RETROGRADE PYELOGRAM, URETEROSCOPY AND STENT PLACEMENT Left 03/17/2015   Procedure: CYSTOSCOPY, STENT REMOVAL, LEFT URETEROSCOPY  WITH STONE REMOVAL WITH BASKET;  Surgeon: Irine Seal, MD;  Location: [facility];  Service: Urology;  Laterality: Left;  . SHOULDER ARTHROSCOPY DISTAL CLAVICLE EXCISION AND OPEN ROTATOR CUFF REPAIR    . TUBAL LIGATION      SOCIAL HISTORY:  Social History   Tobacco Use  . Smoking status: Never Smoker  . Smokeless tobacco: Never Used  Substance Use Topics  . Alcohol use: No    FAMILY HISTORY:  Family History  Problem Relation Age of Onset  . Coronary artery disease Sister   . Heart disease Sister   . Brain cancer Father   . Emphysema Mother     DRUG ALLERGIES:  Allergies  Allergen Reactions  . Dilantin [Phenytoin Sodium Extended] Swelling  . Phenytoin Swelling    REVIEW OF SYSTEMS:   CONSTITUTIONAL: No fever, fatigue or weakness.  EYES: No blurred or double vision.  EARS, NOSE, AND THROAT: No tinnitus or ear pain.  RESPIRATORY:occasional cough, shortness of breath,  No wheezing or hemoptysis.  CARDIOVASCULAR: No chest pain, orthopnea, edema.  GASTROINTESTINAL: No nausea, vomiting, diarrhea or abdominal pain.  GENITOURINARY: No dysuria, hematuria.  ENDOCRINE: No polyuria, nocturia,  HEMATOLOGY: No anemia, easy bruising or bleeding SKIN: No rash or lesion. MUSCULOSKELETAL: No joint pain or arthritis.   NEUROLOGIC: No tingling, numbness, weakness.  Has  dizziness PSYCHIATRY: No anxiety or depression.   MEDICATIONS AT HOME:  Prior to Admission medications   Medication Sig Start Date End Date Taking? Authorizing Provider  albuterol (PROVENTIL HFA;VENTOLIN HFA) 108 (90 BASE) MCG/ACT inhaler Inhale 1 puff into the lungs every 6 (six) hours as needed for wheezing or shortness of breath.    [provider]  atorvastatin (LIPITOR) 40 MG  tablet Take 40 mg by mouth daily.    [provider]  clotrimazole-betamethasone (LOTRISONE) cream Apply 1 application topically 2 (two) times daily. 02/16/18   [name], MD  diclofenac sodium (VOLTAREN) 1 % GEL Apply 4 g topically 4 (four) times daily.    [provider]  divalproex (DEPAKOTE ER) 250 MG 24 hr tablet Take 500-750 mg by mouth 2 (two) times daily. Take 500mg  in morning and take 750mg  at night    [provider]  empagliflozin (JARDIANCE) 25 MG TABS tablet Take by mouth. 10/02/16   [provider]  fluticasone (FLONASE) 50 MCG/ACT nasal spray Place 2 sprays into both nostrils daily as needed for allergies or rhinitis.    [provider]  Fluticasone-Salmeterol (ADVAIR) 250-50 MCG/DOSE AEPB Inhale 1 puff into the lungs 2 (two) times daily.    [provider]  glimepiride (AMARYL) 2 MG tablet Take 2 mg by mouth daily with breakfast.    [provider]  losartan (COZAAR) 100 MG tablet Take 50 mg by mouth every morning.     [provider]  metFORMIN (GLUCOPHAGE-XR) 500 MG 24 hr tablet Take 2,000 mg by mouth daily with supper.     [provider]  mirabegron ER (MYRBETRIQ) 50 MG TB24 tablet Take 1 tablet (50 mg total) by mouth daily. 02/16/18   MacDiarmid, [name], MD  ondansetron (ZOFRAN ODT) 4 MG disintegrating tablet Take 1 tablet (4 mg total) by mouth every 8 (eight) hours as needed for nausea or vomiting. 12/21/18   [name], MD  oxyCODONE-acetaminophen (PERCOCET/ROXICET) 5-325 MG tablet Take 1 tablet by mouth every 4 (four) hours as needed for severe pain. 12/21/18   [name], MD  tamsulosin (FLOMAX) 0.4 MG CAPS capsule Take 1 capsule (0.4 mg total) by mouth daily. 12/21/18   [name], MD      PHYSICAL EXAMINATION:   VITAL SIGNS: Blood pressure (!) 149/79, pulse 94, temperature 97.8 F (36.6 C), temperature source Oral, resp. rate 16, SpO2 94 %.  GENERAL:  68 y.o.-year-old patient lying in  the bed with no acute distress.  EYES: Pupils equal, round, reactive to light and accommodation. No scleral icterus. Extraocular muscles intact.  HEENT: Head atraumatic, normocephalic. Oropharynx and nasopharynx clear.  NECK:  Supple, no jugular venous distention. No thyroid enlargement, no tenderness.  LUNGS: Decreased breath sounds bilaterally, rales heard in left lung. No use of accessory muscles of respiration.  CARDIOVASCULAR: S1, S2 normal. No murmurs, rubs, or gallops.  ABDOMEN: Soft, nontender, nondistended. Bowel sounds present. No organomegaly or mass.  EXTREMITIES: No pedal edema, cyanosis, or clubbing.  NEUROLOGIC: Cranial nerves II through XII are intact. Muscle strength 5/5 in all extremities. Sensation intact. Gait not checked.  PSYCHIATRIC: The patient is alert and oriented x 3.  SKIN: No obvious rash, lesion, or ulcer.   LABORATORY PANEL:   CBC Recent Labs  Lab 12/21/18 0014 12/23/18 1205  WBC 8.1 5.6  HGB 15.0 13.7  HCT 46.1* 42.5  PLT 202 177  MCV 91.1 93.2  MCH 29.6 30.0  MCHC 32.5 32.2  RDW  13.2 12.9  LYMPHSABS 2.8 1.4  MONOABS 0.8 0.5  EOSABS 0.1 0.0  BASOSABS 0.1 0.0   ------------------------------------------------------------------------------------------------------------------  Chemistries  Recent Labs  Lab 12/21/18 0014 12/23/18 1205  NA 137 137  K 3.7 3.4*  CL 102 104  CO2 25 21*  GLUCOSE 215* 287*  BUN 15 10  CREATININE 0.67 0.68  CALCIUM 9.1 8.7*  AST  --  19  ALT  --  14  ALKPHOS  --  58  BILITOT  --  0.6   ------------------------------------------------------------------------------------------------------------------ estimated creatinine clearance is 72.3 mL/min (by C-G formula based on SCr of 0.68 mg/dL). ------------------------------------------------------------------------------------------------------------------ No results for input(s): TSH, T4TOTAL, T3FREE, THYROIDAB in the last 72 hours.  Invalid input(s):  FREET3   Coagulation profile No results for input(s): INR, PROTIME in the last 168 hours. ------------------------------------------------------------------------------------------------------------------- No results for input(s): DDIMER in the last 72 hours. -------------------------------------------------------------------------------------------------------------------  Cardiac Enzymes Recent Labs  Lab 12/23/18 1205  TROPONINI <0.03   ------------------------------------------------------------------------------------------------------------------ Invalid input(s): POCBNP  ---------------------------------------------------------------------------------------------------------------  Urinalysis    Component Value Date/Time   COLORURINE COLORLESS (A) 12/23/2018 1205   APPEARANCEUR CLEAR (A) 12/23/2018 1205   APPEARANCEUR Clear 03/11/2015 2310   LABSPEC 1.023 12/23/2018 1205   LABSPEC 1.028 03/11/2015 2310   PHURINE 6.0 12/23/2018 1205   GLUCOSEU >=500 (A) 12/23/2018 1205   GLUCOSEU >=500 03/11/2015 2310   HGBUR NEGATIVE 12/23/2018 1205   BILIRUBINUR NEGATIVE 12/23/2018 1205   BILIRUBINUR Negative 03/11/2015 2310   KETONESUR 5 (A) 12/23/2018 1205   PROTEINUR NEGATIVE 12/23/2018 1205   UROBILINOGEN 1.0 09/19/2012 1920   NITRITE NEGATIVE 12/23/2018 1205   LEUKOCYTESUR NEGATIVE 12/23/2018 1205   LEUKOCYTESUR Negative 03/11/2015 2310     RADIOLOGY: Dg Chest 2 View  Result Date: 12/23/2018 CLINICAL DATA:  Shortness of breath, dizziness EXAM: CHEST - 2 VIEW COMPARISON:  09/14/2016 FINDINGS: There is hazy left lower lobe airspace disease which may reflect atelectasis versus pneumonia. There is no pleural effusion or pneumothorax. The heart and mediastinal contours are unremarkable. The osseous structures are unremarkable. IMPRESSION: Hazy left lower lobe airspace disease which may reflect atelectasis versus pneumonia. Electronically Signed   By: Kathreen Devoid   On: 12/23/2018  12:29    EKG: Orders placed or performed during the hospital encounter of 11/14/16  . ED EKG  . ED EKG  . EKG 12-Lead  . EKG 12-Lead  . EKG    IMPRESSION AND PLAN: 68 year old female patient with a known history of COPD, diabetes mellitus type 2, epilepsy, nephrolithiasis, hydronephrosis, hypertension, hypothyroidism presented to the emergency room for weakness and dizziness.    -Sepsis secondary to pneumonia Admit patient to medical floor Start patient on IV vancomycin and cefepime antibiotics IV fluids and follow-up cultures Follow-up lactic acid level  -Left lung pneumonia Continue IV vancomycin and cefepime antibiotics  -COPD stable Continue nebulization treatments  -Type 2 diabetes mellitus Diabetic diet Oral diabetic meds to continue Sliding scale coverage with insulin  -DVT prophylaxis with subcu Lovenox daily  All the records are reviewed and case discussed with ED provider. Management plans discussed with the patient, family and they are in agreement.  CODE STATUS:Full code    TOTAL TIME TAKING CARE OF THIS PATIENT: 52 minutes.    Saundra Shelling M.D on 12/23/2018 at 1:54 PM  Between 7am to 6pm - Pager - [phone]  After 6pm go to [email or web address] - password [email or web address] [facility]  [facility]  Office  [phone]  CC: Primary care physician; [name], MD

## 2018-12-23 NOTE — ED Notes (Signed)
Gerald Stabs Camera operator stated Tiffany receiving nurse will come down to ed to pick up patient once EVS had made the bed up.

## 2018-12-24 DIAGNOSIS — J189 Pneumonia, unspecified organism: Secondary | ICD-10-CM | POA: Diagnosis present

## 2018-12-24 LAB — BASIC METABOLIC PANEL WITH GFR
Anion gap: 7 (ref 5–15)
BUN: 9 mg/dL (ref 8–23)
CO2: 21 mmol/L — ABNORMAL LOW (ref 22–32)
Calcium: 7.6 mg/dL — ABNORMAL LOW (ref 8.9–10.3)
Chloride: 109 mmol/L (ref 98–111)
Creatinine, Ser: 0.47 mg/dL (ref 0.44–1.00)
GFR calc Af Amer: >60 mL/min
GFR calc non Af Amer: >60 mL/min
Glucose, Bld: 118 mg/dL — ABNORMAL HIGH (ref 70–99)
Potassium: 3.3 mmol/L — ABNORMAL LOW (ref 3.5–5.1)
Sodium: 137 mmol/L (ref 135–145)

## 2018-12-24 LAB — CBC
HCT: 36.8 % (ref 36.0–46.0)
Hemoglobin: 11.7 g/dL — ABNORMAL LOW (ref 12.0–15.0)
MCH: 29.9 pg (ref 26.0–34.0)
MCHC: 31.8 g/dL (ref 30.0–36.0)
MCV: 94.1 fL (ref 80.0–100.0)
Platelets: 154 10*3/uL (ref 150–400)
RBC: 3.91 MIL/uL (ref 3.87–5.11)
RDW: 13.2 % (ref 11.5–15.5)
WBC: 5.4 10*3/uL (ref 4.0–10.5)
nRBC: 0 % (ref 0.0–0.2)

## 2018-12-24 LAB — GLUCOSE, CAPILLARY
GLUCOSE-CAPILLARY: 98 mg/dL (ref 70–99)
Glucose-Capillary: 108 mg/dL — ABNORMAL HIGH (ref 70–99)

## 2018-12-24 LAB — HIV ANTIBODY (ROUTINE TESTING W REFLEX): HIV Screen 4th Generation wRfx: NONREACTIVE

## 2018-12-24 MED ORDER — POTASSIUM CHLORIDE CRYS ER 20 MEQ PO TBCR
40.0000 meq | EXTENDED_RELEASE_TABLET | Freq: Once | ORAL | Status: AC
  Administered 2018-12-24: 13:00:00 40 meq via ORAL
  Filled 2018-12-24: qty 2

## 2018-12-24 MED ORDER — CEFUROXIME AXETIL 250 MG PO TABS: 250.0000 mg | ORAL_TABLET | Freq: Two times a day (BID) | ORAL | 0 refills | Status: AC

## 2018-12-24 MED ORDER — SODIUM CHLORIDE 0.9 % IV SOLN
2.0000 g | INTRAVENOUS | Status: DC
  Administered 2018-12-24: 13:00:00 2 g via INTRAVENOUS
  Filled 2018-12-24: qty 20
  Filled 2018-12-24: qty 2

## 2018-12-24 MED ORDER — IPRATROPIUM-ALBUTEROL 0.5-2.5 (3) MG/3ML IN SOLN: 3.0000 mL | Freq: Four times a day (QID) | RESPIRATORY_TRACT | Status: DC | PRN

## 2018-12-24 NOTE — Care Management Obs Status (Signed)
[facility] NOTIFICATION   Patient Details  Name: MAZZY SANTARELLI MRN: [id number] Date of Birth: 23-Dec-1950   Medicare Observation Status Notification Given:  Yes    Shelbie Ammons, RN 12/24/2018, 3:07 PM

## 2018-12-24 NOTE — Care Management CC44 (Signed)
Condition Code 44 Documentation Completed  Patient Details  Name: Crystal Foley MRN: [id number] Date of Birth: 12-21-1950   Condition Code 44 given:  Yes Patient signature on Condition Code 44 notice:  Yes Documentation of 2 MD's agreement:  Yes Code 44 added to claim:  Yes    Shelbie Ammons, RN 12/24/2018, 3:07 PM

## 2018-12-28 LAB — CULTURE, BLOOD (ROUTINE X 2)
Culture: NO GROWTH
Culture: NO GROWTH
Special Requests: ADEQUATE
Special Requests: ADEQUATE

## 2018-12-29 DIAGNOSIS — I700 Atherosclerosis of aorta: Secondary | ICD-10-CM | POA: Insufficient documentation

## 2019-01-06 NOTE — Discharge Summary (Signed)
[facility] NAME: Crystal Foley    MR#:  [id number]  DATE OF BIRTH:  23-Jul-1951  DATE OF ADMISSION:  12/23/2018 ADMITTING PHYSICIAN: Saundra Shelling, MD  DATE OF DISCHARGE: 12/24/2018  3:40 PM  PRIMARY CARE PHYSICIAN: [name], MD    ADMISSION DIAGNOSIS:  Shortness of breath [R06.02] Pneumonia due to infectious organism, unspecified laterality, unspecified part of lung [J18.9]  DISCHARGE DIAGNOSIS:  Active Problems:   Sepsis ([place])   Community acquired pneumonia   SECONDARY DIAGNOSIS:   Past Medical History:  Diagnosis Date  . Asthma   . Bilateral kidney stones   . COPD (chronic obstructive pulmonary disease) ([place])   . Diabetes mellitus   . Generalized convulsive epilepsy ([place])   . 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 Hematuria, gross   . Hydronephrosis   . Hypercholesterolemia   . Hypertension   . Hypothyroidism   . Nephrolithiasis   . Nephrolithiasis   . Seizures ([place])    last seizure 7-8 yrs ago  . Shortness of breath dyspnea    with exertion    HOSPITAL COURSE:   68 year old female patient with a known history of COPD, diabetes mellitus type 2, epilepsy, nephrolithiasis, hydronephrosis, hypertension, hypothyroidism presented to the emergency room for weakness and dizziness.    -Sepsis secondary to pneumonia  Start patient on IV vancomycin and cefepime antibiotics IV fluids and follow-up cultures Follow-up lactic acid level Pt much improved next day.  -Left lung pneumonia Continue IV vancomycin and cefepime antibiotics  -COPD stable Continue nebulization treatments  -Type 2 diabetes mellitus Diabetic diet Oral diabetic meds to continue Sliding scale coverage with insulin  -DVT prophylaxis with subcu Lovenox daily  DISCHARGE CONDITIONS:   Stable.  CONSULTS OBTAINED:    DRUG ALLERGIES:   Allergies  Allergen Reactions  . Dilantin  [Phenytoin Sodium Extended] Swelling  . Phenytoin Swelling    DISCHARGE MEDICATIONS:   Allergies as of 12/24/2018      Reactions   Dilantin [phenytoin Sodium Extended] Swelling   Phenytoin Swelling      Medication List    STOP taking these medications   tamsulosin 0.4 MG Caps capsule Commonly known as:  FLOMAX     TAKE these medications   albuterol 108 (90 Base) MCG/ACT inhaler Commonly known as:  PROVENTIL HFA;VENTOLIN HFA Inhale 1 puff into the lungs every 6 (six) hours as needed for wheezing or shortness of breath.   atorvastatin 40 MG tablet Commonly known as:  LIPITOR Take 40 mg by mouth daily.   clotrimazole-betamethasone cream Commonly known as:  LOTRISONE Apply 1 application topically 2 (two) times daily.   diclofenac sodium 1 % Gel Commonly known as:  VOLTAREN Apply 4 g topically 4 (four) times daily.   divalproex 250 MG 24 hr tablet Commonly known as:  DEPAKOTE ER Take 500-750 mg by mouth 2 (two) times daily. Take 500mg  in morning and take 750mg  at night   empagliflozin 25 MG Tabs tablet Commonly known as:  JARDIANCE Take by mouth. Notes to patient:  Resume home dose   fluticasone 50 MCG/ACT nasal spray Commonly known as:  FLONASE Place 2 sprays into both nostrils daily as needed for allergies or rhinitis.   Fluticasone-Salmeterol 250-50 MCG/DOSE Aepb Commonly known as:  ADVAIR Inhale 1 puff into the lungs 2 (two) times daily.   glimepiride 2 MG tablet Commonly known as:  AMARYL Take 2 mg by mouth daily with breakfast.  losartan 100 MG tablet Commonly known as:  COZAAR Take 50 mg by mouth every morning.   metFORMIN 500 MG 24 hr tablet Commonly known as:  GLUCOPHAGE-XR Take 2,000 mg by mouth daily with supper.   mirabegron ER 50 MG Tb24 tablet Commonly known as:  MYRBETRIQ Take 1 tablet (50 mg total) by mouth daily.   ondansetron 4 MG disintegrating tablet Commonly known as:  ZOFRAN ODT Take 1 tablet (4 mg total) by mouth every 8 (eight)  hours as needed for nausea or vomiting.   oxyCODONE-acetaminophen 5-325 MG tablet Commonly known as:  PERCOCET/ROXICET Take 1 tablet by mouth every 4 (four) hours as needed for severe pain.     ASK your doctor about these medications   cefUROXime 250 MG tablet Commonly known as:  CEFTIN Take 1 tablet (250 mg total) by mouth 2 (two) times daily for 3 days. Ask about: Should I take this medication?        DISCHARGE INSTRUCTIONS:    Stable.  If you experience worsening of your admission symptoms, develop shortness of breath, life threatening emergency, suicidal or homicidal thoughts you must seek medical attention immediately by calling 911 or calling your MD immediately  if symptoms less severe.  You Must read complete instructions/literature along with all the possible adverse reactions/side effects for all the Medicines you take and that have been prescribed to you. Take any new Medicines after you have completely understood and accept all the possible adverse reactions/side effects.   Please note  You were cared for by a hospitalist during your hospital stay. If you have any questions about your discharge medications or the care you received while you were in the hospital after you are discharged, you can call the unit and asked to speak with the hospitalist on call if the hospitalist that took care of you is not available. Once you are discharged, your primary care physician will handle any further medical issues. Please note that NO REFILLS for any discharge medications will be authorized once you are discharged, as it is imperative that you return to your primary care physician (or establish a relationship with a primary care physician if you do not have one) for your aftercare needs so that they can reassess your need for medications and monitor your lab values.    Today   CHIEF COMPLAINT:  No chief complaint on file.   HISTORY OF PRESENT ILLNESS:  Crystal Foley  is a 68  y.o. female with a known history of COPD, diabetes mellitus type 2, epilepsy, nephrolithiasis, hydronephrosis, hypertension, hypothyroidism presented to the emergency room for weakness and dizziness.  Patient also has occasional cough.  She recently passed kidney stone and was worked up with CT kidney stone protocol 2 days ago.  He was evaluated in the emergency room lactic acid was 6 and chest x-ray revealed pneumonia.  No recent travel or sick contacts at home.  Flu test is negative.  Patient started on broad-spectrum antibiotics and hospitalist service was consulted.    VITAL SIGNS:  Blood pressure 134/65, pulse 81, temperature 98.3 F (36.8 C), temperature source Oral, resp. rate 18, height 5\' 6"  (1.676 m), SpO2 94 %.  I/O:  No intake or output data in the 24 hours ending 01/06/19 2051  PHYSICAL EXAMINATION:  GENERAL:  68 y.o.-year-old patient lying in the bed with no acute distress.  EYES: Pupils equal, round, reactive to light and accommodation. No scleral icterus. Extraocular muscles intact.  HEENT: Head atraumatic, normocephalic. Oropharynx and  nasopharynx clear.  NECK:  Supple, no jugular venous distention. No thyroid enlargement, no tenderness.  LUNGS: Normal breath sounds bilaterally, no wheezing, rales,rhonchi or crepitation. No use of accessory muscles of respiration.  CARDIOVASCULAR: S1, S2 normal. No murmurs, rubs, or gallops.  ABDOMEN: Soft, non-tender, non-distended. Bowel sounds present. No organomegaly or mass.  EXTREMITIES: No pedal edema, cyanosis, or clubbing.  NEUROLOGIC: Cranial nerves II through XII are intact. Muscle strength 5/5 in all extremities. Sensation intact. Gait not checked.  PSYCHIATRIC: The patient is alert and oriented x 3.  SKIN: No obvious rash, lesion, or ulcer.   DATA REVIEW:   CBC No results for input(s): WBC, HGB, HCT, PLT in the last 168 hours.  Chemistries  No results for input(s): NA, K, CL, CO2, GLUCOSE, BUN, CREATININE, CALCIUM, MG,  AST, ALT, ALKPHOS, BILITOT in the last 168 hours.  Invalid input(s): GFRCGP  Cardiac Enzymes No results for input(s): TROPONINI in the last 168 hours.  Microbiology Results  Results for orders placed or performed during the hospital encounter of 12/23/18  Blood Culture (routine x 2)     Status: None   Collection Time: 12/23/18 12:05 PM  Result Value Ref Range Status   Specimen Description BLOOD RIGHT ANTECUBITAL  Final   Special Requests   Final    BOTTLES DRAWN AEROBIC AND ANAEROBIC Blood Culture adequate volume   Culture   Final    NO GROWTH 5 DAYS Performed at [facility], [address] [place], [place] [address]    Report Status 12/28/2018 FINAL  Final  Blood Culture (routine x 2)     Status: None   Collection Time: 12/23/18 12:47 PM  Result Value Ref Range Status   Specimen Description BLOOD LEFT AC  Final   Special Requests   Final    BOTTLES DRAWN AEROBIC AND ANAEROBIC Blood Culture adequate volume   Culture   Final    NO GROWTH 5 DAYS Performed at [facility], [address], [place], [place] [address]    Report Status 12/28/2018 FINAL  Final  MRSA PCR Screening     Status: None   Collection Time: 12/23/18  7:46 PM  Result Value Ref Range Status   MRSA by PCR NEGATIVE NEGATIVE Final    Comment:        The GeneXpert MRSA Assay (FDA approved for NASAL specimens only), is one component of a comprehensive MRSA colonization surveillance program. It is not intended to diagnose MRSA infection nor to guide or monitor treatment for MRSA infections. Performed at [facility], [address] [place], [place] [address]     RADIOLOGY:  No results found.  EKG:   Orders placed or performed during the hospital encounter of 11/14/16  . ED EKG  . ED EKG  . EKG 12-Lead  . EKG 12-Lead  . EKG      Management plans discussed with the patient, family and they are in agreement.  CODE STATUS:  Code Status History    Date Active  Date Inactive Code Status Order ID Comments User Context   12/23/2018 1715 12/24/2018 1852 Full Code [id number]  Saundra Shelling, MD ED    Advance Directive Documentation     Most Recent Value  Type of Advance Directive  Healthcare Power of Attorney, Living will  Pre-existing out of facility DNR order (yellow form or pink MOST form)  -  "MOST" Form in Place?  -      TOTAL TIME TAKING CARE OF THIS PATIENT:  35 minutes.    Vaughan Basta M.D on 01/06/2019 at 8:51 PM  Between 7am to 6pm - Pager - [phone]  After 6pm go to [email or web address] - password [email or web address] [facility]  Office  [phone]  CC: Primary care physician; [name], MD   Note: This dictation was prepared with Dragon dictation along with smaller phrase technology. Any transcriptional errors that result from this process are unintentional.

## 2019-01-11 ENCOUNTER — Ambulatory Visit (INDEPENDENT_AMBULATORY_CARE_PROVIDER_SITE_OTHER): Payer: Medicare Other | Admitting: Podiatry

## 2019-01-11 ENCOUNTER — Encounter: Payer: Self-pay | Admitting: Podiatry

## 2019-01-11 DIAGNOSIS — E119 Type 2 diabetes mellitus without complications: Secondary | ICD-10-CM | POA: Diagnosis not present

## 2019-01-11 DIAGNOSIS — M79609 Pain in unspecified limb: Secondary | ICD-10-CM

## 2019-01-11 DIAGNOSIS — B351 Tinea unguium: Secondary | ICD-10-CM

## 2019-01-11 NOTE — Progress Notes (Signed)
Complaint:  Visit Type: Patient returns to my office for continued preventative foot care services. Complaint: Patient states" my nails have grown long and thick and become painful to walk and wear shoes" Patient has been diagnosed with DM with no foot complications. The patient presents for preventative foot care services. No changes to ROS  Podiatric Exam: Vascular: dorsalis pedis and posterior tibial pulses are palpable bilateral. Capillary return is immediate. Temperature gradient is WNL. Skin turgor WNL  Sensorium: Normal Semmes Weinstein monofilament test. Normal tactile sensation bilaterally. Nail Exam: Pt has thick disfigured discolored nails with subungual debris noted bilateral entire nail hallux through fifth toenails Ulcer Exam: There is no evidence of ulcer or pre-ulcerative changes or infection. Orthopedic Exam: Muscle tone and strength are WNL. No limitations in general ROM. No crepitus or effusions noted. HAV  B/L. Skin: No Porokeratosis. No infection or ulcers  Diagnosis:  Onychomycosis, , Pain in right toe, pain in left toes  Treatment & Plan Procedures and Treatment: Consent by patient was obtained for treatment procedures. The patient understood the discussion of treatment and procedures well. All questions were answered thoroughly reviewed. Debridement of mycotic and hypertrophic toenails, 1 through 5 bilateral and clearing of subungual debris. No ulceration, no infection noted.  Return Visit-Office Procedure: Patient instructed to return to the office for a follow up visit 3 months for continued evaluation and treatment.    Lexus Shampine DPM 

## 2019-02-16 NOTE — Progress Notes (Signed)
02/17/2019 10:48 AM   Crystal Foley 1950-12-22 [id number]  Referring provider: [name], MD [facility] [place], [place] [address]  Chief Complaint  Patient presents with  . Urge incontinence    HPI: Crystal Foley is a 68 y.o. female Caucasian with a history of nephrolithiasis, vaginal atrophy and incontinence who presents today for an annual follow up.    History of nephrolithiasis Patient has a history of stones.  Patient was seen at [facility] ED on 03/12/2015 for a 9 mm left UPJ stone with intractable pain and vomiting.  Dr. Jeffie Pollock placed a left ureteral stent at that time and she followed up at [facility] for definitive treatment of the stone.  Dr. Jeffie Pollock performed a left ureteroscopic stone extraction and cystoscopy with removal of left double-J stent on 03/17/2015.  RUS on 06/14/2015 noted no hydronephrosis and no stones.  24 hour urine results indicate a low urinary volume and increased oxalates.   On 12/21/2018, the patient presented to the emergency department with nephrolithisis, gross hematuria, and dysuria.  A CT renal stone study on 12/21/2018 reported the impression of:  1. Mild left hydroureteronephrosis. There is a 4 mm stone in the dependent bladder, likely recently passed stone. No additional ureteral or obstructing calculi.  2. Nonobstructing stones in both kidneys. Parapelvic cysts in both Kidneys.  3. Minimal distal colonic diverticulosis without diverticulitis.  4. Aortic Atherosclerosis (ICD10-I70.0).  She ended up returning to nephrolithisis with sepsis and pneumonia in 12/23/2018.  Vaginal atrophy She is not using the vaginal estrogen cream at this time.    Incontinence Patient on 12/04/2016 was having a very great deal of an uncomfortable urge to urinate and urine loss associated with a strong desire to urinate.  She was bothered a very great deal with a sudden urge to urinate, accidental loss of small amounts of urine, night  time urination and waking up at night to urinate.  She was having to use the restroom 12 to 14 times daily.  She was having 1 episode at night.  She was having urge incontinence and stress incontinence.  She was wearing three depends daily.  She has not had recurrent UTI's.  She was having leakage during sex and had not had sex for three months.  She was not going to Christmas parties due to the leakage.   Patient had a bladder tacking 20 years ago prior to her 11/2016 visit, and was very satisfied with those results.  Patient was placed on Myrbetriq 50 mg by Dr. Matilde Sprang and Lotrisone cream 15 g, and reported significant improvement in symptoms.  Today, she feels that she is having worsening of her incontinence.  Today (02/17/2019), the patient is experiencing urgency x 0-3, frequency x 4-7, not restricting fluids to avoid visits to the restroom 0-3, not engaging in toilet mapping, incontinence x 4-7 and nocturia x 0-3.  Her BP is 155/80.  Her PVR is 14 mL.    Patients reports that she has been wearing pads all the time.  She also, however, reports that on a recent vacation she had her incontinece completely cease; her nocturia and frequency continued.  She does admit that she was more conscious about voiding regularly while away, and here will sit reading or knitting for a couple hours without going.  The intervals may not have been different, however.  Patient says she thinks she's going through 3 pads a day, though usually only one is really wet.  PMH: Past Medical History:  Diagnosis Date  . Asthma   . Bilateral kidney stones   . COPD (chronic obstructive pulmonary disease) ([place])   . Diabetes mellitus   . Generalized convulsive epilepsy ([place])   . 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 Hematuria, gross   . Hydronephrosis   . Hypercholesterolemia   . Hypertension   . Hypothyroidism   . Nephrolithiasis   . Nephrolithiasis   . Seizures ([place])    last seizure  7-8 yrs ago  . Shortness of breath dyspnea    with exertion    Surgical History: Past Surgical History:  Procedure Laterality Date  . Bladder tack    . COLONOSCOPY    . COLONOSCOPY WITH PROPOFOL N/A 03/25/2018   Procedure: COLONOSCOPY WITH PROPOFOL;  Surgeon: Toledo, Benay Pike, MD;  Location: [facility] ENDOSCOPY;  Service: Gastroenterology;  Laterality: N/A;  . COLONOSCOPY WITH PROPOFOL N/A 05/13/2018   Procedure: COLONOSCOPY WITH PROPOFOL;  Surgeon: Toledo, Benay Pike, MD;  Location: [facility] ENDOSCOPY;  Service: Gastroenterology;  Laterality: N/A;  . CYSTOSCOPY W/ URETERAL STENT PLACEMENT  03/11/15  . CYSTOSCOPY WITH RETROGRADE PYELOGRAM, URETEROSCOPY AND STENT PLACEMENT Left 03/17/2015   Procedure: CYSTOSCOPY, STENT REMOVAL, LEFT URETEROSCOPY  WITH STONE REMOVAL WITH BASKET;  Surgeon: Irine Seal, MD;  Location: [facility] ORS;  Service: Urology;  Laterality: Left;  . SHOULDER ARTHROSCOPY DISTAL CLAVICLE EXCISION AND OPEN ROTATOR CUFF REPAIR    . TUBAL LIGATION      Home Medications:  Allergies as of 02/17/2019      Reactions   Dilantin [phenytoin Sodium Extended] Swelling   Phenytoin Swelling      Medication List       Accurate as of February 17, 2019 10:48 AM. Always use your most recent med list.        albuterol 108 (90 Base) MCG/ACT inhaler Commonly known as:  PROVENTIL HFA;VENTOLIN HFA Inhale 1 puff into the lungs every 6 (six) hours as needed for wheezing or shortness of breath.   atorvastatin 40 MG tablet Commonly known as:  LIPITOR Take 40 mg by mouth daily.   clotrimazole-betamethasone cream Commonly known as:  LOTRISONE Apply 1 application topically 2 (two) times daily.   conjugated estrogens vaginal cream Commonly known as:  PREMARIN Apply 0.5mg  (pea-sized amount)  just inside the vaginal introitus with a finger-tip on  Monday, Wednesday and Friday nights.   diclofenac sodium 1 % Gel Commonly known as:  VOLTAREN Apply 4 g topically 4 (four) times daily.   divalproex 250 MG 24  hr tablet Commonly known as:  DEPAKOTE ER Take 500-750 mg by mouth 2 (two) times daily. Take 500mg  in morning and take 750mg  at night   empagliflozin 25 MG Tabs tablet Commonly known as:  JARDIANCE Take by mouth.   estradiol 0.1 MG/GM vaginal cream Commonly known as:  ESTRACE VAGINAL Apply 0.5mg  (pea-sized amount)  just inside the vaginal introitus with a finger-tip on Monday, Wednesday and Friday nights.   fluticasone 50 MCG/ACT nasal spray Commonly known as:  FLONASE Place 2 sprays into both nostrils daily as needed for allergies or rhinitis.   Fluticasone-Salmeterol 250-50 MCG/DOSE Aepb Commonly known as:  ADVAIR Inhale 1 puff into the lungs 2 (two) times daily.   glimepiride 2 MG tablet Commonly known as:  AMARYL Take 2 mg by mouth daily with breakfast.   losartan 100 MG tablet Commonly known as:  COZAAR Take 50 mg by mouth every morning.   metFORMIN 500 MG 24 hr  tablet Commonly known as:  GLUCOPHAGE-XR Take 2,000 mg by mouth daily with supper.   mirabegron ER 50 MG Tb24 tablet Commonly known as:  MYRBETRIQ Take 1 tablet (50 mg total) by mouth daily.   nystatin cream Commonly known as:  MYCOSTATIN Apply 1 application topically 2 (two) times daily.   ondansetron 4 MG disintegrating tablet Commonly known as:  ZOFRAN ODT Take 1 tablet (4 mg total) by mouth every 8 (eight) hours as needed for nausea or vomiting.   oxyCODONE-acetaminophen 5-325 MG tablet Commonly known as:  PERCOCET/ROXICET Take 1 tablet by mouth every 4 (four) hours as needed for severe pain.       Allergies:  Allergies  Allergen Reactions  . Dilantin [Phenytoin Sodium Extended] Swelling  . Phenytoin Swelling    Family History: Family History  Problem Relation Age of Onset  . Coronary artery disease Sister   . Heart disease Sister   . Brain cancer Father   . Emphysema Mother     Social History:  reports that she has never smoked. She has never used smokeless tobacco. She reports that  she does not drink alcohol or use drugs.  ROS: Urological Symptom Review UROLOGY Frequent Urination?: Yes Hard to postpone urination?: Yes Burning/pain with urination?: No Get up at night to urinate?: Yes Leakage of urine?: Yes Urine stream starts and stops?: No Trouble starting stream?: No Do you have to strain to urinate?: No Blood in urine?: No Urinary tract infection?: No Sexually transmitted disease?: No Injury to kidneys or bladder?: No Painful intercourse?: No Weak stream?: No Currently pregnant?: No Vaginal bleeding?: No Last menstrual period?: n Gastrointestinal Nausea?: No Vomiting?: No Indigestion/heartburn?: No Diarrhea?: No Constipation?: No Constitutional Fever: No Night sweats?: No Weight loss?: No Fatigue?: No Skin Skin rash/lesions?: No Itching?: No Eyes Blurred vision?: No Double vision?: No Ears/Nose/Throat Sore throat?: No Sinus problems?: No Hematologic/Lymphatic Swollen glands?: No Easy bruising?: No Cardiovascular Leg swelling?: No Chest pain?: No Respiratory Cough?: No Shortness of breath?: No Endocrine Excessive thirst?: No Musculoskeletal Back pain?: No Joint pain?: No Neurological Headaches?: No Dizziness?: No Psychologic Depression?: No Anxiety?: No  Physical Exam: BP (!) 155/80 (BP Location: Left Arm, Patient Position: Sitting)   Pulse 96   Ht 5\' 4"  (1.626 m)   Wt 185 lb (83.9 kg)   BMI 31.76 kg/m   Constitutional: Well nourished. Alert and oriented, No acute distress. HEENT: [place] AT, moist mucus membranes.  Trachea midline.   Cardiovascular: No clubbing, cyanosis, or edema. Respiratory: Normal respiratory effort, no increased work of breathing. GI: Abdomen is soft, non tender, non distended, no abdominal masses. Liver and spleen not palpable.  No hernias appreciated.  Stool sample for occult testing is not indicated. GU: No CVA tenderness.  No bladder fullness or masses.  Atrophic external genitalia, external labia  are pink, but not tender, sparse pubic hair distribution, no lesions.  Normal urethral meatus, no lesions, no prolapse, no discharge.  No urethral masses, tenderness and/or tenderness.  No bladder fullness, tenderness or masses. pale vagina mucosa, poor estrogen effect, no discharge, no lesions, poor pelvic support, grade 2 cystocele and a rectocele noted.  No cervical motion tenderness.  Uterus is freely mobile and non-fixed.  No adnexal/parametria masses or tenderness noted.  Anus and perineum are without rashes or lesions. Skin: No rashes, bruises or suspicious lesions. Lymph: No inguinal adenopathy. Neurologic: Grossly intact, no focal deficits, moving all 4 extremities. Psychiatric: Normal mood and affect.   Laboratory Data: Lab Results  Component  Value Date   WBC 5.4 12/24/2018   HGB 11.7 (L) 12/24/2018   HCT 36.8 12/24/2018   MCV 94.1 12/24/2018   PLT 154 12/24/2018    Lab Results  Component Value Date   CREATININE 0.47 12/24/2018   Lab Results  Component Value Date   AST 19 12/23/2018   Lab Results  Component Value Date   ALT 14 12/23/2018   Urinalysis    Component Value Date/Time   COLORURINE COLORLESS (A) 12/23/2018 1205   APPEARANCEUR CLEAR (A) 12/23/2018 1205   APPEARANCEUR Clear 03/11/2015 2310   LABSPEC 1.023 12/23/2018 1205   LABSPEC 1.028 03/11/2015 2310   PHURINE 6.0 12/23/2018 1205   GLUCOSEU >=500 (A) 12/23/2018 1205   GLUCOSEU >=500 03/11/2015 2310   HGBUR NEGATIVE 12/23/2018 1205   BILIRUBINUR NEGATIVE 12/23/2018 1205   BILIRUBINUR Negative 03/11/2015 2310   KETONESUR 5 (A) 12/23/2018 1205   PROTEINUR NEGATIVE 12/23/2018 1205   UROBILINOGEN 1.0 09/19/2012 1920   NITRITE NEGATIVE 12/23/2018 1205   LEUKOCYTESUR NEGATIVE 12/23/2018 1205   LEUKOCYTESUR Negative 03/11/2015 2310   I have reviewed the labs.  Pertinent Imaging:  Results for orders placed during the hospital encounter of 12/21/18  CT Renal Stone Study   Narrative CLINICAL DATA:  Left  flank pain. Hematuria.  EXAM: CT ABDOMEN AND PELVIS WITHOUT CONTRAST  TECHNIQUE: Multidetector CT imaging of the abdomen and pelvis was performed following the standard protocol without IV contrast.  COMPARISON:  CT 03/11/2015  FINDINGS: Lower chest: Mild elevation of left hemidiaphragm. No pleural fluid or consolidation.  Hepatobiliary: No focal liver abnormality is seen. No gallstones, gallbladder wall thickening, or biliary dilatation.  Pancreas: No ductal dilatation or inflammation.  Spleen: Normal in size without focal abnormality.  Adrenals/Urinary Tract: Normal adrenal glands. Left hydronephrosis and prominence of the ureter without ureteral calculi. There is a 4 mm stone in the dependent bladder likely recently passed stone. Distal cystic changes the left renal hilum are likely parapelvic cysts. There are clustered nonobstructing stones in the upper left kidney. Stable appearance of the right kidney with parapelvic cysts versus UPJ obstruction. Parapelvic cysts are favored. Punctate nonobstructing stones in the lower right kidney. The right ureter is decompressed. 4 mm stone in the dependent bladder, no bladder wall thickening.  Stomach/Bowel: Stomach physiologically distended. Ingested material within the distal duodenum and proximal jejunum without obstruction or inflammation. More distal small bowel is decompressed. Moderate volume of stool throughout the colon, with areas of interspersed decompressed sigmoid colon. Significant distal colonic tortuosity. Minimal diverticulosis of the distal colon without diverticulitis. Ovoid fat density abutting the anti mesenteric border of the tortuous sigmoid colon may represent sequela of prior epiploic appendagitis. No acute inflammatory change. Normal appendix.  Vascular/Lymphatic: Mild aorta bi-iliac atherosclerosis without aneurysm. No adenopathy.  Reproductive: Uterus and bilateral adnexa are unremarkable.  Other:  No free air, free fluid, or intra-abdominal fluid collection.  Musculoskeletal: There are no acute or suspicious osseous abnormalities.  IMPRESSION: 1. Mild left hydroureteronephrosis. There is a 4 mm stone in the dependent bladder, likely recently passed stone. No additional ureteral or obstructing calculi. 2. Nonobstructing stones in both kidneys. Parapelvic cysts in both kidneys. 3. Minimal distal colonic diverticulosis without diverticulitis. 4.  Aortic Atherosclerosis (ICD10-I70.0).   Electronically Signed   By: Keith Rake M.D.   On: 12/21/2018 00:54    I have independently reviewed the films and appreciate the hydronephrosis on the left along with the parapelvic cysts.   Assessment & Plan:    1. Left  ureteral stone Passed spontaneously - did not collect fragment   2. Left hydronephrosis obtain RUS to ensure the hydronephrosis has resolved   3. Gross hematuria Continue to monitor the patient's UA after the treatment/passage of the stone to ensure the hematuria has resolved If hematuria persists, we will pursue a hematuria workup with CT Urogram and cystoscopy if appropriate.  4. Urge incontinence - Myrbetriq 50 mg daily has proven effective, but some bothersome symptoms remain - Explained that surgery does not address urge incontinence and may worsen the urgency - Discussed additional treatment options, such as physical therapy, anticholingerics, and PTNS as the Myrbetriq may only treat part of the cause of her urge incontinence - Patient is interested in PTNS.  Office will check with patient's insurance regarding coverage.  5. Vaginal atrophy - Will resume estrogen cream 3 x a week (MWF); sample of Premarin was provided - Patient will switch to antifungal from steroid cream as she has been applying the steroid cream for one year; prescription sent in   Return for check with insurance for PTNS.  [name], PA-C  [facility] [address], [place], [place] [address] [phone]  I, Adele Schilder, am acting as a Education administrator for [employer], PA-C.   I have reviewed the above documentation for accuracy and completeness, and I agree with the above.    [name], PA-C

## 2019-02-17 ENCOUNTER — Ambulatory Visit (INDEPENDENT_AMBULATORY_CARE_PROVIDER_SITE_OTHER): Payer: Medicare Other | Admitting: Urology

## 2019-02-17 ENCOUNTER — Encounter: Payer: Self-pay | Admitting: Urology

## 2019-02-17 VITALS — BP 155/80 | HR 96 | Ht 64.0 in | Wt 185.0 lb

## 2019-02-17 DIAGNOSIS — N201 Calculus of ureter: Secondary | ICD-10-CM

## 2019-02-17 DIAGNOSIS — N952 Postmenopausal atrophic vaginitis: Secondary | ICD-10-CM

## 2019-02-17 DIAGNOSIS — R310 Gross hematuria: Secondary | ICD-10-CM

## 2019-02-17 DIAGNOSIS — N132 Hydronephrosis with renal and ureteral calculous obstruction: Secondary | ICD-10-CM

## 2019-02-17 DIAGNOSIS — N3941 Urge incontinence: Secondary | ICD-10-CM | POA: Diagnosis not present

## 2019-02-17 LAB — BLADDER SCAN AMB NON-IMAGING

## 2019-02-17 MED ORDER — NYSTATIN 100000 UNIT/GM EX CREA: 1.0000 "application " | TOPICAL_CREAM | Freq: Two times a day (BID) | CUTANEOUS | 0 refills | Status: DC

## 2019-02-17 MED ORDER — ESTROGENS, CONJUGATED 0.625 MG/GM VA CREA: TOPICAL_CREAM | VAGINAL | 12 refills | Status: DC

## 2019-02-17 MED ORDER — ESTRADIOL 0.1 MG/GM VA CREA: TOPICAL_CREAM | VAGINAL | 12 refills | Status: DC

## 2019-02-17 NOTE — Patient Instructions (Signed)
I have given you two prescriptions for a vaginal estrogen cream.  Estrace and Premarin.  Please take these to your pharmacy and see which one your insurance covers.  If both are too expensive, please call the office at [phone] for an alternative.  You are given a sample of vaginal estrogen cream Premarin and instructed to apply 0.5mg (pea-sized amount)  just inside the vaginal introitus with a finger-tip on Monday, Wednesday and Friday nights,     

## 2019-02-18 ENCOUNTER — Telehealth: Payer: Self-pay | Admitting: Urology

## 2019-02-18 NOTE — Telephone Encounter (Signed)
Called and spoke with patient regarding new creams and PTNS. Patient verbalized understanding and will wait to hear about insurance coverage for PTNS.

## 2019-02-18 NOTE — Telephone Encounter (Signed)
Pt LMOM and states that she does not know why Nystatin was called in for her. She requests a call back please.

## 2019-02-18 NOTE — Telephone Encounter (Signed)
NO PA REQUIRED WITH [employer] 02-18-19 MICHELLE

## 2019-02-24 ENCOUNTER — Ambulatory Visit
Admission: RE | Admit: 2019-02-24 | Discharge: 2019-02-24 | Disposition: A | Discharge status: Home / Self Care | Payer: Medicare Other | Source: Ambulatory Visit | Attending: Urology | Admitting: Urology

## 2019-02-24 ENCOUNTER — Other Ambulatory Visit: Payer: Self-pay

## 2019-02-24 DIAGNOSIS — R310 Gross hematuria: Secondary | ICD-10-CM | POA: Diagnosis present

## 2019-03-11 ENCOUNTER — Ambulatory Visit: Payer: Medicare Other

## 2019-03-18 ENCOUNTER — Ambulatory Visit: Payer: Medicare Other

## 2019-03-25 ENCOUNTER — Ambulatory Visit: Payer: Medicare Other

## 2019-04-01 ENCOUNTER — Ambulatory Visit: Payer: Medicare Other

## 2019-04-08 ENCOUNTER — Ambulatory Visit: Payer: Medicare Other

## 2019-04-12 ENCOUNTER — Other Ambulatory Visit: Payer: Self-pay

## 2019-04-12 ENCOUNTER — Encounter: Payer: Self-pay | Admitting: Podiatry

## 2019-04-12 ENCOUNTER — Ambulatory Visit (INDEPENDENT_AMBULATORY_CARE_PROVIDER_SITE_OTHER): Payer: Medicare Other | Admitting: Podiatry

## 2019-04-12 VITALS — Temp 97.5°F

## 2019-04-12 DIAGNOSIS — B351 Tinea unguium: Secondary | ICD-10-CM | POA: Diagnosis not present

## 2019-04-12 DIAGNOSIS — E119 Type 2 diabetes mellitus without complications: Secondary | ICD-10-CM | POA: Diagnosis not present

## 2019-04-12 DIAGNOSIS — M79676 Pain in unspecified toe(s): Secondary | ICD-10-CM | POA: Diagnosis not present

## 2019-04-12 DIAGNOSIS — M79609 Pain in unspecified limb: Principal | ICD-10-CM

## 2019-04-12 NOTE — Progress Notes (Signed)
Complaint:  Visit Type: Patient returns to my office for continued preventative foot care services. Complaint: Patient states" my nails have grown long and thick and become painful to walk and wear shoes" Patient has been diagnosed with DM with no foot complications. The patient presents for preventative foot care services. No changes to ROS  Podiatric Exam: Vascular: dorsalis pedis and posterior tibial pulses are palpable bilateral. Capillary return is immediate. Temperature gradient is WNL. Skin turgor WNL  Sensorium: Normal Semmes Weinstein monofilament test. Normal tactile sensation bilaterally. Nail Exam: Pt has thick disfigured discolored nails with subungual debris noted bilateral entire nail hallux through fifth toenails Ulcer Exam: There is no evidence of ulcer or pre-ulcerative changes or infection. Orthopedic Exam: Muscle tone and strength are WNL. No limitations in general ROM. No crepitus or effusions noted. HAV  B/L. Skin: No Porokeratosis. No infection or ulcers  Diagnosis:  Onychomycosis, , Pain in right toe, pain in left toes  Treatment & Plan Procedures and Treatment: Consent by patient was obtained for treatment procedures. The patient understood the discussion of treatment and procedures well. All questions were answered thoroughly reviewed. Debridement of mycotic and hypertrophic toenails, 1 through 5 bilateral and clearing of subungual debris. No ulceration, no infection noted.  Return Visit-Office Procedure: Patient instructed to return to the office for a follow up visit 3 months for continued evaluation and treatment.    Egor Fullilove DPM 

## 2019-04-15 ENCOUNTER — Ambulatory Visit: Payer: Medicare Other

## 2019-04-22 ENCOUNTER — Ambulatory Visit: Payer: Medicare Other

## 2019-04-29 ENCOUNTER — Ambulatory Visit: Payer: Medicare Other

## 2019-05-06 ENCOUNTER — Ambulatory Visit: Payer: Medicare Other

## 2019-05-13 ENCOUNTER — Ambulatory Visit: Payer: Medicare Other

## 2019-05-20 ENCOUNTER — Ambulatory Visit: Payer: Medicare Other

## 2019-05-21 ENCOUNTER — Ambulatory Visit (INDEPENDENT_AMBULATORY_CARE_PROVIDER_SITE_OTHER): Payer: Medicare Other

## 2019-05-21 ENCOUNTER — Other Ambulatory Visit: Payer: Self-pay

## 2019-05-21 DIAGNOSIS — N3941 Urge incontinence: Secondary | ICD-10-CM | POA: Diagnosis not present

## 2019-05-21 NOTE — Progress Notes (Signed)
PTNS  Session # 1  Health & Social Factors: no change Caffeine: 6 Alcohol: 0 Daytime voids #per day: 8 Night-time voids #per night: 1 Urgency: strong Incontinence Episodes #per day: 0-2 Ankle used: lef Treatment Setting: 2 Feeling/ Response: both Comments: patient was encouraged to limit caffeine intake if possible   Preformed By: Fonnie Jarvis, CMA   Follow Up: 1 week  '

## 2019-05-27 ENCOUNTER — Ambulatory Visit: Payer: Medicare Other

## 2019-05-28 ENCOUNTER — Other Ambulatory Visit: Payer: Self-pay

## 2019-05-28 ENCOUNTER — Ambulatory Visit: Payer: Medicare Other | Admitting: Family Medicine

## 2019-05-28 DIAGNOSIS — N3941 Urge incontinence: Secondary | ICD-10-CM

## 2019-05-28 NOTE — Progress Notes (Signed)
PTNS  Session # 2  Health & Social Factors: no change Caffeine: 2 Alcohol: 0 Daytime voids #per day: 7 Night-time voids #per night: 1 Urgency: mild Incontinence Episodes #per day: 2 Ankle used: left Treatment Setting: 6 Feeling/ Response: sensory Comments: Patient tolerated well  Preformed By: Elberta Leatherwood, CMA  Follow Up: 1 week #3

## 2019-06-03 ENCOUNTER — Ambulatory Visit: Payer: Medicare Other

## 2019-06-04 ENCOUNTER — Encounter: Payer: Self-pay | Admitting: Family Medicine

## 2019-06-04 ENCOUNTER — Other Ambulatory Visit: Payer: Self-pay

## 2019-06-04 ENCOUNTER — Ambulatory Visit (INDEPENDENT_AMBULATORY_CARE_PROVIDER_SITE_OTHER): Payer: Medicare Other | Admitting: Family Medicine

## 2019-06-04 DIAGNOSIS — N3941 Urge incontinence: Secondary | ICD-10-CM | POA: Diagnosis not present

## 2019-06-04 NOTE — Progress Notes (Signed)
PTNS  Session #3   Health & Social Factors: no change Caffeine: 1 Alcohol: 0 Daytime voids #per day: 6 Night-time voids #per night: 1 Urgency: mild Incontinence Episodes #per day: 1 Ankle used: left Treatment Setting: 4 Feeling/ Response: both Comments: patient tolerated well  Preformed By: Elberta Leatherwood, CMA  Follow Up: 1 week

## 2019-06-10 ENCOUNTER — Ambulatory Visit: Payer: Medicare Other

## 2019-06-11 ENCOUNTER — Other Ambulatory Visit: Payer: Self-pay

## 2019-06-11 ENCOUNTER — Ambulatory Visit: Payer: Medicare Other

## 2019-06-11 ENCOUNTER — Ambulatory Visit (INDEPENDENT_AMBULATORY_CARE_PROVIDER_SITE_OTHER): Payer: Medicare Other

## 2019-06-11 DIAGNOSIS — N393 Stress incontinence (female) (male): Secondary | ICD-10-CM

## 2019-06-11 NOTE — Progress Notes (Signed)
PTNS  Session # 4  Health & Social Factors: no change  Caffeine: 1 Alcohol: 0 Daytime voids #per day: 5 Night-time voids #per night: 1 Urgency: mild Incontinence Episodes #per day: 1 Ankle used: left Treatment Setting: 5 Feeling/ Response: sensory Comments: patient tolerated well  Preformed By: Shawnie Dapper, CMA  Follow Up:  As scheduled

## 2019-06-17 ENCOUNTER — Ambulatory Visit: Payer: Medicare Other

## 2019-06-17 ENCOUNTER — Other Ambulatory Visit: Payer: Self-pay

## 2019-06-17 ENCOUNTER — Ambulatory Visit (INDEPENDENT_AMBULATORY_CARE_PROVIDER_SITE_OTHER): Payer: Medicare Other

## 2019-06-17 DIAGNOSIS — N393 Stress incontinence (female) (male): Secondary | ICD-10-CM | POA: Diagnosis not present

## 2019-06-17 NOTE — Progress Notes (Signed)
PTNS  Session # 5  Health & Social Factors: Patient is down to 1 Diet Coke per day from 16 Caffeine: 1 Alcohol: 0 Daytime voids #per day: 4 Night-time voids #per night: 1 Urgency: 0 Incontinence Episodes #per day: 1 Ankle used: Left Treatment Setting: 4 Feeling/ Response: Toe Flex and Sensory Comments: None   Preformed By: Gordy Clement, CMA   Follow Up: As Scheduled

## 2019-06-24 ENCOUNTER — Ambulatory Visit: Payer: Medicare Other

## 2019-06-25 ENCOUNTER — Ambulatory Visit (INDEPENDENT_AMBULATORY_CARE_PROVIDER_SITE_OTHER): Payer: Medicare Other

## 2019-06-25 ENCOUNTER — Other Ambulatory Visit: Payer: Self-pay

## 2019-06-25 DIAGNOSIS — N393 Stress incontinence (female) (male): Secondary | ICD-10-CM | POA: Diagnosis not present

## 2019-06-25 NOTE — Progress Notes (Signed)
PTNS  Session # 6  Health & Social Factors: no change Caffeine: 1 Alcohol: 0 Daytime voids #per day: 5 Night-time voids #per night: 1 Urgency: mild Incontinence Episodes #per day: 1 Ankle used: left Treatment Setting: 3 Feeling/ Response: sensory Comments: Patient stated she felt a different sensation today than during previous treatments.  Stated that she felt a tingling pulsating up her leg but was ok. Patient tolerated well.  Preformed By: Shawnie Dapper, CMA  Follow Up: 1 week

## 2019-07-02 ENCOUNTER — Ambulatory Visit: Payer: Medicare Other

## 2019-07-02 ENCOUNTER — Ambulatory Visit (INDEPENDENT_AMBULATORY_CARE_PROVIDER_SITE_OTHER): Payer: Medicare Other

## 2019-07-02 ENCOUNTER — Other Ambulatory Visit: Payer: Self-pay

## 2019-07-02 DIAGNOSIS — N393 Stress incontinence (female) (male): Secondary | ICD-10-CM

## 2019-07-02 NOTE — Progress Notes (Signed)
PTNS  Session #7  Health & Social Factors: No Change Caffeine: 1 Alcohol: None Daytime voids #per day: 5-6 Night-time voids #per night: 1 Urgency: Mild Incontinence Episodes #per day: 1 Ankle used: Left Treatment Setting: 5 starting, 4 mid session  Feeling/ Response: Toe flex & Sensory Comments: N/A  Preformed By: Gordy Clement, CMA  Follow Up: As Scheduled

## 2019-07-09 ENCOUNTER — Ambulatory Visit (INDEPENDENT_AMBULATORY_CARE_PROVIDER_SITE_OTHER): Payer: Medicare Other | Admitting: *Deleted

## 2019-07-09 ENCOUNTER — Other Ambulatory Visit: Payer: Self-pay

## 2019-07-09 DIAGNOSIS — N3941 Urge incontinence: Secondary | ICD-10-CM

## 2019-07-09 DIAGNOSIS — N393 Stress incontinence (female) (male): Secondary | ICD-10-CM | POA: Diagnosis not present

## 2019-07-09 NOTE — Progress Notes (Signed)
PTNS  Session # 8  Health & Social Factors: No change Caffeine: 1 Alcohol: None Daytime voids #per day: 5-6 Night-time voids #per night: 1 Urgency: Mild Incontinence Episodes #per day: 0 Ankle used: Left Treatment Setting: 2 starting, 1 for the session Feeling/ Response: Toe flex and sensory   Preformed By: Verlene Mayer, CMA   Follow Up: As scheduled

## 2019-07-12 ENCOUNTER — Encounter: Payer: Self-pay | Admitting: Podiatry

## 2019-07-12 ENCOUNTER — Other Ambulatory Visit: Payer: Self-pay

## 2019-07-12 ENCOUNTER — Ambulatory Visit (INDEPENDENT_AMBULATORY_CARE_PROVIDER_SITE_OTHER): Payer: Medicare Other | Admitting: Podiatry

## 2019-07-12 VITALS — Temp 98.1°F

## 2019-07-12 DIAGNOSIS — E119 Type 2 diabetes mellitus without complications: Secondary | ICD-10-CM

## 2019-07-12 DIAGNOSIS — M79676 Pain in unspecified toe(s): Secondary | ICD-10-CM

## 2019-07-12 DIAGNOSIS — B351 Tinea unguium: Secondary | ICD-10-CM

## 2019-07-12 NOTE — Progress Notes (Signed)
Complaint:  Visit Type: Patient returns to my office for continued preventative foot care services. Complaint: Patient states" my nails have grown long and thick and become painful to walk and wear shoes" Patient has been diagnosed with DM with no foot complications. The patient presents for preventative foot care services. No changes to ROS  Podiatric Exam: Vascular: dorsalis pedis and posterior tibial pulses are palpable bilateral. Capillary return is immediate. Temperature gradient is WNL. Skin turgor WNL  Sensorium: Normal Semmes Weinstein monofilament test. Normal tactile sensation bilaterally. Nail Exam: Pt has thick disfigured discolored nails with subungual debris noted bilateral entire nail hallux through fifth toenails Ulcer Exam: There is no evidence of ulcer or pre-ulcerative changes or infection. Orthopedic Exam: Muscle tone and strength are WNL. No limitations in general ROM. No crepitus or effusions noted. HAV  B/L. Skin: No Porokeratosis. No infection or ulcers  Diagnosis:  Onychomycosis, , Pain in right toe, pain in left toes  Treatment & Plan Procedures and Treatment: Consent by patient was obtained for treatment procedures. The patient understood the discussion of treatment and procedures well. All questions were answered thoroughly reviewed. Debridement of mycotic and hypertrophic toenails, 1 through 5 bilateral and clearing of subungual debris. No ulceration, no infection noted.  Return Visit-Office Procedure: Patient instructed to return to the office for a follow up visit 3 months for continued evaluation and treatment.    Coben Godshall DPM 

## 2019-07-16 ENCOUNTER — Ambulatory Visit (INDEPENDENT_AMBULATORY_CARE_PROVIDER_SITE_OTHER): Payer: Medicare Other | Admitting: Family Medicine

## 2019-07-16 ENCOUNTER — Other Ambulatory Visit: Payer: Self-pay

## 2019-07-16 DIAGNOSIS — N3941 Urge incontinence: Secondary | ICD-10-CM | POA: Diagnosis not present

## 2019-07-16 NOTE — Progress Notes (Signed)
PTNS  Session # 9  Health & Social Factors: no change Caffeine: 1 Alcohol: 0 Daytime voids #per day: 6 Night-time voids #per night: 1 Urgency: mild Incontinence Episodes #per day: 0 Ankle used: left Treatment Setting: 3 Feeling/ Response: Sensory Comments: Patient tolerated well  Preformed By: Elberta Leatherwood, CMA  Follow Up: 1 week #10

## 2019-07-23 ENCOUNTER — Other Ambulatory Visit: Payer: Self-pay

## 2019-07-23 ENCOUNTER — Ambulatory Visit (INDEPENDENT_AMBULATORY_CARE_PROVIDER_SITE_OTHER): Payer: Medicare Other

## 2019-07-23 DIAGNOSIS — N393 Stress incontinence (female) (male): Secondary | ICD-10-CM

## 2019-07-23 DIAGNOSIS — N3941 Urge incontinence: Secondary | ICD-10-CM | POA: Diagnosis not present

## 2019-07-23 NOTE — Progress Notes (Signed)
PTNS  Session # 10  Health & Social Factors: No Change Caffeine: 2 Alcohol: 0 Daytime voids #per day: 5-6 Night-time voids #per night: 1 Urgency: Mild Incontinence Episodes #per day: 1 Ankle used: Left Treatment Setting: 4 Feeling/ Response: Sensory Comments: N/A  Preformed By: Gordy Clement, CMA   Follow Up: RTC as scheduled

## 2019-07-30 ENCOUNTER — Ambulatory Visit (INDEPENDENT_AMBULATORY_CARE_PROVIDER_SITE_OTHER): Payer: Medicare Other

## 2019-07-30 ENCOUNTER — Other Ambulatory Visit: Payer: Self-pay

## 2019-07-30 DIAGNOSIS — N3941 Urge incontinence: Secondary | ICD-10-CM | POA: Diagnosis not present

## 2019-07-30 NOTE — Progress Notes (Signed)
PTNS  Session # 11  Health & Social Factors: no change Caffeine: 2 Alcohol: 0 Daytime voids #per day: 5-6 Night-time voids #per night: 2 Urgency: mild Incontinence Episodes #per day: 0-1 Ankle used: left Treatment Setting: 5 Feeling/ Response: both Comments: none  Preformed By: Fonnie Jarvis, CMA   Follow Up: 1 wk

## 2019-08-02 ENCOUNTER — Other Ambulatory Visit: Payer: Self-pay | Admitting: Orthopedic Surgery

## 2019-08-02 DIAGNOSIS — S76011A Strain of muscle, fascia and tendon of right hip, initial encounter: Secondary | ICD-10-CM

## 2019-08-06 ENCOUNTER — Other Ambulatory Visit: Payer: Self-pay

## 2019-08-06 ENCOUNTER — Ambulatory Visit (INDEPENDENT_AMBULATORY_CARE_PROVIDER_SITE_OTHER): Payer: Medicare Other | Admitting: *Deleted

## 2019-08-06 DIAGNOSIS — N3941 Urge incontinence: Secondary | ICD-10-CM | POA: Diagnosis not present

## 2019-08-06 NOTE — Progress Notes (Signed)
PTNS  Session # 12  Health & Social Factors: No change Caffeine: 2 Alcohol: 0 Daytime voids #per day: 5-6 Night-time voids #per night: 1 Urgency: Mild Incontinence Episodes #per day: 1 Ankle used: Left Treatment Setting: 3 Feeling/ Response: Both Comments: None  Preformed By: Verlene Mayer, CMA  Assistant: Verlene Mayer, [place]

## 2019-08-12 ENCOUNTER — Other Ambulatory Visit: Payer: Self-pay

## 2019-08-12 ENCOUNTER — Ambulatory Visit
Admission: RE | Admit: 2019-08-12 | Discharge: 2019-08-12 | Disposition: A | Discharge status: Home / Self Care | Payer: Medicare Other | Source: Ambulatory Visit | Attending: Orthopedic Surgery | Admitting: Orthopedic Surgery

## 2019-08-12 DIAGNOSIS — S76011A Strain of muscle, fascia and tendon of right hip, initial encounter: Secondary | ICD-10-CM | POA: Insufficient documentation

## 2019-08-12 NOTE — Progress Notes (Signed)
08/13/2019 10:36 AM   [name] May 08, 1951 [id number]  Referring provider: [name], MD [facility] [place],  [place] [address]  Chief Complaint  Patient presents with   Follow-up    HPI: Crystal Foley is a 68 y.o. female with a history of nephrolithiasis, vaginal atrophy and incontinence who presents today for an annual follow up.    History of nephrolithiasis Patient has a history of stones.  Patient was seen at [facility] ED on 03/12/2015 for a 9 mm left UPJ stone with intractable pain and vomiting.  Dr. Jeffie Pollock placed a left ureteral stent at that time and she followed up at [facility] for definitive treatment of the stone.  Dr. Jeffie Pollock performed a left ureteroscopic stone extraction and cystoscopy with removal of left double-J stent on 03/17/2015.  RUS on 06/14/2015 noted no hydronephrosis and no stones.  24 hour urine results indicate a low urinary volume and increased oxalates.  A CT renal stone study on 12/21/2018 reported the impression of:  1. Mild left hydroureteronephrosis. There is a 4 mm stone in the dependent bladder, likely recently passed stone. No additional ureteral or obstructing calculi.  2. Nonobstructing stones in both kidneys. Parapelvic cysts in both Kidneys.  3. Minimal distal colonic diverticulosis without diverticulitis.  4. Aortic Atherosclerosis (ICD10-I70.0).  RUS in 02/2019 large bilateral parapelvic renal cysts without definite hydronephrosis.  No worrisome renal lesions. No definite renal calculi by ultrasound.  Normal bladder.  Bilateral ureteral jets are noted.     Vaginal atrophy She is not using the vaginal estrogen cream at this time.    Incontinence The patient is  experiencing urgency x 0-3 (stable), frequency x 4-7 (stable), is restricting fluids to avoid visits to the restroom, not engaging in toilet mapping, incontinence x 0-3 (improved) and nocturia x 0-3 (stable).   Her BP is 137/86.   Her PVR is 0 mL.   She has been very pleased with the PTNS therapy.  She has gone from 12 pads daily to one panty liner daily.   She has also "moved out of the bathroom" and is down to drinking two diet Colas daily vs the 12 to 18 in the past.  Patient denies any gross hematuria, dysuria or suprapubic/flank pain.  Patient denies any fevers, chills, nausea or vomiting.   Her UA today is negative.   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[place])    Hematuria, gross    Hydronephrosis    Hypercholesterolemia    Hypertension    Hypothyroidism    Nephrolithiasis    Nephrolithiasis    Seizures ([place])    last seizure 7-8 yrs ago   Shortness of breath dyspnea    with exertion    Surgical History: Past Surgical History:  Procedure Laterality Date   Bladder tack     COLONOSCOPY     COLONOSCOPY WITH PROPOFOL N/A 03/25/2018   Procedure: COLONOSCOPY WITH PROPOFOL;  Surgeon: Toledo, Benay Pike, MD;  Location: [facility] ENDOSCOPY;  Service: Gastroenterology;  Laterality: N/A;   COLONOSCOPY WITH PROPOFOL N/A 05/13/2018   Procedure: COLONOSCOPY WITH PROPOFOL;  Surgeon: Toledo, Benay Pike, MD;  Location: [facility] ENDOSCOPY;  Service: Gastroenterology;  Laterality: N/A;   CYSTOSCOPY W/ URETERAL STENT PLACEMENT  03/11/15   CYSTOSCOPY WITH RETROGRADE PYELOGRAM, URETEROSCOPY AND STENT PLACEMENT Left 03/17/2015   Procedure: CYSTOSCOPY, STENT REMOVAL, LEFT URETEROSCOPY  WITH STONE REMOVAL WITH BASKET;  Surgeon: Irine Seal, MD;  Location: [facility];  Service: Urology;  Laterality: Left;   SHOULDER ARTHROSCOPY DISTAL CLAVICLE EXCISION AND OPEN ROTATOR CUFF REPAIR     TUBAL LIGATION      Home Medications:  Allergies as of 08/13/2019      Reactions   Dilantin [phenytoin Sodium Extended] Swelling    Phenytoin Swelling      Medication List       Accurate as of August 13, 2019 10:36 AM. If you have any questions, ask your nurse or doctor.        albuterol 108 (90 Base) MCG/ACT inhaler Commonly known as: VENTOLIN HFA Inhale 1 puff into the lungs every 6 (six) hours as needed for wheezing or shortness of breath.   atorvastatin 40 MG tablet Commonly known as: LIPITOR Take 40 mg by mouth daily.   canagliflozin 300 MG Tabs tablet Commonly known as: INVOKANA Take by mouth.   clotrimazole-betamethasone cream Commonly known as: LOTRISONE Apply 1 application topically 2 (two) times daily.   conjugated estrogens vaginal cream Commonly known as: Premarin Apply 0.5mg  (pea-sized amount)  just inside the vaginal introitus with a finger-tip on  Monday, Wednesday and Friday nights.   diclofenac sodium 1 % Gel Commonly known as: VOLTAREN Apply 4 g topically 4 (four) times daily.   divalproex 250 MG 24 hr tablet Commonly known as: DEPAKOTE ER Take 500-750 mg by mouth 2 (two) times daily. Take 500mg  in morning and take 750mg  at night   empagliflozin 25 MG Tabs tablet Commonly known as: JARDIANCE Take by mouth.   estradiol 0.1 MG/GM vaginal cream Commonly known as: ESTRACE VAGINAL Apply 0.5mg  (pea-sized amount)  just inside the vaginal introitus with a finger-tip on Monday, Wednesday and Friday nights.   fluticasone 50 MCG/ACT nasal spray Commonly known as: FLONASE Place 2 sprays into both nostrils daily as needed for allergies or rhinitis.   Fluticasone-Salmeterol 250-50 MCG/DOSE Aepb Commonly known as: ADVAIR Inhale 1 puff into the lungs 2 (two) times daily.   glimepiride 2 MG tablet Commonly known as: AMARYL Take 2 mg by mouth daily with breakfast.   losartan 100 MG tablet Commonly known as: COZAAR Take 50 mg by mouth every morning.   metFORMIN 500 MG 24 hr tablet Commonly known as: GLUCOPHAGE-XR Take 2,000 mg by mouth daily with supper.   mirabegron ER 50 MG Tb24  tablet Commonly known as: MYRBETRIQ Take 1 tablet (50 mg total) by mouth daily.   nystatin cream Commonly known as: MYCOSTATIN Apply 1 application topically 2 (two) times daily.   ondansetron 4 MG disintegrating tablet Commonly known as: Zofran ODT Take 1 tablet (4 mg total) by mouth every 8 (eight) hours as needed for nausea or vomiting.   oxyCODONE-acetaminophen 5-325 MG tablet Commonly known as: PERCOCET/ROXICET Take 1 tablet by mouth every 4 (four) hours as needed for severe pain.       Allergies:  Allergies  Allergen Reactions   Dilantin [Phenytoin Sodium Extended] Swelling   Phenytoin Swelling    Family History: Family History  Problem Relation Age of Onset   Coronary artery disease Sister    Heart disease Sister    Brain cancer Father    Emphysema Mother     Social History:  reports that she has never smoked. She has never used smokeless tobacco. She reports that  she does not drink alcohol or use drugs.  ROS: Urological Symptom Review UROLOGY Frequent Urination?: No Hard to postpone urination?: No Burning/pain with urination?: No Get up at night to urinate?: No Leakage of urine?: No Urine stream starts and stops?: No Trouble starting stream?: No Do you have to strain to urinate?: No Blood in urine?: No Urinary tract infection?: No Sexually transmitted disease?: No Injury to kidneys or bladder?: No Painful intercourse?: No Weak stream?: No Currently pregnant?: No Vaginal bleeding?: No Last menstrual period?: n Gastrointestinal Nausea?: No Vomiting?: No Indigestion/heartburn?: No Diarrhea?: No Constipation?: No Constitutional Fever: No Night sweats?: No Weight loss?: No Fatigue?: No Skin Skin rash/lesions?: No Itching?: No Eyes Blurred vision?: No Double vision?: No Ears/Nose/Throat Sore throat?: No Sinus problems?: No Hematologic/Lymphatic Swollen glands?: No Easy bruising?: No Cardiovascular Leg swelling?: No Chest pain?:  No Respiratory Cough?: No Shortness of breath?: No Endocrine Excessive thirst?: No Musculoskeletal Back pain?: No Joint pain?: No Neurological Headaches?: No Dizziness?: No Psychologic Depression?: No Anxiety?: No  Physical Exam: BP 137/86    Pulse (!) 103    Wt 184 lb (83.5 kg)    BMI 31.58 kg/m   Constitutional:  Well nourished. Alert and oriented, No acute distress. HEENT: [place] AT, moist mucus membranes.  Trachea midline, no masses. Cardiovascular: No clubbing, cyanosis, or edema. Respiratory: Normal respiratory effort, no increased work of breathing. Neurologic: Grossly intact, no focal deficits, moving all 4 extremities. Psychiatric: Normal mood and affect.  Laboratory Data: Lab Results  Component Value Date   WBC 5.4 12/24/2018   HGB 11.7 (L) 12/24/2018   HCT 36.8 12/24/2018   MCV 94.1 12/24/2018   PLT 154 12/24/2018    Lab Results  Component Value Date   CREATININE 0.47 12/24/2018   Lab Results  Component Value Date   AST 19 12/23/2018   Lab Results  Component Value Date   ALT 14 12/23/2018   Urinalysis Negative.  See Epic  I have reviewed the labs.  Pertinent Imaging: Results for NYELA, SLUYTER (MRN [id number]) as of 08/13/2019 10:34  Ref. Range 08/13/2019 10:15  Scan Result Unknown 0     Assessment & Plan:    1. History of hematuria Associated with stone - UA is clear today She is to report any gross hematuria  2. Urge incontinence Very pleased with PTNS results Continue month maintenance PTNS   3. Vaginal atrophy Not using creams  Return for first thursdays for montly PTNS .  [name], PA-C  [facility] [address], [place], [place] [address] [phone]

## 2019-08-13 ENCOUNTER — Encounter: Payer: Self-pay | Admitting: Urology

## 2019-08-13 ENCOUNTER — Ambulatory Visit (INDEPENDENT_AMBULATORY_CARE_PROVIDER_SITE_OTHER): Payer: Medicare Other | Admitting: Urology

## 2019-08-13 VITALS — BP 137/86 | HR 103 | Wt 184.0 lb

## 2019-08-13 DIAGNOSIS — Z87448 Personal history of other diseases of urinary system: Secondary | ICD-10-CM

## 2019-08-13 DIAGNOSIS — N952 Postmenopausal atrophic vaginitis: Secondary | ICD-10-CM | POA: Diagnosis not present

## 2019-08-13 DIAGNOSIS — N3941 Urge incontinence: Secondary | ICD-10-CM | POA: Diagnosis not present

## 2019-08-13 LAB — URINALYSIS, COMPLETE
Bilirubin, UA: NEGATIVE
Leukocytes,UA: NEGATIVE
Nitrite, UA: NEGATIVE
Protein,UA: NEGATIVE
Specific Gravity, UA: 1.01 (ref 1.005–1.030)
Urobilinogen, Ur: 0.2 mg/dL (ref 0.2–1.0)
pH, UA: 5.5 (ref 5.0–7.5)

## 2019-08-13 LAB — MICROSCOPIC EXAMINATION

## 2019-08-13 LAB — BLADDER SCAN AMB NON-IMAGING: Scan Result: 0

## 2019-09-16 ENCOUNTER — Other Ambulatory Visit: Payer: Self-pay

## 2019-09-16 ENCOUNTER — Ambulatory Visit (INDEPENDENT_AMBULATORY_CARE_PROVIDER_SITE_OTHER): Payer: Medicare Other

## 2019-09-16 DIAGNOSIS — N3941 Urge incontinence: Secondary | ICD-10-CM

## 2019-09-16 NOTE — Progress Notes (Signed)
PTNS  Session # Maintenance     Health & Social Factors: Pt has been able to control bladder much better, she has reduced the size and number of pads she uses Caffeine: 3 Alcohol: 0 Daytime voids #per day: 5 Night-time voids #per night: 1 Urgency: Mild  Incontinence Episodes #per day: 0 Ankle used: Left Treatment Setting: 2 Feeling/ Response: Sensory  Comments: N/A  Preformed By: Gordy Clement, CMA (AAMA)  Follow Up: 1 month monthly PTNS

## 2019-09-16 NOTE — Addendum Note (Signed)
Addended by: Donalee Citrin on: 09/16/2019 09:36 AM   Modules accepted: Orders

## 2019-10-11 ENCOUNTER — Other Ambulatory Visit: Payer: Self-pay

## 2019-10-11 ENCOUNTER — Ambulatory Visit (INDEPENDENT_AMBULATORY_CARE_PROVIDER_SITE_OTHER): Payer: Medicare Other | Admitting: Podiatry

## 2019-10-11 ENCOUNTER — Encounter: Payer: Self-pay | Admitting: Podiatry

## 2019-10-11 DIAGNOSIS — B351 Tinea unguium: Secondary | ICD-10-CM | POA: Diagnosis not present

## 2019-10-11 DIAGNOSIS — E119 Type 2 diabetes mellitus without complications: Secondary | ICD-10-CM

## 2019-10-11 DIAGNOSIS — M79676 Pain in unspecified toe(s): Secondary | ICD-10-CM

## 2019-10-11 NOTE — Progress Notes (Signed)
Complaint:  Visit Type: Patient returns to my office for continued preventative foot care services. Complaint: Patient states" my nails have grown long and thick and become painful to walk and wear shoes" Patient has been diagnosed with DM with no foot complications. The patient presents for preventative foot care services. No changes to ROS  Podiatric Exam: Vascular: dorsalis pedis and posterior tibial pulses are palpable bilateral. Capillary return is immediate. Temperature gradient is WNL. Skin turgor WNL  Sensorium: Normal Semmes Weinstein monofilament test. Normal tactile sensation bilaterally. Nail Exam: Pt has thick disfigured discolored nails with subungual debris noted bilateral entire nail hallux through fifth toenails Ulcer Exam: There is no evidence of ulcer or pre-ulcerative changes or infection. Orthopedic Exam: Muscle tone and strength are WNL. No limitations in general ROM. No crepitus or effusions noted. HAV  B/L. Skin: No Porokeratosis. No infection or ulcers  Diagnosis:  Onychomycosis, , Pain in right toe, pain in left toes  Treatment & Plan Procedures and Treatment: Consent by patient was obtained for treatment procedures. The patient understood the discussion of treatment and procedures well. All questions were answered thoroughly reviewed. Debridement of mycotic and hypertrophic toenails, 1 through 5 bilateral and clearing of subungual debris. No ulceration, no infection noted.  Return Visit-Office Procedure: Patient instructed to return to the office for a follow up visit 3 months for continued evaluation and treatment.    Brayson Livesey DPM 

## 2019-10-20 NOTE — Progress Notes (Signed)
PTNS  Session # Monthly   Health & Social Factors: No change Caffeine: 2 Alcohol: 0 Daytime voids #per day: 12 Night-time voids #per night: 1 Urgency: mild Incontinence Episodes #per day: 0 Ankle used: left Treatment Setting: 2 Feeling/ Response: Both sensory and toe flex Comments: Patient tolerated well  Performed By: Rebeca A. Morris, CMA

## 2019-10-21 ENCOUNTER — Other Ambulatory Visit: Payer: Self-pay

## 2019-10-21 ENCOUNTER — Ambulatory Visit (INDEPENDENT_AMBULATORY_CARE_PROVIDER_SITE_OTHER): Payer: Medicare Other | Admitting: Urology

## 2019-10-21 ENCOUNTER — Encounter: Payer: Self-pay | Admitting: Urology

## 2019-10-21 VITALS — BP 144/85 | HR 125 | Ht 64.0 in | Wt 184.0 lb

## 2019-10-21 DIAGNOSIS — N3941 Urge incontinence: Secondary | ICD-10-CM | POA: Diagnosis not present

## 2019-10-21 MED ORDER — CLOTRIMAZOLE-BETAMETHASONE 1-0.05 % EX CREA: 1.0000 "application " | TOPICAL_CREAM | Freq: Two times a day (BID) | CUTANEOUS | 3 refills | Status: DC

## 2019-11-17 ENCOUNTER — Other Ambulatory Visit: Payer: Self-pay | Admitting: Internal Medicine

## 2019-11-17 DIAGNOSIS — Z1231 Encounter for screening mammogram for malignant neoplasm of breast: Secondary | ICD-10-CM

## 2019-11-18 ENCOUNTER — Other Ambulatory Visit: Payer: Self-pay

## 2019-11-18 ENCOUNTER — Ambulatory Visit (INDEPENDENT_AMBULATORY_CARE_PROVIDER_SITE_OTHER): Payer: Medicare Other | Admitting: *Deleted

## 2019-11-18 DIAGNOSIS — N3941 Urge incontinence: Secondary | ICD-10-CM

## 2019-11-18 NOTE — Progress Notes (Signed)
PTNS  Session # Maintenance     Health & Social Factors: Pt has been able to control bladder much better, she has reduced the size and number of pads she uses Caffeine: 3 Alcohol: 0 Daytime voids #per day: 5 Night-time voids #per night: 1 Urgency: Mild  Incontinence Episodes #per day: 0 Ankle used: Left Treatment Setting: 2 Feeling/ Response: Sensory  Comments: N/A  Preformed By: Gaspar Cola CMA (AAMA)  Follow Up: 1 month monthly PTNS

## 2019-12-17 ENCOUNTER — Emergency Department: Payer: Medicare Other

## 2019-12-17 ENCOUNTER — Emergency Department
Admission: EM | Admit: 2019-12-17 | Discharge: 2019-12-17 | Disposition: A | Discharge status: Home / Self Care | Payer: Medicare Other | Attending: Emergency Medicine | Admitting: Emergency Medicine

## 2019-12-17 ENCOUNTER — Other Ambulatory Visit: Payer: Self-pay

## 2019-12-17 DIAGNOSIS — Z7984 Long term (current) use of oral hypoglycemic drugs: Secondary | ICD-10-CM | POA: Diagnosis not present

## 2019-12-17 DIAGNOSIS — N200 Calculus of kidney: Secondary | ICD-10-CM | POA: Diagnosis not present

## 2019-12-17 DIAGNOSIS — I129 Hypertensive chronic kidney disease with stage 1 through stage 4 chronic kidney disease, or unspecified chronic kidney disease: Secondary | ICD-10-CM | POA: Insufficient documentation

## 2019-12-17 DIAGNOSIS — Z79899 Other long term (current) drug therapy: Secondary | ICD-10-CM | POA: Diagnosis not present

## 2019-12-17 DIAGNOSIS — R112 Nausea with vomiting, unspecified: Secondary | ICD-10-CM | POA: Insufficient documentation

## 2019-12-17 DIAGNOSIS — N182 Chronic kidney disease, stage 2 (mild): Secondary | ICD-10-CM | POA: Diagnosis not present

## 2019-12-17 DIAGNOSIS — E1122 Type 2 diabetes mellitus with diabetic chronic kidney disease: Secondary | ICD-10-CM | POA: Insufficient documentation

## 2019-12-17 DIAGNOSIS — E039 Hypothyroidism, unspecified: Secondary | ICD-10-CM | POA: Diagnosis not present

## 2019-12-17 DIAGNOSIS — J449 Chronic obstructive pulmonary disease, unspecified: Secondary | ICD-10-CM | POA: Diagnosis not present

## 2019-12-17 DIAGNOSIS — R109 Unspecified abdominal pain: Secondary | ICD-10-CM | POA: Diagnosis present

## 2019-12-17 LAB — CBC WITH DIFFERENTIAL/PLATELET
Abs Immature Granulocytes: 0.01 10*3/uL (ref 0.00–0.07)
Basophils Absolute: 0 10*3/uL (ref 0.0–0.1)
Basophils Relative: 1 %
Eosinophils Absolute: 0 10*3/uL (ref 0.0–0.5)
Eosinophils Relative: 1 %
HCT: 42.1 % (ref 36.0–46.0)
Hemoglobin: 14.5 g/dL (ref 12.0–15.0)
Immature Granulocytes: 0 %
Lymphocytes Relative: 30 %
Lymphs Abs: 1.7 10*3/uL (ref 0.7–4.0)
MCH: 30.3 pg (ref 26.0–34.0)
MCHC: 34.4 g/dL (ref 30.0–36.0)
MCV: 88.1 fL (ref 80.0–100.0)
Monocytes Absolute: 0.5 10*3/uL (ref 0.1–1.0)
Monocytes Relative: 9 %
Neutro Abs: 3.4 10*3/uL (ref 1.7–7.7)
Neutrophils Relative %: 59 %
Platelets: 191 10*3/uL (ref 150–400)
RBC: 4.78 MIL/uL (ref 3.87–5.11)
RDW: 12.8 % (ref 11.5–15.5)
WBC: 5.7 10*3/uL (ref 4.0–10.5)
nRBC: 0 % (ref 0.0–0.2)

## 2019-12-17 LAB — URINALYSIS, COMPLETE (UACMP) WITH MICROSCOPIC
Bilirubin Urine: NEGATIVE
Glucose, UA: >=500 mg/dL — AB
Ketones, ur: 5 mg/dL — AB
Nitrite: NEGATIVE
Protein, ur: 100 mg/dL — AB
RBC / HPF: >50 RBC/hpf — ABNORMAL HIGH (ref 0–5)
Specific Gravity, Urine: 1.029 (ref 1.005–1.030)
pH: 7 (ref 5.0–8.0)

## 2019-12-17 LAB — COMPREHENSIVE METABOLIC PANEL
ALT: 19 U/L (ref 0–44)
AST: 24 U/L (ref 15–41)
Albumin: 3.6 g/dL (ref 3.5–5.0)
Alkaline Phosphatase: 63 U/L (ref 38–126)
Anion gap: 12 (ref 5–15)
BUN: 11 mg/dL (ref 8–23)
CO2: 23 mmol/L (ref 22–32)
Calcium: 8.7 mg/dL — ABNORMAL LOW (ref 8.9–10.3)
Chloride: 102 mmol/L (ref 98–111)
Creatinine, Ser: 0.75 mg/dL (ref 0.44–1.00)
GFR calc Af Amer: >60 mL/min (ref >60)
GFR calc non Af Amer: >60 mL/min (ref >60)
Glucose, Bld: 378 mg/dL — ABNORMAL HIGH (ref 70–99)
Potassium: 3.9 mmol/L (ref 3.5–5.1)
Sodium: 137 mmol/L (ref 135–145)
Total Bilirubin: 0.7 mg/dL (ref 0.3–1.2)
Total Protein: 7 g/dL (ref 6.5–8.1)

## 2019-12-17 MED ORDER — TAMSULOSIN HCL 0.4 MG PO CAPS: 0.4000 mg | ORAL_CAPSULE | Freq: Every day | ORAL | 0 refills | Status: DC

## 2019-12-17 MED ORDER — MORPHINE SULFATE (PF) 4 MG/ML IV SOLN
4.0000 mg | Freq: Once | INTRAVENOUS | Status: AC
  Administered 2019-12-17: 4 mg via INTRAVENOUS

## 2019-12-17 MED ORDER — MORPHINE SULFATE (PF) 4 MG/ML IV SOLN
INTRAVENOUS | Status: AC
  Filled 2019-12-17: qty 1

## 2019-12-17 MED ORDER — KETOROLAC TROMETHAMINE 30 MG/ML IJ SOLN
15.0000 mg | Freq: Once | INTRAMUSCULAR | Status: AC
  Administered 2019-12-17: 15 mg via INTRAVENOUS
  Filled 2019-12-17: qty 1

## 2019-12-17 MED ORDER — ONDANSETRON HCL 4 MG/2ML IJ SOLN
4.0000 mg | Freq: Once | INTRAMUSCULAR | Status: AC
  Administered 2019-12-17: 4 mg via INTRAVENOUS
  Filled 2019-12-17: qty 2

## 2019-12-17 MED ORDER — SODIUM CHLORIDE 0.9 % IV SOLN
1000.0000 mL | Freq: Once | INTRAVENOUS | Status: AC
  Administered 2019-12-17: 1000 mL via INTRAVENOUS

## 2019-12-17 NOTE — ED Provider Notes (Signed)
[facility] Emergency Department Provider Note   ____________________________________________    I have reviewed the triage vital signs and the nursing notes.   HISTORY  Chief Complaint Flank Pain     HPI Crystal Foley is a 69 y.o. female who presents with complaints of right flank pain which is sharp in intensity.  She noted some discomfort there yesterday as well as some hematuria but the pain worsened significantly when she woke up today.  She reports this feels like prior kidney stones.  Has not take anything for this besides some Tylenol.  Has had nausea and vomiting.  No dysuria or fevers.  Past Medical History:  Diagnosis Date  . Asthma   . Bilateral kidney stones   . COPD (chronic obstructive pulmonary disease) ([place])   . Diabetes mellitus   . Generalized convulsive epilepsy ([place])   . 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 Hematuria, gross   . Hydronephrosis   . Hypercholesterolemia   . Hypertension   . Hypothyroidism   . Nephrolithiasis   . Nephrolithiasis   . Seizures ([place])    last seizure 7-8 yrs ago  . Shortness of breath dyspnea    with exertion    Patient Active Problem List   Diagnosis Date Noted  . Aortic atherosclerosis ([place]) 12/29/2018  . Community acquired pneumonia 12/24/2018  . Sepsis ([place]) 12/23/2018  . Health care maintenance 09/19/2015  . Calculus of kidney 06/01/2015  . Hypercholesterolemia without hypertriglyceridemia 09/27/2014  . Pure hypercholesterolemia 09/27/2014  . Adult BMI 30+ 09/25/2014  . Morbid obesity ([place]) 09/25/2014  . Severe obesity (BMI 35.0-35.9 with comorbidity) ([place]) 09/25/2014  . Asthma 05/15/2014  . Benign hypertension 05/15/2014  . HLD (hyperlipidemia) 05/15/2014  . Seizure ([place]) 05/15/2014  . Diabetes mellitus, type 2 ([place]) 05/15/2014  . 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 Bladder tack    . COLONOSCOPY    . COLONOSCOPY WITH PROPOFOL N/A 03/25/2018   Procedure: COLONOSCOPY WITH PROPOFOL;  Surgeon: Toledo, Benay Pike, MD;  Location: [facility] END[facility];  Service: Gastroenterology;  Laterality: N/A;  . COLONOSCOPY WITH PROPOFOL N/A 05/13/2018   Procedure: COLONOSCOPY WITH PROPOFOL;  Surgeon: Toledo, Benay Pike, MD;  Location: [facility] END[facility];  Service: Gastroenterology;  Laterality: N/A;  . CYSTOSCOPY W/ URETERAL STENT PLACEMENT  03/11/15  . CYSTOSCOPY WITH RETROGRADE PYELOGRAM, URETEROSCOPY AND STENT PLACEMENT Left 03/17/2015   Procedure: CYSTOSCOPY, STENT REMOVAL, LEFT URETEROSCOPY  WITH STONE REMOVAL WITH BASKET;  Surgeon: Irine Seal, MD;  Location: [facility];  Service: Urology;  Laterality: Left;  . SHOULDER ARTHROSCOPY DISTAL CLAVICLE EXCISION AND OPEN ROTATOR CUFF REPAIR    . TUBAL LIGATION      Prior to Admission medications   Medication Sig Start Date End Date Taking? Authorizing Provider  albuterol (PROVENTIL HFA;VENTOLIN HFA) 108 (90 BASE) MCG/ACT inhaler Inhale 1 puff into the lungs every 6 (six) hours as needed for wheezing or shortness of breath.    [provider]  atorvastatin (LIPITOR) 40 MG tablet Take 40 mg by mouth daily.    [provider]  canagliflozin (INVOKANA) 300 MG TABS tablet Take by mouth. 03/01/19 02/29/20  [provider]  clotrimazole-betamethasone (LOTRISONE) cream Apply 1 application topically 2 (two) times daily. 10/21/19   [name] A, PA-C  conjugated estrogens (PREMARIN) vaginal cream Apply 0.5mg  (pea-sized amount)  just inside the vaginal introitus with  a finger-tip on  Monday, Wednesday and Friday nights. 02/17/19   [name] A, PA-C  diclofenac sodium (VOLTAREN) 1 % GEL Apply 4 g topically 4 (four) times daily.    [provider]  divalproex (DEPAKOTE ER) 250 MG 24 hr tablet Take 500-750 mg by mouth 2 (two) times daily. Take 500mg  in morning and take 750mg  at night     [provider]  estradiol (ESTRACE VAGINAL) 0.1 MG/GM vaginal cream Apply 0.5mg  (pea-sized amount)  just inside the vaginal introitus with a finger-tip on Monday, Wednesday and Friday nights. 02/17/19   McGowan, [name] A, PA-C  fluticasone (FLONASE) 50 MCG/ACT nasal spray Place 2 sprays into both nostrils daily as needed for allergies or rhinitis.    [provider]  glimepiride (AMARYL) 2 MG tablet Take 2 mg by mouth daily with breakfast.    [provider]  losartan (COZAAR) 100 MG tablet Take 50 mg by mouth every morning.     [provider]  metFORMIN (GLUCOPHAGE-XR) 500 MG 24 hr tablet Take 2,000 mg by mouth daily with supper.     [provider]  tamsulosin (FLOMAX) 0.4 MG CAPS capsule Take 1 capsule (0.4 mg total) by mouth daily. 12/17/19   Lavonia Drafts, MD     Allergies Dilantin [phenytoin sodium extended] and Phenytoin  Family History  Problem Relation Age of Onset  . Coronary artery disease Sister   . Heart disease Sister   . Brain cancer Father   . Emphysema Mother     Social History Social History   Tobacco Use  . Smoking status: Never Smoker  . Smokeless tobacco: Never Used  Substance Use Topics  . Alcohol use: No  . Drug use: No    Review of Systems  Constitutional: No fever Eyes: No visual changes.  ENT: No sore throat. Cardiovascular: Denies chest pain. Respiratory: Denies shortness of breath. Gastrointestinal: As above Genitourinary: As above Musculoskeletal: Negative for back pain. Skin: Negative for rash. Neurological: Negative for headaches ss   ____________________________________________   PHYSICAL EXAM:  VITAL SIGNS: ED Triage Vitals  Enc Vitals Group     BP 12/17/19 1001 (!) 142/100     Pulse Rate 12/17/19 1001 86     Resp 12/17/19 1001 15     Temp 12/17/19 1001 98.6 F (37 C)     Temp Source 12/17/19 1001 Oral     SpO2 12/17/19 1001 99 %     Weight 12/17/19 0958 78.9 kg (174 lb)      Height 12/17/19 0958 1.626 m (5\' 4" )     Head Circumference --      Peak Flow --      Pain Score 12/17/19 0958 10     Pain Loc --      Pain Edu? --      Excl. in [place]? --     Constitutional: Alert and oriented.   Nose: No congestion/rhinnorhea. Mouth/Throat: Mucous membranes are moist.   Neck:  Painless ROM Cardiovascular: Normal rate, regular rhythm.  Good peripheral circulation. Respiratory: Normal respiratory effort.  No retractions. Lungs CTAB. Gastrointestinal: Soft and nontender. No distention.  No CVA tenderness.  Reassuring exam  Musculoskeletal:   Warm and well perfused Neurologic:  Normal speech and language. No gross focal neurologic deficits are appreciated.  Skin:  Skin is warm, dry and intact. No rash noted. Psychiatric: Mood and affect are normal. Speech and behavior are normal.  ____________________________________________   LABS (all labs ordered are listed, but only  abnormal results are displayed)  Labs Reviewed  URINALYSIS, COMPLETE (UACMP) WITH MICROSCOPIC - Abnormal; Notable for the following components:      Result Value   Color, Urine YELLOW (*)    APPearance HAZY (*)    Glucose, UA >=500 (*)    Hgb urine dipstick LARGE (*)    Ketones, ur 5 (*)    Protein, ur 100 (*)    Leukocytes,Ua TRACE (*)    RBC / HPF >50 (*)    Bacteria, UA RARE (*)    All other components within normal limits  COMPREHENSIVE METABOLIC PANEL - Abnormal; Notable for the following components:   Glucose, Bld 378 (*)    Calcium 8.7 (*)    All other components within normal limits  CBC WITH DIFFERENTIAL/PLATELET   ____________________________________________  EKG  None ____________________________________________  RADIOLOGY  CT renal stone study demonstrates proximal right ureter stone 4 mm ____________________________________________   PROCEDURES  Procedure(s) performed: No  Procedures   Critical Care performed:  No ____________________________________________   INITIAL IMPRESSION / ASSESSMENT AND PLAN / ED COURSE  Pertinent labs & imaging results that were available during my care of the patient were reviewed by me and considered in my medical decision making (see chart for details).  Patient presents with right flank pain, highly suspicious for ureterolithiasis.  We will obtain CT imaging, give IV Toradol 15 mg,, IV Zofran check labs and urinalysis and reevaluate  CT scan is consistent with ureterolithiasis 4 mm proximal stone.  Lab work is overall reassuring, pending urinalysis.  Urinalysis with 6-10 epis, no nitrites, afebrile, normal white blood cell count, appropriate for discharge at this time as pain has improved    ____________________________________________   FINAL CLINICAL IMPRESSION(S) / ED DIAGNOSES  Final diagnoses:  Kidney stone        Note:  This document was prepared using Dragon voice recognition software and may include unintentional dictation errors.   Lavonia Drafts, MD 12/17/19 1249

## 2019-12-17 NOTE — ED Triage Notes (Signed)
Pt reports right flank pain that started yesterday - pt reports hx of kidney stones and this is the same pain

## 2019-12-23 ENCOUNTER — Ambulatory Visit (INDEPENDENT_AMBULATORY_CARE_PROVIDER_SITE_OTHER): Payer: Medicare Other

## 2019-12-23 ENCOUNTER — Other Ambulatory Visit: Payer: Self-pay

## 2019-12-23 DIAGNOSIS — N201 Calculus of ureter: Secondary | ICD-10-CM

## 2019-12-23 DIAGNOSIS — N3941 Urge incontinence: Secondary | ICD-10-CM | POA: Diagnosis not present

## 2019-12-23 NOTE — Progress Notes (Signed)
PTNS  Session # monthly Maintenance   Health & Social Factors: Patient states that she had to go to the ED last weekend due to stone passage. 67mm stone was seen pain was controled and patient was encourage to increase fluids and stone should pass on own. Patient states that she believes her stone passed Saturday, notes seeing on tissue when wiping. She states she is feeling much better now and denies symptoms. Caffeine: 3 Alcohol: 0 Daytime voids #per day: 7 Night-time voids #per night: 1 Urgency: mild Incontinence Episodes #per day: 0 Ankle used: left Treatment Setting: 1 Feeling/ Response: both Comments: none  Preformed By: Fonnie Jarvis, CMA  Follow Up: 1 month

## 2019-12-23 NOTE — Patient Instructions (Signed)
Tracking Your Bladder Symptoms    Patient Name:___________________________________________________   Sample: Day   Daytime Voids  Nighttime Voids Urgency for the Day(0-4) Number of Accidents Beverage Comments  Monday IIII II 2 I Water IIII Coffee  I      Week Starting:____________________________________   Day Daytime  Voids Nighttime  Voids Urgency for  The Day(0-4) Number of Accidents Beverages Comments                                                           This week my symptoms were:  O much better  O better O the same O worse   

## 2019-12-23 NOTE — Addendum Note (Signed)
Addended by: Tommy Rainwater on: 12/23/2019 08:49 AM   Modules accepted: Orders

## 2019-12-28 ENCOUNTER — Other Ambulatory Visit: Payer: Self-pay | Admitting: Family Medicine

## 2019-12-29 ENCOUNTER — Other Ambulatory Visit: Payer: Self-pay | Admitting: Family Medicine

## 2019-12-29 MED ORDER — CLOTRIMAZOLE-BETAMETHASONE 1-0.05 % EX CREA: 1.0000 "application " | TOPICAL_CREAM | Freq: Two times a day (BID) | CUTANEOUS | 3 refills | Status: DC

## 2020-01-10 ENCOUNTER — Encounter: Payer: Self-pay | Admitting: Podiatry

## 2020-01-10 ENCOUNTER — Ambulatory Visit (INDEPENDENT_AMBULATORY_CARE_PROVIDER_SITE_OTHER): Payer: Medicare Other | Admitting: Podiatry

## 2020-01-10 ENCOUNTER — Other Ambulatory Visit: Payer: Self-pay

## 2020-01-10 DIAGNOSIS — M79609 Pain in unspecified limb: Secondary | ICD-10-CM | POA: Diagnosis not present

## 2020-01-10 DIAGNOSIS — B351 Tinea unguium: Secondary | ICD-10-CM

## 2020-01-10 DIAGNOSIS — E119 Type 2 diabetes mellitus without complications: Secondary | ICD-10-CM

## 2020-01-10 NOTE — Progress Notes (Signed)
Complaint:  Visit Type: Patient returns to my office for continued preventative foot care services. Complaint: Patient states" my nails have grown long and thick and become painful to walk and wear shoes" Patient has been diagnosed with DM with no foot complications. The patient presents for preventative foot care services. No changes to ROS  Podiatric Exam: Vascular: dorsalis pedis and posterior tibial pulses are palpable bilateral. Capillary return is immediate. Temperature gradient is WNL. Skin turgor WNL  Sensorium: Normal Semmes Weinstein monofilament test. Normal tactile sensation bilaterally. Nail Exam: Pt has thick disfigured discolored nails with subungual debris noted bilateral entire nail hallux through fifth toenails Ulcer Exam: There is no evidence of ulcer or pre-ulcerative changes or infection. Orthopedic Exam: Muscle tone and strength are WNL. No limitations in general ROM. No crepitus or effusions noted. HAV  B/L. Skin: No Porokeratosis. No infection or ulcers  Diagnosis:  Onychomycosis, , Pain in right toe, pain in left toes  Treatment & Plan Procedures and Treatment: Consent by patient was obtained for treatment procedures. The patient understood the discussion of treatment and procedures well. All questions were answered thoroughly reviewed. Debridement of mycotic and hypertrophic toenails, 1 through 5 bilateral and clearing of subungual debris. No ulceration, no infection noted.  Return Visit-Office Procedure: Patient instructed to return to the office for a follow up visit 3 months for continued evaluation and treatment.    Pietra Zuluaga DPM 

## 2020-01-18 ENCOUNTER — Other Ambulatory Visit: Payer: Self-pay

## 2020-01-18 ENCOUNTER — Ambulatory Visit
Admission: RE | Admit: 2020-01-18 | Discharge: 2020-01-18 | Disposition: A | Discharge status: Home / Self Care | Payer: Medicare Other | Source: Ambulatory Visit | Attending: Urology | Admitting: Urology

## 2020-01-18 DIAGNOSIS — N201 Calculus of ureter: Secondary | ICD-10-CM | POA: Insufficient documentation

## 2020-01-20 ENCOUNTER — Encounter: Payer: Self-pay | Admitting: Urology

## 2020-01-20 ENCOUNTER — Ambulatory Visit (INDEPENDENT_AMBULATORY_CARE_PROVIDER_SITE_OTHER): Payer: Medicare Other | Admitting: Urology

## 2020-01-20 ENCOUNTER — Other Ambulatory Visit: Payer: Self-pay

## 2020-01-20 DIAGNOSIS — Z87448 Personal history of other diseases of urinary system: Secondary | ICD-10-CM

## 2020-01-20 DIAGNOSIS — N201 Calculus of ureter: Secondary | ICD-10-CM

## 2020-01-20 DIAGNOSIS — N3941 Urge incontinence: Secondary | ICD-10-CM | POA: Diagnosis not present

## 2020-01-20 NOTE — Progress Notes (Signed)
01/20/2020 10:36 AM   Crystal Foley 04-30-1951 [id number]  Referring provider: [name], MD [name],  [name] [address]  Chief Complaint  Patient presents with  . PTNS    HPI: Crystal Foley is a 69 y.o. female with a history of nephrolithiasis, vaginal atrophy and incontinence who presents today for an annual follow up.    History of nephrolithiasis Patient was seen at [facility] ED on 03/12/2015 for a 9 mm left UPJ stone with intractable pain and vomiting.  Dr. Jeffie Pollock placed a left ureteral stent at that time and she followed up at [facility] for definitive treatment of the stone.  Dr. Jeffie Pollock performed a left ureteroscopic stone extraction and cystoscopy with removal of left double-J stent on 03/17/2015.  RUS on 06/14/2015 noted no hydronephrosis and no stones.  24 hour urine results indicate a low urinary volume and increased oxalates.  A CT renal stone study on 12/21/2018 reported the impression of:  1. Mild left hydroureteronephrosis. There is a 4 mm stone in the dependent bladder, likely recently passed stone. No additional ureteral or obstructing calculi.  2. Nonobstructing stones in both kidneys. Parapelvic cysts in both Kidneys.  3. Minimal distal colonic diverticulosis without diverticulitis.  4. Aortic Atherosclerosis (ICD10-I70.0).  RUS in 02/2019 large bilateral parapelvic renal cysts without definite hydronephrosis.  No worrisome renal lesions. No definite renal calculi by ultrasound.  Normal bladder.  Bilateral ureteral jets are noted.   She was seen in the ED on 12/17/2019 for flank pain.  CT renal stone 12/17/2019 Proximal right ureteric stone, without significant hydroureter.  Bilateral nephrolithiasis.  Possible constipation.  Pelvic floor laxity.  Aortic Atherosclerosis Coronary artery atherosclerosis.  She has passed that stone.  RUS February 08, 2020 No urinary tract stones are identified on this examination.  Bilateral renal sinus  cysts.  Today, she is not having any flank pain or gross hematuria.  Patient denies any modifying or aggravating factors.  Patient denies any dysuria or suprapubic.  Patient denies any fevers, chills, nausea or vomiting.   Vaginal atrophy She is not using the vaginal estrogen cream at this time.    Incontinence PTNS Session # monthly  Health & Social Factors: None Caffeine: 1 Alcohol: 0 Daytime voids #per day: 7 Night-time voids #per night: 1 Urgency: Mild Incontinence Episodes #per day: 0 Ankle used: Left Treatment Setting: 4 Feeling/ Response: Both Comments: Patient tolerated the procedure well  Performed By: [name], PA-C  PMH: Past Medical History:  Diagnosis Date  . Asthma   . Bilateral kidney stones   . COPD (chronic obstructive pulmonary disease) ([place])   . Diabetes mellitus   . Generalized convulsive epilepsy ([place])   . 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 Hematuria, gross   . Hydronephrosis   . Hypercholesterolemia   . Hypertension   . Hypothyroidism   . Nephrolithiasis   . Nephrolithiasis   . Seizures ([place])    last seizure 7-8 yrs ago  . Shortness of breath dyspnea    with exertion    Surgical History: Past Surgical History:  Procedure Laterality Date  . Bladder tack    . COLONOSCOPY    . COLONOSCOPY WITH PROPOFOL N/A 03/25/2018   Procedure: COLONOSCOPY WITH PROPOFOL;  Surgeon: Toledo, Benay Pike, MD;  Location: [facility] ENDOSCOPY;  Service: Gastroenterology;  Laterality: N/A;  . COLONOSCOPY WITH PROPOFOL N/A 05/13/2018   Procedure: COLONOSCOPY WITH PROPOFOL;  Surgeon: Alice Reichert, Benay Pike, MD;  Location: [facility] ENDOSCOPY;  Service: Gastroenterology;  Laterality: N/A;  . CYSTOSCOPY W/ URETERAL STENT PLACEMENT  03/11/15  . CYSTOSCOPY WITH RETROGRADE PYELOGRAM, URETEROSCOPY AND STENT PLACEMENT Left 03/17/2015   Procedure: CYSTOSCOPY, STENT REMOVAL, LEFT URETEROSCOPY  WITH STONE REMOVAL WITH BASKET;  Surgeon: Irine Seal, MD;   Location: [facility] ORS;  Service: Urology;  Laterality: Left;  . SHOULDER ARTHROSCOPY DISTAL CLAVICLE EXCISION AND OPEN ROTATOR CUFF REPAIR    . TUBAL LIGATION      Home Medications:  Allergies as of 01/20/2020      Reactions   Dilantin [phenytoin Sodium Extended] Swelling   Phenytoin Swelling      Medication List       Accurate as of January 20, 2020 10:36 AM. If you have any questions, ask your nurse or doctor.        albuterol 108 (90 Base) MCG/ACT inhaler Commonly known as: VENTOLIN HFA Inhale 1 puff into the lungs every 6 (six) hours as needed for wheezing or shortness of breath.   atorvastatin 40 MG tablet Commonly known as: LIPITOR Take 40 mg by mouth daily.   canagliflozin 300 MG Tabs tablet Commonly known as: INVOKANA Take by mouth.   clotrimazole-betamethasone cream Commonly known as: LOTRISONE Apply 1 application topically 2 (two) times daily.   conjugated estrogens vaginal cream Commonly known as: Premarin Apply 0.5mg  (pea-sized amount)  just inside the vaginal introitus with a finger-tip on  Monday, Wednesday and Friday nights.   diclofenac sodium 1 % Gel Commonly known as: VOLTAREN Apply 4 g topically 4 (four) times daily.   divalproex 250 MG 24 hr tablet Commonly known as: DEPAKOTE ER Take 500-750 mg by mouth 2 (two) times daily. Take 500mg  in morning and take 750mg  at night   estradiol 0.1 MG/GM vaginal cream Commonly known as: ESTRACE VAGINAL Apply 0.5mg  (pea-sized amount)  just inside the vaginal introitus with a finger-tip on Monday, Wednesday and Friday nights.   fluticasone 50 MCG/ACT nasal spray Commonly known as: FLONASE Place 2 sprays into both nostrils daily as needed for allergies or rhinitis.   glimepiride 2 MG tablet Commonly known as: AMARYL Take 2 mg by mouth daily with breakfast.   losartan 100 MG tablet Commonly known as: COZAAR Take 50 mg by mouth every morning.   metFORMIN 500 MG 24 hr tablet Commonly known as:  GLUCOPHAGE-XR Take 2,000 mg by mouth daily with supper.   tamsulosin 0.4 MG Caps capsule Commonly known as: Flomax Take 1 capsule (0.4 mg total) by mouth daily.       Allergies:  Allergies  Allergen Reactions  . Dilantin [Phenytoin Sodium Extended] Swelling  . Phenytoin Swelling    Family History: Family History  Problem Relation Age of Onset  . Coronary artery disease Sister   . Heart disease Sister   . Brain cancer Father   . Emphysema Mother     Social History:  reports that she has never smoked. She has never used smokeless tobacco. She reports that she does not drink alcohol or use drugs.  ROS: Urological Symptom Review UROLOGY Frequent Urination?: No Hard to postpone urination?: No Burning/pain with urination?: No Get up at night to urinate?: Yes Leakage of urine?: Yes Urine stream starts and stops?: No Trouble starting stream?: No Do you have to strain to urinate?: No Blood in urine?: No Urinary tract infection?: No Sexually transmitted disease?: No Injury to kidneys or bladder?: No Painful intercourse?: No Weak stream?: No Currently pregnant?: No Vaginal bleeding?: No Last menstrual period?:  n Gastrointestinal Nausea?: No Vomiting?: No Indigestion/heartburn?: No Diarrhea?: No Constipation?: No Constitutional Fever: No Night sweats?: No Weight loss?: No Fatigue?: No Skin Skin rash/lesions?: No Itching?: No Eyes Blurred vision?: No Double vision?: No Ears/Nose/Throat Sore throat?: No Sinus problems?: No Hematologic/Lymphatic Swollen glands?: No Easy bruising?: No Cardiovascular Leg swelling?: No Chest pain?: No Respiratory Cough?: No Shortness of breath?: No Endocrine Excessive thirst?: No Musculoskeletal Back pain?: No Joint pain?: No Neurological Headaches?: No Dizziness?: No Psychologic Depression?: No Anxiety?: No  Physical Exam: There were no vitals taken for this visit.  Constitutional:  Well nourished. Alert and  oriented, No acute distress. HEENT: [place] AT, mask in place.  Trachea midline, no masses. Cardiovascular: No clubbing, cyanosis, or edema. Respiratory: Normal respiratory effort, no increased work of breathing. Neurologic: Grossly intact, no focal deficits, moving all 4 extremities. Psychiatric: Normal mood and affect.   Laboratory Data: Lab Results  Component Value Date   WBC 5.7 12/17/2019   HGB 14.5 12/17/2019   HCT 42.1 12/17/2019   MCV 88.1 12/17/2019   PLT 191 12/17/2019    Lab Results  Component Value Date   CREATININE 0.75 12/17/2019   Lab Results  Component Value Date   AST 24 12/17/2019   Lab Results  Component Value Date   ALT 19 12/17/2019   I have reviewed the labs.  Pertinent Imaging: CLINICAL DATA:  History of urinary tract stones.  EXAM: RENAL / URINARY TRACT ULTRASOUND COMPLETE  COMPARISON:  CT abdomen and pelvis 12/17/2019 and 12/22/2019.  FINDINGS: Right Kidney:  Renal measurements: 12.8 x 6.3 x 5.6 cm = volume: 238 mL . Echogenicity within normal limits. No solid mass or hydronephrosis visualized. Multiple renal sinus cysts are identified as seen on the prior CT scans. No stone is seen. Punctate proximal right ureteral stone seen on the most recent CT is not visible on this exam.  Left Kidney:  Renal measurements: 13.3 x 7.1 x 7.1 = volume: 351 mL. Echogenicity within normal limits. No solid mass or hydronephrosis visualized. Multiple renal sinus cysts identified. Small stones in the upper pole seen on the prior CT are not visible on this examination.  Bladder:  Appears normal for degree of bladder distention. Bilateral ureteral jets are seen.  Other:  None.  IMPRESSION: No urinary tract stones are identified on this examination.  Bilateral renal sinus cysts.   Electronically Signed   By: Inge Rise M.D.   On: 01/18/2020 14:43 I have independently reviewed the films and do not appreciate any  stones.  Assessment & Plan:    1. History of nephrolithiasis Recent passage of fragments RUS negative for stones and hydro Given ABC's of stone prevention  2. History of hematuria Will recheck UA on return  3. Urge incontinence Continue PTNS  4. Vaginal atrophy Not using creams  Return in about 1 month (around 02/17/2020) for PTNS and UA .  [name], PA-C  [facility] [address] [place], [place] [address] [phone]

## 2020-02-16 ENCOUNTER — Ambulatory Visit (INDEPENDENT_AMBULATORY_CARE_PROVIDER_SITE_OTHER): Payer: Medicare Other | Admitting: Urology

## 2020-02-16 ENCOUNTER — Encounter: Payer: Self-pay | Admitting: Urology

## 2020-02-16 ENCOUNTER — Other Ambulatory Visit: Payer: Self-pay

## 2020-02-16 DIAGNOSIS — N3941 Urge incontinence: Secondary | ICD-10-CM | POA: Diagnosis not present

## 2020-02-16 DIAGNOSIS — Z87448 Personal history of other diseases of urinary system: Secondary | ICD-10-CM

## 2020-02-16 NOTE — Progress Notes (Signed)
PTNS  Session # Monthly Maintenance  Health & Social Factors:  Caffeine: 0 - patient has went from 16 cans of diet Coke to 0 cans of diet Coke Alcohol: 0 Daytime voids #per day: 7-9 Night-time voids #per night: 0 Urgency: None Incontinence Episodes #per day: 0 Ankle used: left Treatment Setting: 4 Feeling/ Response: Sensory Comments: Patient states that she has noticed a definite improvement since eliminating diet Coca-Cola's.  She would like to discontinue her PTNS treatments after next months mainly maintenance.  Performed By: [name], PA-C  Follow Up: one month PTNS and UA -she could not provide urine for urinalysis today to check to ensure the microscopic hematuria cleared with the passage of her stone.  I have given her a cup and she will bring a urine sample with her at her next appointment.

## 2020-02-21 IMAGING — CT CT RENAL STONE PROTOCOL
2 of 4 series · 16 of 46 positions shown, 18 images · non-contrast
Comparison: CT 03/11/2015

CLINICAL DATA: Left flank pain. Hematuria.

EXAM:
CT ABDOMEN AND PELVIS WITHOUT CONTRAST
TECHNIQUE: Multidetector CT imaging of the abdomen and pelvis was performed
following the standard protocol without IV contrast.

[Series 2: stone full standard · axial · 0.80mm/px · z∈[-1167,-727]mm · 13 of 98 slices shown, 15 images]
[im 5/98  soft-tissue]
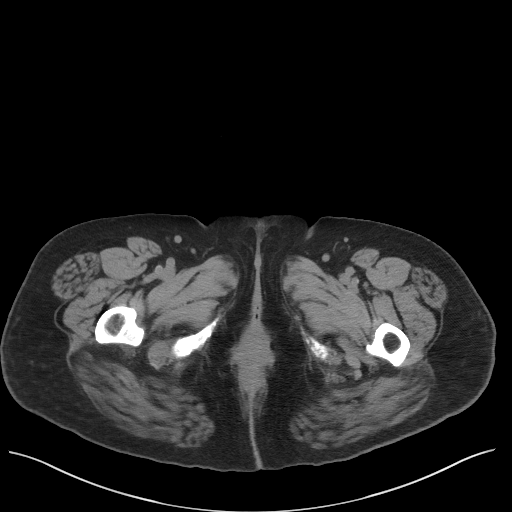
[im 5/98  bone]
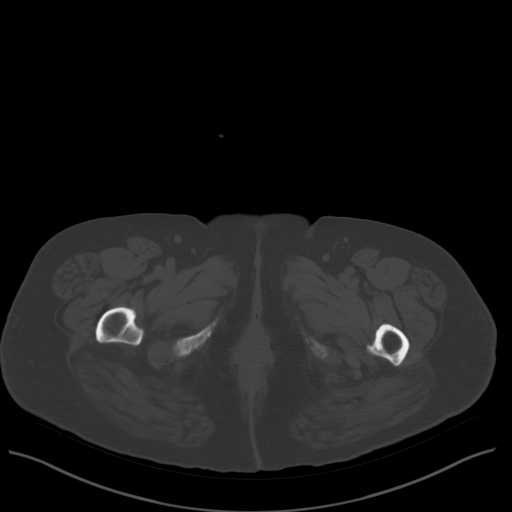
[im 13/98  soft-tissue]
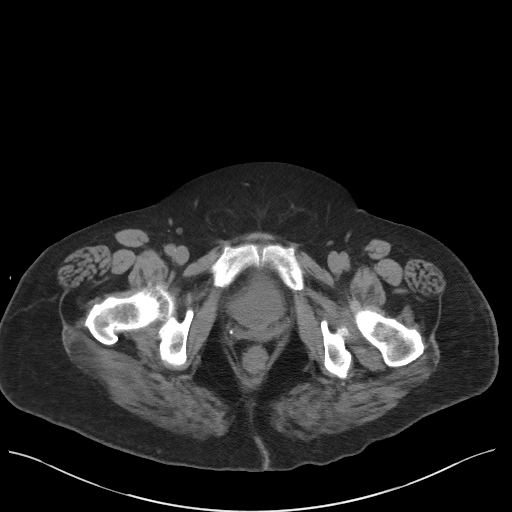
[im 21/98  soft-tissue]
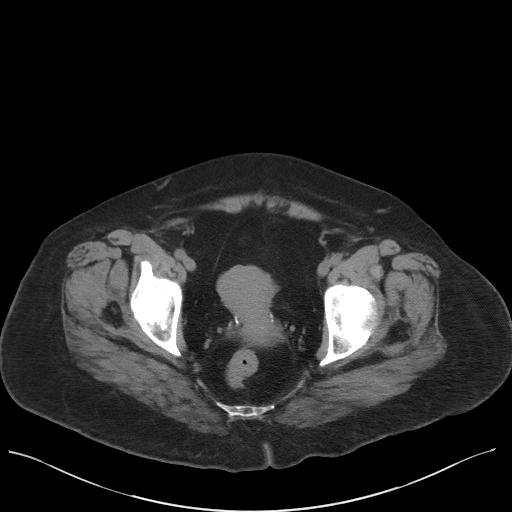
[im 29/98  soft-tissue]
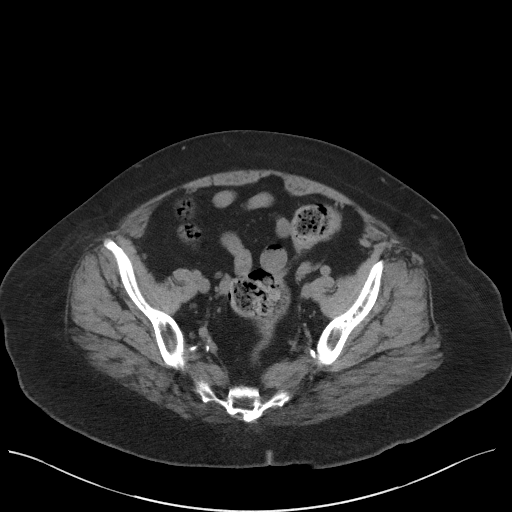
[im 33/98  soft-tissue]
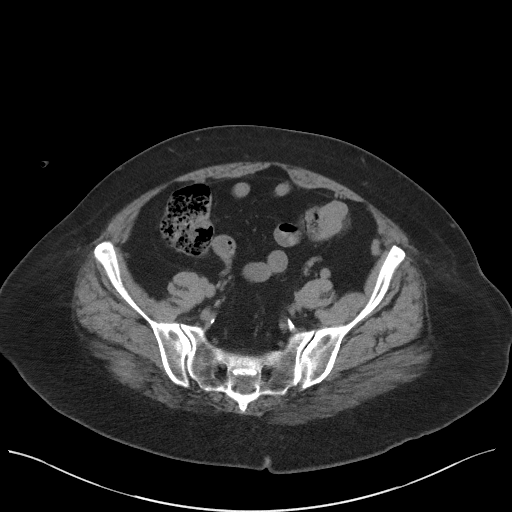
[im 41/98  soft-tissue]
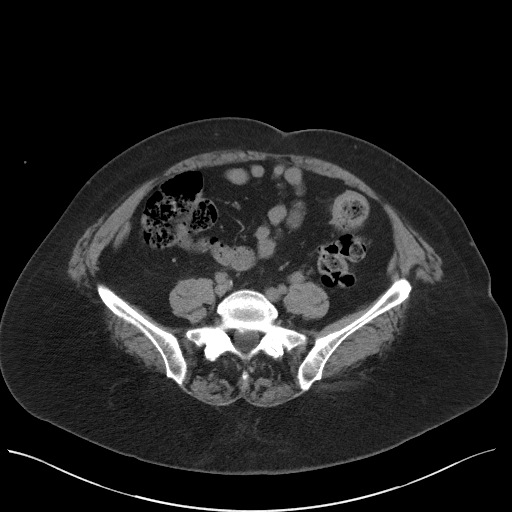
[im 49/98  soft-tissue]
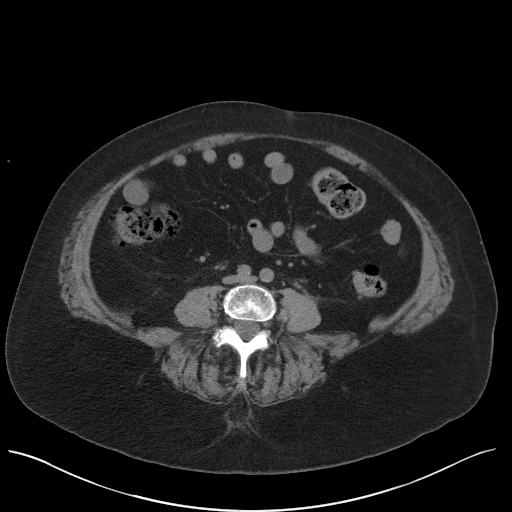
[im 57/98  soft-tissue]
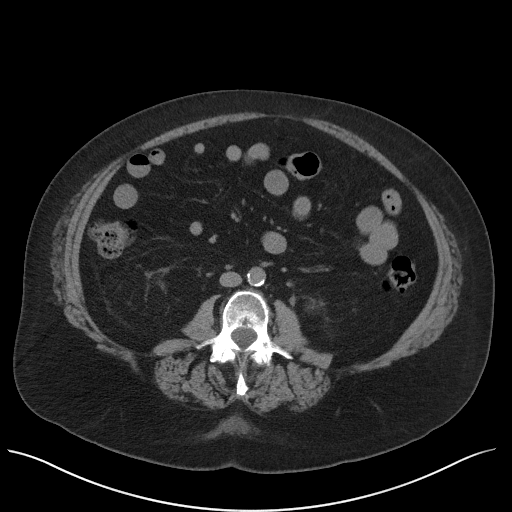
[im 65/98  soft-tissue]
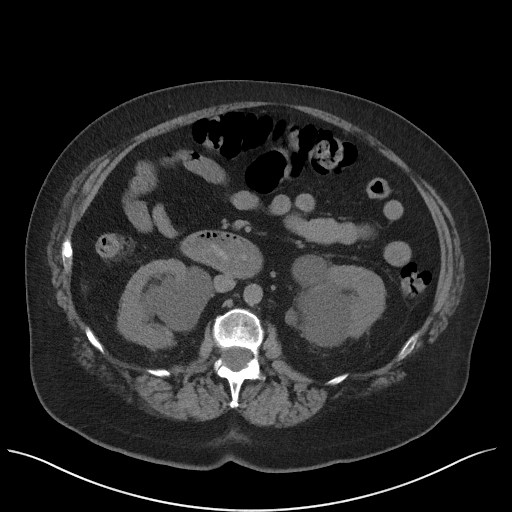
[im 65/98  bone]
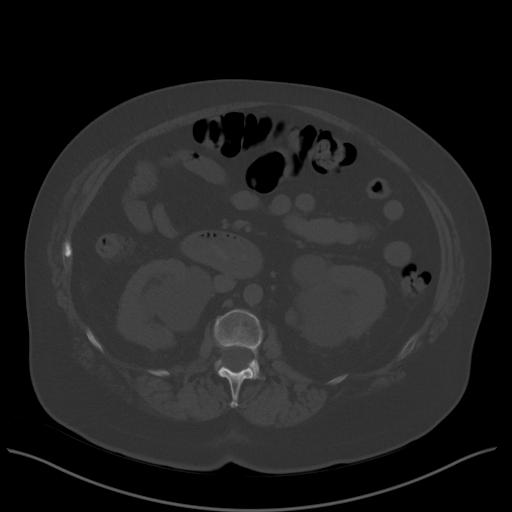
[im 69/98  soft-tissue]
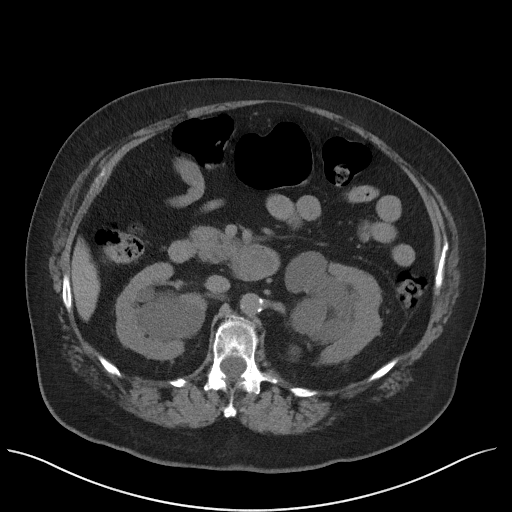
[im 77/98  soft-tissue]
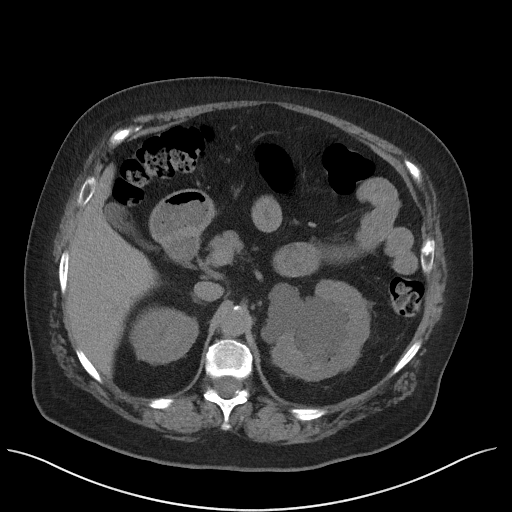
[im 85/98  soft-tissue]
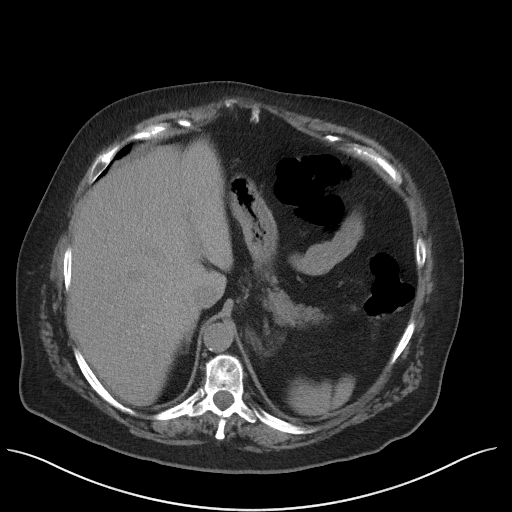
[im 93/98  soft-tissue]
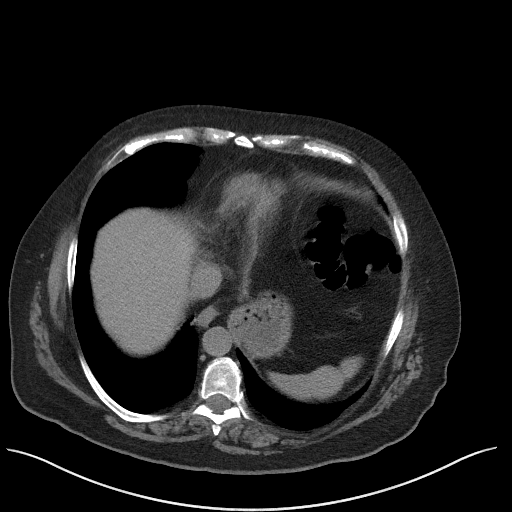

[Series 5: coronal · coronal · 0.81mm/px · 3 of 158 slices shown]
[im 53/158  soft-tissue]
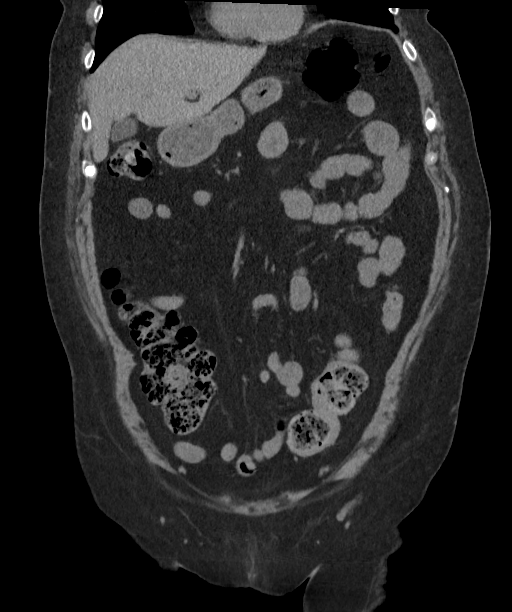
[im 70/158  soft-tissue]
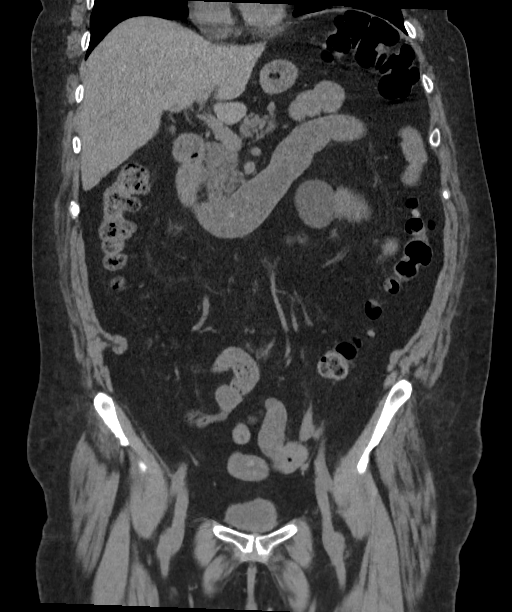
[im 88/158  soft-tissue]
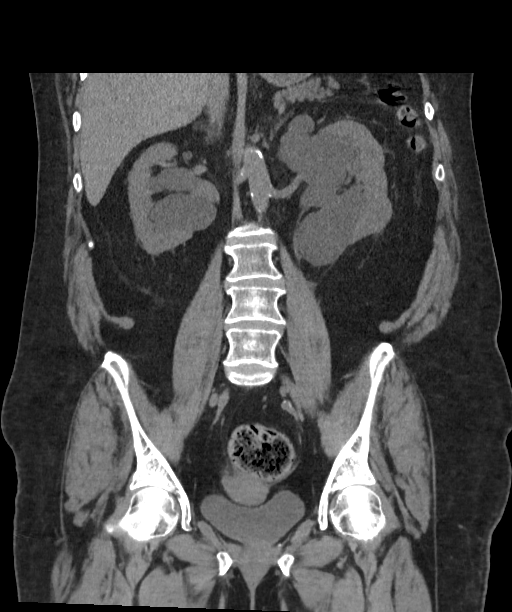

[16 of 46 positions shown; findings below may reference images not displayed]

FINDINGS: Lower chest: Mild elevation of left hemidiaphragm. No pleural fluid
or consolidation.

Hepatobiliary: No focal liver abnormality is seen. No gallstones,
gallbladder wall thickening, or biliary dilatation.

Pancreas: No ductal dilatation or inflammation.

Spleen: Normal in size without focal abnormality.

Adrenals/Urinary Tract: Normal adrenal glands. Left hydronephrosis
and prominence of the ureter without ureteral calculi. There is a 4
mm stone in the dependent bladder likely recently passed stone.
Distal cystic changes the left renal hilum are likely parapelvic
cysts. There are clustered nonobstructing stones in the upper left
kidney. Stable appearance of the right kidney with parapelvic cysts
versus UPJ obstruction. Parapelvic cysts are favored. Punctate
nonobstructing stones in the lower right kidney. The right ureter is
decompressed. 4 mm stone in the dependent bladder, no bladder wall
thickening.

Stomach/Bowel: Stomach physiologically distended. Ingested material
within the distal duodenum and proximal jejunum without obstruction
or inflammation. More distal small bowel is decompressed. Moderate
volume of stool throughout the colon, with areas of interspersed
decompressed sigmoid colon. Significant distal colonic tortuosity.
Minimal diverticulosis of the distal colon without diverticulitis.
Ovoid fat density abutting the anti mesenteric border of the
tortuous sigmoid colon may represent sequela of prior epiploic
appendagitis. No acute inflammatory change. Normal appendix.

Vascular/Lymphatic: Mild aorta bi-iliac atherosclerosis without
aneurysm. No adenopathy.

Reproductive: Uterus and bilateral adnexa are unremarkable.

Other: No free air, free fluid, or intra-abdominal fluid collection.

Musculoskeletal: There are no acute or suspicious osseous
abnormalities.
IMPRESSION: 1. Mild left hydroureteronephrosis. There is a 4 mm stone in the
dependent bladder, likely recently passed stone. No additional
ureteral or obstructing calculi.
2. Nonobstructing stones in both kidneys. Parapelvic cysts in both
kidneys.
3. Minimal distal colonic diverticulosis without diverticulitis.
4.  Aortic Atherosclerosis ([id number]).

## 2020-03-22 ENCOUNTER — Ambulatory Visit (INDEPENDENT_AMBULATORY_CARE_PROVIDER_SITE_OTHER): Payer: Medicare Other | Admitting: Urology

## 2020-03-22 ENCOUNTER — Other Ambulatory Visit: Payer: Self-pay

## 2020-03-22 DIAGNOSIS — N3941 Urge incontinence: Secondary | ICD-10-CM | POA: Diagnosis not present

## 2020-03-22 LAB — URINALYSIS, COMPLETE
Bilirubin, UA: NEGATIVE
Leukocytes,UA: NEGATIVE
Nitrite, UA: NEGATIVE
Protein,UA: NEGATIVE
RBC, UA: NEGATIVE
Specific Gravity, UA: 1.025 (ref 1.005–1.030)
Urobilinogen, Ur: 0.2 mg/dL (ref 0.2–1.0)
pH, UA: 6 (ref 5.0–7.5)

## 2020-03-22 LAB — MICROSCOPIC EXAMINATION: RBC, Urine: NONE SEEN /hpf (ref 0–2)

## 2020-03-22 NOTE — Progress Notes (Signed)
PTNS  Session # Monthly Maintenance   Health & Social Factors:  Caffeine: 0 Alcohol: 0 Daytime voids #per day: 7-8 Night-time voids #per night: 0 Urgency: mild Incontinence Episodes #per day: 0 Ankle used: Left Treatment Setting: 2 Feeling/ Response: Both Comments: Patient tolerated the procedure well  Performed By: Nori Riis, PA-C  Assistant: Kyra Manges, CMA  UA clear of micro heme  Follow Up:  One year for xray, OAB questionnaire and PVR   We discussed as this is her last PTNS treatment what her course of action would be if her urinary symptoms worsen in the upcoming months.  Advised her to contact our office so that we may check with her insurance and repeat the PTNS.

## 2020-04-10 ENCOUNTER — Encounter: Payer: Self-pay | Admitting: Podiatry

## 2020-04-10 ENCOUNTER — Ambulatory Visit (INDEPENDENT_AMBULATORY_CARE_PROVIDER_SITE_OTHER): Payer: Medicare Other | Admitting: Podiatry

## 2020-04-10 ENCOUNTER — Other Ambulatory Visit: Payer: Self-pay

## 2020-04-10 VITALS — Temp 97.8°F

## 2020-04-10 DIAGNOSIS — M79676 Pain in unspecified toe(s): Secondary | ICD-10-CM

## 2020-04-10 DIAGNOSIS — B351 Tinea unguium: Secondary | ICD-10-CM | POA: Diagnosis not present

## 2020-04-10 DIAGNOSIS — E119 Type 2 diabetes mellitus without complications: Secondary | ICD-10-CM | POA: Diagnosis not present

## 2020-04-10 DIAGNOSIS — M79609 Pain in unspecified limb: Secondary | ICD-10-CM

## 2020-04-10 NOTE — Progress Notes (Signed)

## 2020-05-02 ENCOUNTER — Other Ambulatory Visit: Payer: Self-pay

## 2020-05-02 ENCOUNTER — Encounter: Payer: Self-pay | Admitting: Emergency Medicine

## 2020-05-02 ENCOUNTER — Emergency Department: Payer: Medicare Other

## 2020-05-02 ENCOUNTER — Emergency Department
Admission: EM | Admit: 2020-05-02 | Discharge: 2020-05-02 | Disposition: A | Discharge status: Home / Self Care | Payer: Medicare Other | Attending: Student | Admitting: Student

## 2020-05-02 DIAGNOSIS — R1031 Right lower quadrant pain: Secondary | ICD-10-CM | POA: Insufficient documentation

## 2020-05-02 DIAGNOSIS — Z7984 Long term (current) use of oral hypoglycemic drugs: Secondary | ICD-10-CM | POA: Insufficient documentation

## 2020-05-02 DIAGNOSIS — Z79899 Other long term (current) drug therapy: Secondary | ICD-10-CM | POA: Diagnosis not present

## 2020-05-02 DIAGNOSIS — N200 Calculus of kidney: Secondary | ICD-10-CM | POA: Diagnosis not present

## 2020-05-02 DIAGNOSIS — E119 Type 2 diabetes mellitus without complications: Secondary | ICD-10-CM | POA: Diagnosis not present

## 2020-05-02 DIAGNOSIS — R110 Nausea: Secondary | ICD-10-CM | POA: Insufficient documentation

## 2020-05-02 DIAGNOSIS — I10 Essential (primary) hypertension: Secondary | ICD-10-CM | POA: Insufficient documentation

## 2020-05-02 DIAGNOSIS — R109 Unspecified abdominal pain: Secondary | ICD-10-CM

## 2020-05-02 LAB — CBC
HCT: 44 % (ref 36.0–46.0)
Hemoglobin: 14.6 g/dL (ref 12.0–15.0)
MCH: 30.9 pg (ref 26.0–34.0)
MCHC: 33.2 g/dL (ref 30.0–36.0)
MCV: 93.2 fL (ref 80.0–100.0)
Platelets: 201 10*3/uL (ref 150–400)
RBC: 4.72 MIL/uL (ref 3.87–5.11)
RDW: 12.8 % (ref 11.5–15.5)
WBC: 9 10*3/uL (ref 4.0–10.5)
nRBC: 0 % (ref 0.0–0.2)

## 2020-05-02 LAB — BASIC METABOLIC PANEL
Anion gap: 13 (ref 5–15)
BUN: 12 mg/dL (ref 8–23)
CO2: 25 mmol/L (ref 22–32)
Calcium: 8.6 mg/dL — ABNORMAL LOW (ref 8.9–10.3)
Chloride: 97 mmol/L — ABNORMAL LOW (ref 98–111)
Creatinine, Ser: 0.93 mg/dL (ref 0.44–1.00)
GFR calc Af Amer: >60 mL/min (ref >60)
GFR calc non Af Amer: >60 mL/min (ref >60)
Glucose, Bld: 359 mg/dL — ABNORMAL HIGH (ref 70–99)
Potassium: 4 mmol/L (ref 3.5–5.1)
Sodium: 135 mmol/L (ref 135–145)

## 2020-05-02 LAB — URINALYSIS, COMPLETE (UACMP) WITH MICROSCOPIC
Bacteria, UA: NONE SEEN
Bilirubin Urine: NEGATIVE
Glucose, UA: >=500 mg/dL — AB
Ketones, ur: 20 mg/dL — AB
Leukocytes,Ua: NEGATIVE
Nitrite: NEGATIVE
Protein, ur: NEGATIVE mg/dL
RBC / HPF: >50 RBC/hpf — ABNORMAL HIGH (ref 0–5)
Specific Gravity, Urine: 1.036 — ABNORMAL HIGH (ref 1.005–1.030)
pH: 5 (ref 5.0–8.0)

## 2020-05-02 MED ORDER — IBUPROFEN 600 MG PO TABS
600.0000 mg | ORAL_TABLET | Freq: Four times a day (QID) | ORAL | 0 refills | Status: DC | PRN
Start: 2020-05-02 — End: 2020-05-02

## 2020-05-02 MED ORDER — ONDANSETRON HCL 4 MG PO TABS: 4.0000 mg | ORAL_TABLET | Freq: Three times a day (TID) | ORAL | 0 refills | Status: DC | PRN

## 2020-05-02 MED ORDER — OXYCODONE HCL 5 MG PO TABS: 5.0000 mg | ORAL_TABLET | Freq: Four times a day (QID) | ORAL | 0 refills | Status: AC | PRN

## 2020-05-02 MED ORDER — OXYCODONE HCL 5 MG PO TABS: 5.0000 mg | ORAL_TABLET | Freq: Four times a day (QID) | ORAL | 0 refills | Status: DC | PRN

## 2020-05-02 MED ORDER — IBUPROFEN 600 MG PO TABS: 600.0000 mg | ORAL_TABLET | Freq: Four times a day (QID) | ORAL | 0 refills | Status: AC | PRN

## 2020-05-02 MED ORDER — ONDANSETRON 4 MG PO TBDP
4.0000 mg | ORAL_TABLET | Freq: Once | ORAL | Status: AC
  Administered 2020-05-02: 4 mg via ORAL
  Filled 2020-05-02: qty 1

## 2020-05-02 MED ORDER — ONDANSETRON HCL 4 MG PO TABS
4.0000 mg | ORAL_TABLET | Freq: Three times a day (TID) | ORAL | 0 refills | Status: AC | PRN
Start: 2020-05-02 — End: 2020-05-09

## 2020-05-02 MED ORDER — OXYCODONE HCL 5 MG PO TABS
5.0000 mg | ORAL_TABLET | Freq: Once | ORAL | Status: AC
  Administered 2020-05-02: 5 mg via ORAL
  Filled 2020-05-02: qty 1

## 2020-05-02 NOTE — Discharge Instructions (Signed)
Thank you for letting us take care of you in the emergency department today.   Please continue to take any regular, prescribed medications.   You can take Tylenol 1000 mg every 6 hours as needed, but do not exceed more than 4000 mg in one day.   New medications we have prescribed:  Zofran, for nausea Ibuprofen, for pain Oxycodone, for pain you cannot control w/ ibuprofen or Tylenol  Please follow up with: Your urology to review your ER visit and follow up on your symptoms.    Please return to the ER for any new or worsening symptoms.

## 2020-05-02 NOTE — ED Provider Notes (Signed)
[facility] Emergency Department Provider Note  ____________________________________________   First MD Initiated Contact with Patient 05/02/20 1306     (approximate)  I have reviewed the triage vital signs and the nursing notes.  History  Chief Complaint Flank Pain    HPI Crystal Foley is a 69 y.o. female past medical history as below, including history of kidney stones, presents to the emergency department with concern for recurrent kidney stone.  Patient states since last night/early this morning she has had intermittent, colicky right-sided flank pain, which radiates to her groin.  Pain is sharp, shooting.  Moderate in severity.  Slightly improved with pain and nausea medication at home.  Has had an episode of vomiting.  No fevers.  She has noticed hematuria, but no dysuria.  Has required stenting previously.   Past Medical Hx Past Medical History:  Diagnosis Date  . Asthma   . Bilateral kidney stones   . COPD (chronic obstructive pulmonary disease) ([place])   . Diabetes mellitus   . Generalized convulsive epilepsy ([place])   . 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 Hematuria, gross   . Hydronephrosis   . Hypercholesterolemia   . Hypertension   . Hypothyroidism   . Nephrolithiasis   . Nephrolithiasis   . Seizures ([place])    last seizure 7-8 yrs ago  . Shortness of breath dyspnea    with exertion    Problem List Patient Active Problem List   Diagnosis Date Noted  . Aortic atherosclerosis ([place]) 12/29/2018  . Community acquired pneumonia 12/24/2018  . Sepsis ([place]) 12/23/2018  . Health care maintenance 09/19/2015  . Calculus of kidney 06/01/2015  . Hypercholesterolemia without hypertriglyceridemia 09/27/2014  . Pure hypercholesterolemia 09/27/2014  . Adult BMI 30+ 09/25/2014  . Morbid obesity ([place]) 09/25/2014  . Severe obesity (BMI 35.0-35.9 with comorbidity) ([place]) 09/25/2014  . Asthma 05/15/2014  . Benign  hypertension 05/15/2014  . HLD (hyperlipidemia) 05/15/2014  . Seizure ([place]) 05/15/2014  . Diabetes mellitus, type 2 ([place]) 05/15/2014  . 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[place]) 05/15/2014    Past Surgical Hx Past Surgical History:  Procedure Laterality Date  . Bladder tack    . COLONOSCOPY    . COLONOSCOPY WITH PROPOFOL N/A 03/25/2018   Procedure: COLONOSCOPY WITH PROPOFOL;  Surgeon: Toledo, Benay Pike, MD;  Location: [facility] ENDOSCOPY;  Service: Gastroenterology;  Laterality: N/A;  . COLONOSCOPY WITH PROPOFOL N/A 05/13/2018   Procedure: COLONOSCOPY WITH PROPOFOL;  Surgeon: Toledo, Benay Pike, MD;  Location: [facility] ENDOSCOPY;  Service: Gastroenterology;  Laterality: N/A;  . CYSTOSCOPY W/ URETERAL STENT PLACEMENT  03/11/15  . CYSTOSCOPY WITH RETROGRADE PYELOGRAM, URETEROSCOPY AND STENT PLACEMENT Left 03/17/2015   Procedure: CYSTOSCOPY, STENT REMOVAL, LEFT URETEROSCOPY  WITH STONE REMOVAL WITH BASKET;  Surgeon: Irine Seal, MD;  Location: [facility] ORS;  Service: Urology;  Laterality: Left;  . SHOULDER ARTHROSCOPY DISTAL CLAVICLE EXCISION AND OPEN ROTATOR CUFF REPAIR    . TUBAL LIGATION      Medications Prior to Admission medications   Medication Sig Start Date End Date Taking? Authorizing Provider  albuterol (PROVENTIL HFA;VENTOLIN HFA) 108 (90 BASE) MCG/ACT inhaler Inhale 1 puff into the lungs every 6 (six) hours as needed for wheezing or shortness of breath.    [provider]  atorvastatin (LIPITOR) 40 MG tablet Take 40 mg by mouth daily.    [provider]  canagliflozin (INVOKANA) 300 MG TABS tablet Take by mouth. 03/01/19   [provider]  clotrimazole-betamethasone (LOTRISONE) cream Apply 1 application topically 2 (two) times daily. 12/29/19   [name] A, PA-C  conjugated estrogens (PREMARIN) vaginal cream Apply 0.5mg  (pea-sized amount)  just inside the vaginal introitus with a finger-tip on  Monday, Wednesday and Friday nights. 02/17/19    [name] A, PA-C  diclofenac sodium (VOLTAREN) 1 % GEL Apply 4 g topically 4 (four) times daily.    [provider]  divalproex (DEPAKOTE ER) 250 MG 24 hr tablet Take 500-750 mg by mouth 2 (two) times daily. Take 500mg  in morning and take 750mg  at night    [provider]  estradiol (ESTRACE VAGINAL) 0.1 MG/GM vaginal cream Apply 0.5mg  (pea-sized amount)  just inside the vaginal introitus with a finger-tip on Monday, Wednesday and Friday nights. 02/17/19   McGowan, [name] A, PA-C  fluticasone (FLONASE) 50 MCG/ACT nasal spray Place 2 sprays into both nostrils daily as needed for allergies or rhinitis.    [provider]  glimepiride (AMARYL) 2 MG tablet TAKE 1 TABLET DAILY WITH  BREAKFAST 03/20/20   [provider]  losartan (COZAAR) 100 MG tablet Take 50 mg by mouth every morning.     [provider]  metFORMIN (GLUCOPHAGE-XR) 500 MG 24 hr tablet Take 2,000 mg by mouth daily with supper.     [provider]  tamsulosin (FLOMAX) 0.4 MG CAPS capsule Take 1 capsule (0.4 mg total) by mouth daily. 12/17/19   Lavonia Drafts, MD    Allergies Dilantin [phenytoin sodium extended] and Phenytoin  Family Hx Family History  Problem Relation Age of Onset  . Coronary artery disease Sister   . Heart disease Sister   . Brain cancer Father   . Emphysema Mother     Social Hx Social History   Tobacco Use  . Smoking status: Never Smoker  . Smokeless tobacco: Never Used  Substance Use Topics  . Alcohol use: No  . Drug use: No     Review of Systems  Constitutional: Negative for fever. Negative for chills. Eyes: Negative for visual changes. ENT: Negative for sore throat. Cardiovascular: Negative for chest pain. Respiratory: Negative for shortness of breath. Gastrointestinal: Negative for nausea. Negative for vomiting.  Genitourinary: + intermittent colicky right flank pain, hematuria Musculoskeletal: Negative for leg swelling. Skin:  Negative for rash. Neurological: Negative for headaches.   Physical Exam  Vital Signs: ED Triage Vitals [05/02/20 1118]  Enc Vitals Group     BP (!) 146/76     Pulse Rate 86     Resp (!) 22     Temp 97.6 F (36.4 C)     Temp Source Oral     SpO2 100 %     Weight 168 lb (76.2 kg)     Height 5\' 4"  (1.626 m)     Head Circumference      Peak Flow      Pain Score 4     Pain Loc      Pain Edu?      Excl. in [place]?     Constitutional: Alert and oriented. Well appearing. NAD.  Head: Normocephalic. Atraumatic. Eyes: Conjunctivae clear. Sclera anicteric. Pupils equal and symmetric. Nose: No masses or lesions. No congestion or rhinorrhea. Mouth/Throat: Wearing mask.  Neck: No stridor. Trachea midline.  Cardiovascular: Normal rate, regular rhythm. Extremities well perfused. Respiratory: Normal respiratory effort.  Lungs CTAB. Gastrointestinal: Soft. Non-distended. Non-tender.  Mild R CVA tenderness Genitourinary: Deferred. Musculoskeletal: No lower extremity edema. No deformities. Neurologic:  Normal  speech and language. No gross focal or lateralizing neurologic deficits are appreciated.  Skin: Skin is warm, dry and intact. No rash noted. Psychiatric: Mood and affect are appropriate for situation.    Radiology  Personally reviewed available imaging myself.   CT Renal - IMPRESSION:  1. 3 mm calculus proximal right ureter at the L3 level. While no  hydronephrosis is convincingly seen, the degree of cystic change  occupying the right renal pelvis could mask a degree of  hydronephrosis. There is no proximal ureterectasis.  2. There are nonobstructing calculi in each kidney, more on the left  than on the right.  3. Widespread cystic change in each kidney, particular in the renal  sinus/parapelvic regions. This finding was also present on prior  study.  4. Mild vaginal and rectal prolapse.  5. No bowel obstruction. No abscess in the abdomen or pelvis. No  appendiceal region  inflammation.  6. Aortic Atherosclerosis (ICD10-I70.0). There is also iliac artery  atherosclerosis.  7. Focal hiatal hernia present.    Procedures  Procedure(s) performed (including critical care):  Procedures   Initial Impression / Assessment and Plan / MDM / ED Course  69 y.o. female who presents to the ED for intermittent, colicky right-sided flank pain, radiating to the groin.  Similar to prior episodes of nephrolithiasis.  Ddx: recurrent nephroureterolithiasis, UTI, pyelonephritis, MSK  Will plan for labs, urine studies, imaging, pain control and reassess  Clinical Course as of May 03 1451  Tue May 02, 2020  1420 CT reveals a 3 mm stone on R.  UA suggestive of stone with RBCs and Hgb.  Mild whites, but LE/nitrite/bacteria negative.  Do not suspect concurrent infection.  Normal creatinine.  As such, feel patient is stable for discharge with urology follow-up and Rx for pain and nausea control.  She is comfortable with this plan.  Given return precautions.   [SM]    Clinical Course User Index [SM] Lilia Pro., MD     _______________________________   As part of my medical decision making I have reviewed available labs, radiology tests, reviewed old records/performed chart review.    Final Clinical Impression(s) / ED Diagnosis  Final diagnoses:  Right flank pain  Nephrolithiasis       Note:  This document was prepared using Dragon voice recognition software and may include unintentional dictation errors.   Lilia Pro., MD 05/02/20 1452

## 2020-05-02 NOTE — ED Triage Notes (Signed)
Pt presents to ED via POV with c/o R flank pain that started this morning, pt states pain 4.5/10 at this time with associated nausea. Pt states took extra strength tylenol this morning at approx 0835, and took expired Phenergan at approx 1020 this morning with some relief. Pt states hx of same and gets kidney stones every January.

## 2020-05-02 NOTE — ED Notes (Signed)
Pt signed paper discharge form

## 2020-05-03 ENCOUNTER — Other Ambulatory Visit: Payer: Self-pay | Admitting: Urology

## 2020-05-03 DIAGNOSIS — R310 Gross hematuria: Secondary | ICD-10-CM

## 2020-05-04 ENCOUNTER — Other Ambulatory Visit: Payer: Self-pay | Admitting: Family Medicine

## 2020-05-04 ENCOUNTER — Ambulatory Visit
Admission: RE | Admit: 2020-05-04 | Discharge: 2020-05-04 | Disposition: A | Discharge status: Home / Self Care | Payer: Medicare Other | Source: Ambulatory Visit | Attending: Urology | Admitting: Urology

## 2020-05-04 ENCOUNTER — Ambulatory Visit (INDEPENDENT_AMBULATORY_CARE_PROVIDER_SITE_OTHER): Payer: Medicare Other | Admitting: Urology

## 2020-05-04 ENCOUNTER — Encounter: Payer: Self-pay | Admitting: Urology

## 2020-05-04 ENCOUNTER — Other Ambulatory Visit: Payer: Self-pay

## 2020-05-04 VITALS — BP 116/75 | HR 88 | Ht 60.0 in | Wt 168.0 lb

## 2020-05-04 DIAGNOSIS — N201 Calculus of ureter: Secondary | ICD-10-CM

## 2020-05-04 DIAGNOSIS — R310 Gross hematuria: Secondary | ICD-10-CM

## 2020-05-04 MED ORDER — TAMSULOSIN HCL 0.4 MG PO CAPS: 0.4000 mg | ORAL_CAPSULE | Freq: Every day | ORAL | 0 refills | Status: DC

## 2020-05-04 NOTE — Progress Notes (Signed)
05/04/2020 3:20 PM   SAMANDA BUSKE 1951-06-17 [id number]  Referring provider: [name], MD [facility] [place],  [place] [address]  Chief Complaint  Patient presents with  . Nephrolithiasis    HPI: Patient is a 69 year old female who with nephrolithiasis who presents today for follow up after being seen in the ED with her husband, Pilar Plate.    She presented to the emergency room on May 02, 2020 with a complaint of the sudden onset of right-sided flank pain which radiated to her groin.  She had associated gross hematuria and nausea with the pain.  CT on 05/02/2020 noted 3 mm calculus proximal right ureter at the L3 level. While no hydronephrosis is convincingly seen, the degree of cystic change occupying the right renal pelvis could mask a degree of  hydronephrosis. There is no proximal ureterectasis.  There are nonobstructing calculi in each kidney, more on the left than on the right.   Labs in the ED: UA positive for greater than 500 glucose, 20 ketones, greater than 50 RBCs, 11-20 WBCs and 6-10 squamous epithelial cells.  Serum creatinine was 0.93.  WBC count 9.0.     Meds given in the ED: Oxycodone IR 74m, Zofran 4 mg prn and ibuprofen 600 mg prn  Prior urological history:  URS for left proximal ureteral stone on 03/17/2015 with Dr. WJeffie Pollock  Current NSAID/anticoagulation: Ibuprofen   Today, she states that she was feeling well until she went into the shower.  When she coughed she felt the sudden onset of right-sided flank pain.  She took an ibuprofen which provided no relief.  She then took OxyContin and the pain abated.  She is drinking 10-12 8 ounce bottles of water and effort to try to pass the stone spontaneously.  Patient denies any modifying or aggravating factors.  Patient denies any gross hematuria, dysuria or suprapubic/flank pain.  Patient denies any fevers, chills, nausea or vomiting.   UA today with 11-30 WBCs, greater than 30  RBCs, greater than 10 epithelial cells and few bacteria.  KUB 05/02/2020 calcifications noted below the sacrum on the right, may represent migrating stone.     PMH: Past Medical History:  Diagnosis Date  . Asthma   . Bilateral kidney stones   . COPD (chronic obstructive pulmonary disease) ([place]   . Diabetes mellitus   . Generalized convulsive epilepsy ([place]   . 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 Hematuria, gross   . Hydronephrosis   . Hypercholesterolemia   . Hypertension   . Hypothyroidism   . Nephrolithiasis   . Nephrolithiasis   . Seizures ([place]    last seizure 7-8 yrs ago  . Shortness of breath dyspnea    with exertion    Surgical History: Past Surgical History:  Procedure Laterality Date  . Bladder tack    . COLONOSCOPY    . COLONOSCOPY WITH PROPOFOL N/A 03/25/2018   Procedure: COLONOSCOPY WITH PROPOFOL;  Surgeon: Toledo, TBenay Pike MD;  Location: [facility] ENDOSCOPY;  Service: Gastroenterology;  Laterality: N/A;  . COLONOSCOPY WITH PROPOFOL N/A 05/13/2018   Procedure: COLONOSCOPY WITH PROPOFOL;  Surgeon: Toledo, TBenay Pike MD;  Location: [facility] ENDOSCOPY;  Service: Gastroenterology;  Laterality: N/A;  . CYSTOSCOPY W/ URETERAL STENT PLACEMENT  03/11/15  . CYSTOSCOPY WITH RETROGRADE PYELOGRAM, URETEROSCOPY AND STENT PLACEMENT Left 03/17/2015   Procedure: CYSTOSCOPY, STENT REMOVAL, LEFT URETEROSCOPY  WITH STONE REMOVAL WITH BASKET;  Surgeon: JIrine Seal MD;  Location[facility];  Service: Urology;  Laterality: Left;  . SHOULDER ARTHROSCOPY DISTAL CLAVICLE EXCISION AND OPEN ROTATOR CUFF REPAIR    . TUBAL LIGATION      Home Medications:  Allergies as of 05/04/2020      Reactions   Dilantin [phenytoin Sodium Extended] Swelling   Phenytoin Swelling      Medication List       Accurate as of May 04, 2020  3:20 PM. If you have any questions, ask your nurse or doctor.        albuterol 108 (90 Base) MCG/ACT inhaler Commonly known as: VENTOLIN  HFA Inhale 1 puff into the lungs every 6 (six) hours as needed for wheezing or shortness of breath.   atorvastatin 40 MG tablet Commonly known as: LIPITOR Take 40 mg by mouth daily.   canagliflozin 300 MG Tabs tablet Commonly known as: INVOKANA Take by mouth.   clotrimazole-betamethasone cream Commonly known as: LOTRISONE Apply 1 application topically 2 (two) times daily.   conjugated estrogens vaginal cream Commonly known as: Premarin Apply 0.29m (pea-sized amount)  just inside the vaginal introitus with a finger-tip on  Monday, Wednesday and Friday nights.   diclofenac sodium 1 % Gel Commonly known as: VOLTAREN Apply 4 g topically 4 (four) times daily.   divalproex 250 MG 24 hr tablet Commonly known as: DEPAKOTE ER Take 500-750 mg by mouth 2 (two) times daily. Take 5066min morning and take 7502mt night   estradiol 0.1 MG/GM vaginal cream Commonly known as: ESTRACE VAGINAL Apply 0.5mg43mea-sized amount)  just inside the vaginal introitus with a finger-tip on Monday, Wednesday and Friday nights.   fluticasone 50 MCG/ACT nasal spray Commonly known as: FLONASE Place 2 sprays into both nostrils daily as needed for allergies or rhinitis.   glimepiride 2 MG tablet Commonly known as: AMARYL TAKE 1 TABLET DAILY WITH  BREAKFAST   ibuprofen 600 MG tablet Commonly known as: ADVIL Take 1 tablet (600 mg total) by mouth every 6 (six) hours as needed.   losartan 100 MG tablet Commonly known as: COZAAR Take 50 mg by mouth every morning.   metFORMIN 500 MG 24 hr tablet Commonly known as: GLUCOPHAGE-XR Take 2,000 mg by mouth daily with supper.   ondansetron 4 MG tablet Commonly known as: ZOFRAN Take 1 tablet (4 mg total) by mouth every 8 (eight) hours as needed for up to 7 days for nausea or vomiting.   oxyCODONE 5 MG immediate release tablet Commonly known as: Roxicodone Take 1 tablet (5 mg total) by mouth every 6 (six) hours as needed for up to 3 days for moderate pain,  severe pain or breakthrough pain.   tamsulosin 0.4 MG Caps capsule Commonly known as: Flomax Take 1 capsule (0.4 mg total) by mouth daily.       Allergies:  Allergies  Allergen Reactions  . Dilantin [Phenytoin Sodium Extended] Swelling  . Phenytoin Swelling    Family History: Family History  Problem Relation Age of Onset  . Coronary artery disease Sister   . Heart disease Sister   . Brain cancer Father   . Emphysema Mother     Social History:  reports that she has never smoked. She has never used smokeless tobacco. She reports that she does not drink alcohol or use drugs.  ROS: Pertinent ROS in HPI  Physical Exam: BP 116/75   Pulse 88   Ht 5' (1.524 m)   Wt 168 lb (76.2 kg)   BMI 32.81 kg/m  Constitutional:  Well nourished. Alert and oriented, No acute distress. HEENT: [place] AT, mask in place  Trachea midline Cardiovascular: No clubbing, cyanosis, or edema. Respiratory: Normal respiratory effort, no increased work of breathing. Neurologic: Grossly intact, no focal deficits, moving all 4 extremities. Psychiatric: Normal mood and affect.  Laboratory Data: Lab Results  Component Value Date   WBC 9.0 05/02/2020   HGB 14.6 05/02/2020   HCT 44.0 05/02/2020   MCV 93.2 05/02/2020   PLT 201 05/02/2020    Lab Results  Component Value Date   CREATININE 0.93 05/02/2020     Lab Results  Component Value Date   TSH 2.184 04/04/2016    Lab Results  Component Value Date   AST 24 12/17/2019   Lab Results  Component Value Date   ALT 19 12/17/2019    Urinalysis Component     Latest Ref Rng & Units 05/04/2020  Specific Gravity, UA     1.005 - 1.030 1.010  pH, UA     5.0 - 7.5 5.5  Color, UA     Yellow Yellow  Appearance Ur     Clear Cloudy (A)  Leukocytes,UA     Negative 1+ (A)  Protein,UA     Negative/Trace Negative  Glucose, UA     Negative 2+ (A)  Ketones, UA     Negative 1+ (A)  RBC, UA     Negative 3+ (A)  Bilirubin, UA     Negative  Negative  Urobilinogen, Ur     0.2 - 1.0 mg/dL 0.2  Nitrite, UA     Negative Negative  Microscopic Examination      See below:   Component     Latest Ref Rng & Units 05/04/2020  WBC, UA     0 - 5 /hpf 11-30 (A)  RBC     0 - 2 /hpf >30 (A)  Epithelial Cells (non renal)     0 - 10 /hpf >10 (A)  Bacteria, UA     None seen/Few None seen     I have reviewed the labs.   Pertinent Imaging: CLINICAL DATA:  Right flank pain. Hematuria. History of kidney stones.  EXAM: ABDOMEN - 1 VIEW  COMPARISON:  CT 05/02/2020.  Abdomen 12/04/2016.  FINDINGS: Large amount of stool in the colon making evaluation for renal and ureteral stones difficult. Vascular calcifications again noted in the pelvis. Previously identified 3 mm right proximal ureteral stone may be present over the mid right pelvis. Degenerative changes lumbar spine. Tiny sclerotic focus in the left ilium again noted most consistent with bone island.  IMPRESSION: Large amount of stool the colon making evaluation for renal and ureteral stones difficult. Vascular calcifications again noted in the pelvis. Calcifications noted over the mid right pelvis may represent vascular calcifications. Previously identified 3 mm stone not identified proximally. The stone may be present in distal right ureter.   Electronically Signed   By: Marcello Moores  Register   On: 05/05/2020 06:35 I have independently reviewed the films.  See HPI.   Assessment & Plan:    1. Gross hematuria Likely secondary to ureteral stone We will continue to monitor - CULTURE, URINE COMPREHENSIVE  2. Right ureteral stone Suspect that this may be the same stone seen on January's CT renal stone study even though she states she feels she passed that stone as it is located in the same location on the CT renal stone study from the 21 th We will pursue MET for now but she  will return in 2 weeks, if she has not passed the stone or no significant progression is  captured on KUB will need to pursue ureteroscopy  Prescription given for tamsulosin 0.4 mg she is to take daily Patient is advised that if they should start to experience pain that is not able to be controlled with pain medication, intractable nausea and/or vomiting and/or fevers greater than 103 or shaking chills to contact the office immediately or seek treatment in the emergency department for emergent intervention.   - Urinalysis, Complete  Return in about 2 weeks (around 05/18/2020) for KUB prior .  These notes generated with voice recognition software. I apologize for typographical errors.  [name], PA-C  [facility] [address]  [place], [place] [address] [phone]  I spent 30 minutes on the day of the encounter to include pre-visit record review, face-to-face time with the patient, and post-visit ordering of tests.

## 2020-05-05 LAB — URINALYSIS, COMPLETE
Bilirubin, UA: NEGATIVE
Nitrite, UA: NEGATIVE
Protein,UA: NEGATIVE
Specific Gravity, UA: 1.01 (ref 1.005–1.030)
Urobilinogen, Ur: 0.2 mg/dL (ref 0.2–1.0)
pH, UA: 5.5 (ref 5.0–7.5)

## 2020-05-05 LAB — MICROSCOPIC EXAMINATION
Bacteria, UA: NONE SEEN
Epithelial Cells (non renal): >10 /hpf — AB (ref 0–10)
RBC, Urine: >30 /hpf — AB (ref 0–2)

## 2020-05-08 LAB — CULTURE, URINE COMPREHENSIVE

## 2020-05-11 ENCOUNTER — Other Ambulatory Visit: Payer: Self-pay | Admitting: Urology

## 2020-05-18 ENCOUNTER — Other Ambulatory Visit: Payer: Self-pay

## 2020-05-18 ENCOUNTER — Encounter: Payer: Self-pay | Admitting: Urology

## 2020-05-18 ENCOUNTER — Ambulatory Visit (INDEPENDENT_AMBULATORY_CARE_PROVIDER_SITE_OTHER): Payer: Medicare Other | Admitting: Urology

## 2020-05-18 ENCOUNTER — Ambulatory Visit
Admission: RE | Admit: 2020-05-18 | Discharge: 2020-05-18 | Disposition: A | Discharge status: Home / Self Care | Payer: Medicare Other | Source: Ambulatory Visit | Attending: Urology | Admitting: Urology

## 2020-05-18 VITALS — BP 143/83 | HR 105 | Ht 60.0 in | Wt 168.0 lb

## 2020-05-18 DIAGNOSIS — N201 Calculus of ureter: Secondary | ICD-10-CM

## 2020-05-18 DIAGNOSIS — R310 Gross hematuria: Secondary | ICD-10-CM | POA: Diagnosis not present

## 2020-05-18 NOTE — Progress Notes (Signed)
05/18/2020 2:23 PM   Crystal Foley [birth year] [id number]  Referring provider: [name], MD [facility] [place],  [place] [address]  Chief Complaint  Patient presents with  . Nephrolithiasis    HPI: Patient is a 69 year old female who with nephrolithiasis who presents today for follow up for a right ureteral proximal stone with her husband, Pilar Plate.    CT on 05/02/2020 noted 3 mm calculus proximal right ureter at the L3 level. While no hydronephrosis is convincingly seen, the degree of cystic change occupying the right renal pelvis could mask a degree of hydronephrosis. There is no proximal ureterectasis.  There are nonobstructing calculi in each kidney, more on the left than on the right.  KUB 05/02/2020 calcifications noted below the sacrum on the right, may represent migrating stone.   She passed a significant amount of stone burden last week.  Stone analysis was found to be 99% calcium oxalate monohydrate and 1% calcium phosphate carbonate.  KUB May 18, 2020 noted no calcifications.  Today, she feels well.  Patient denies any modifying or aggravating factors.  Patient denies any gross hematuria, dysuria or suprapubic/flank pain.  Patient denies any fevers, chills, nausea or vomiting.     PMH: Past Medical History:  Diagnosis Date  . Asthma   . Bilateral kidney stones   . COPD (chronic obstructive pulmonary disease) ([place])   . Diabetes mellitus   . Generalized convulsive epilepsy ([place])   . 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 Hematuria, gross   . Hydronephrosis   . Hypercholesterolemia   . Hypertension   . Hypothyroidism   . Nephrolithiasis   . Nephrolithiasis   . Seizures ([place])    last seizure 7-8 yrs ago  . Shortness of breath dyspnea    with exertion    Surgical History: Past Surgical History:  Procedure Laterality Date  . Bladder tack    . COLONOSCOPY    . COLONOSCOPY WITH  PROPOFOL N/A 03/25/2018   Procedure: COLONOSCOPY WITH PROPOFOL;  Surgeon: Toledo, Benay Pike, MD;  Location: [facility] ENDOSCOPY;  Service: Gastroenterology;  Laterality: N/A;  . COLONOSCOPY WITH PROPOFOL N/A 05/13/2018   Procedure: COLONOSCOPY WITH PROPOFOL;  Surgeon: Toledo, Benay Pike, MD;  Location: [facility] ENDOSCOPY;  Service: Gastroenterology;  Laterality: N/A;  . CYSTOSCOPY W/ URETERAL STENT PLACEMENT  03/11/15  . CYSTOSCOPY WITH RETROGRADE PYELOGRAM, URETEROSCOPY AND STENT PLACEMENT Left 03/17/2015   Procedure: CYSTOSCOPY, STENT REMOVAL, LEFT URETEROSCOPY  WITH STONE REMOVAL WITH BASKET;  Surgeon: Irine Seal, MD;  Location: [facility];  Service: Urology;  Laterality: Left;  . SHOULDER ARTHROSCOPY DISTAL CLAVICLE EXCISION AND OPEN ROTATOR CUFF REPAIR    . TUBAL LIGATION      Home Medications:  Allergies as of 05/18/2020      Reactions   Dilantin [phenytoin Sodium Extended] Swelling   Phenytoin Swelling      Medication List       Accurate as of May 18, 2020  2:23 PM. If you have any questions, ask your nurse or doctor.        albuterol 108 (90 Base) MCG/ACT inhaler Commonly known as: VENTOLIN HFA Inhale 1 puff into the lungs every 6 (six) hours as needed for wheezing or shortness of breath.   atorvastatin 40 MG tablet Commonly known as: LIPITOR Take 40 mg by mouth daily.   canagliflozin 300 MG Tabs tablet Commonly known as: INVOKANA Take by mouth.   clotrimazole-betamethasone cream Commonly known  as: LOTRISONE Apply 1 application topically 2 (two) times daily.   conjugated estrogens vaginal cream Commonly known as: Premarin Apply 0.5mg  (pea-sized amount)  just inside the vaginal introitus with a finger-tip on  Monday, Wednesday and Friday nights.   diclofenac sodium 1 % Gel Commonly known as: VOLTAREN Apply 4 g topically 4 (four) times daily.   divalproex 250 MG 24 hr tablet Commonly known as: DEPAKOTE ER Take 500-750 mg by mouth 2 (two) times daily. Take 500mg  in morning and take  750mg  at night   estradiol 0.1 MG/GM vaginal cream Commonly known as: ESTRACE VAGINAL Apply 0.5mg  (pea-sized amount)  just inside the vaginal introitus with a finger-tip on Monday, Wednesday and Friday nights.   fluticasone 50 MCG/ACT nasal spray Commonly known as: FLONASE Place 2 sprays into both nostrils daily as needed for allergies or rhinitis.   glimepiride 2 MG tablet Commonly known as: AMARYL TAKE 1 TABLET DAILY WITH  BREAKFAST   ibuprofen 600 MG tablet Commonly known as: ADVIL Take 1 tablet (600 mg total) by mouth every 6 (six) hours as needed.   losartan 100 MG tablet Commonly known as: COZAAR Take 50 mg by mouth every morning.   metFORMIN 500 MG 24 hr tablet Commonly known as: GLUCOPHAGE-XR Take 2,000 mg by mouth daily with supper.   tamsulosin 0.4 MG Caps capsule Commonly known as: Flomax Take 1 capsule (0.4 mg total) by mouth daily.       Allergies:  Allergies  Allergen Reactions  . Dilantin [Phenytoin Sodium Extended] Swelling  . Phenytoin Swelling    Family History: Family History  Problem Relation Age of Onset  . Coronary artery disease Sister   . Heart disease Sister   . Brain cancer Father   . Emphysema Mother     Social History:  reports that she has never smoked. She has never used smokeless tobacco. She reports that she does not drink alcohol or use drugs.  ROS: Pertinent ROS in HPI  Physical Exam: BP (!) 143/83   Pulse (!) 105   Ht 5' (1.524 m)   Wt 168 lb (76.2 kg)   BMI 32.81 kg/m   Constitutional:  Well nourished. Alert and oriented, No acute distress. HEENT: [place] AT, moist mucus membranes.  Trachea midline, no masses. Cardiovascular: No clubbing, cyanosis, or edema. Respiratory: Normal respiratory effort, no increased work of breathing. Neurologic: Grossly intact, no focal deficits, moving all 4 extremities. Psychiatric: Normal mood and affect.   Laboratory Data: Lab Results  Component Value Date   WBC 9.0 05/02/2020   HGB  14.6 05/02/2020   HCT 44.0 05/02/2020   MCV 93.2 05/02/2020   PLT 201 05/02/2020    Lab Results  Component Value Date   CREATININE 0.93 05/02/2020     Lab Results  Component Value Date   TSH 2.184 04/04/2016    Lab Results  Component Value Date   AST 24 12/17/2019   Lab Results  Component Value Date   ALT 19 12/17/2019    Urinalysis Component     Latest Ref Rng & Units 05/04/2020  Specific Gravity, UA     1.005 - 1.030 1.010  pH, UA     5.0 - 7.5 5.5  Color, UA     Yellow Yellow  Appearance Ur     Clear Cloudy (A)  Leukocytes,UA     Negative 1+ (A)  Protein,UA     Negative/Trace Negative  Glucose, UA     Negative 2+ (A)  Ketones, UA  Negative 1+ (A)  RBC, UA     Negative 3+ (A)  Bilirubin, UA     Negative Negative  Urobilinogen, Ur     0.2 - 1.0 mg/dL 0.2  Nitrite, UA     Negative Negative  Microscopic Examination      See below:   Component     Latest Ref Rng & Units 05/04/2020  WBC, UA     0 - 5 /hpf 11-30 (A)  RBC     0 - 2 /hpf >30 (A)  Epithelial Cells (non renal)     0 - 10 /hpf >10 (A)  Bacteria, UA     None seen/Few None seen     I have reviewed the labs.   Pertinent Imaging: CLINICAL DATA:  History of right-sided kidney stones  EXAM: ABDOMEN - 1 VIEW  COMPARISON:  05/04/2020  FINDINGS: Scattered large and small bowel gas is noted. No renal calculi are noted. Phleboliths are noted. No definitive ureteral stone is seen. No bony abnormality noted.  IMPRESSION: No definitive renal stones are seen.   Electronically Signed   By: Inez Catalina M.D.   On: 05/18/2020 23:46 I have independently reviewed the films.  See HPI.   Assessment & Plan:    1. Gross hematuria - Urinalysis, Complete Likely secondary to ureteral stone We will continue to monitor Resolved with the passage of stones, but she was unable to give a UA for microscopic examination She return next week for UA to check for microscopic heme  2.  Right ureteral stone -Past We will obtain renal ultrasound to ensure hydronephrosis has resolved I will contact patient with results  Return in about 1 week (around 05/25/2020) for Recheck UA for microscopic hematuria.  These notes generated with voice recognition software. I apologize for typographical errors.  [name], PA-C  [facility] [address]  [place], [place] [address] [phone]

## 2020-05-22 ENCOUNTER — Other Ambulatory Visit: Payer: Self-pay | Admitting: Family Medicine

## 2020-05-22 ENCOUNTER — Other Ambulatory Visit: Payer: Medicare Other

## 2020-05-22 ENCOUNTER — Other Ambulatory Visit: Payer: Self-pay

## 2020-05-22 ENCOUNTER — Telehealth: Payer: Self-pay | Admitting: Family Medicine

## 2020-05-22 DIAGNOSIS — R310 Gross hematuria: Secondary | ICD-10-CM

## 2020-05-22 LAB — MICROSCOPIC EXAMINATION
Bacteria, UA: NONE SEEN
RBC, Urine: NONE SEEN /hpf (ref 0–2)

## 2020-05-22 LAB — URINALYSIS, COMPLETE
Bilirubin, UA: NEGATIVE
Leukocytes,UA: NEGATIVE
Nitrite, UA: NEGATIVE
Protein,UA: NEGATIVE
RBC, UA: NEGATIVE
Specific Gravity, UA: 1.025 (ref 1.005–1.030)
Urobilinogen, Ur: 0.2 mg/dL (ref 0.2–1.0)
pH, UA: 5.5 (ref 5.0–7.5)

## 2020-05-22 NOTE — Telephone Encounter (Signed)
-----   Message from Nori Riis, PA-C sent at 05/22/2020  2:01 PM EDT ----- Please let Crystal Foley know that her urine was clear of micro heme.

## 2020-05-22 NOTE — Telephone Encounter (Signed)
Patient notified and voiced understanding.

## 2020-05-26 ENCOUNTER — Other Ambulatory Visit: Payer: Self-pay | Admitting: Urology

## 2020-05-26 DIAGNOSIS — N201 Calculus of ureter: Secondary | ICD-10-CM

## 2020-08-10 ENCOUNTER — Other Ambulatory Visit: Payer: Self-pay

## 2020-08-10 ENCOUNTER — Encounter: Payer: Self-pay | Admitting: Podiatry

## 2020-08-10 ENCOUNTER — Ambulatory Visit (INDEPENDENT_AMBULATORY_CARE_PROVIDER_SITE_OTHER): Payer: Medicare Other | Admitting: Podiatry

## 2020-08-10 DIAGNOSIS — B351 Tinea unguium: Secondary | ICD-10-CM | POA: Diagnosis not present

## 2020-08-10 DIAGNOSIS — E119 Type 2 diabetes mellitus without complications: Secondary | ICD-10-CM | POA: Diagnosis not present

## 2020-08-10 DIAGNOSIS — M79676 Pain in unspecified toe(s): Secondary | ICD-10-CM

## 2020-08-10 NOTE — Progress Notes (Signed)

## 2020-10-25 ENCOUNTER — Ambulatory Visit
Admission: RE | Admit: 2020-10-25 | Discharge: 2020-10-25 | Disposition: A | Discharge status: Home / Self Care | Payer: Medicare Other | Source: Ambulatory Visit | Attending: Internal Medicine | Admitting: Internal Medicine

## 2020-10-25 ENCOUNTER — Other Ambulatory Visit: Payer: Self-pay

## 2020-10-25 DIAGNOSIS — Z1231 Encounter for screening mammogram for malignant neoplasm of breast: Secondary | ICD-10-CM | POA: Insufficient documentation

## 2020-11-02 ENCOUNTER — Emergency Department
Admission: EM | Admit: 2020-11-02 | Discharge: 2020-11-02 | Disposition: A | Discharge status: Home / Self Care | Payer: Medicare Other | Attending: Emergency Medicine | Admitting: Emergency Medicine

## 2020-11-02 ENCOUNTER — Other Ambulatory Visit: Payer: Self-pay

## 2020-11-02 ENCOUNTER — Emergency Department: Payer: Medicare Other

## 2020-11-02 DIAGNOSIS — J45909 Unspecified asthma, uncomplicated: Secondary | ICD-10-CM | POA: Insufficient documentation

## 2020-11-02 DIAGNOSIS — R000 Tachycardia, unspecified: Secondary | ICD-10-CM | POA: Diagnosis present

## 2020-11-02 DIAGNOSIS — J449 Chronic obstructive pulmonary disease, unspecified: Secondary | ICD-10-CM | POA: Insufficient documentation

## 2020-11-02 DIAGNOSIS — E039 Hypothyroidism, unspecified: Secondary | ICD-10-CM | POA: Insufficient documentation

## 2020-11-02 DIAGNOSIS — E1122 Type 2 diabetes mellitus with diabetic chronic kidney disease: Secondary | ICD-10-CM | POA: Diagnosis not present

## 2020-11-02 DIAGNOSIS — E119 Type 2 diabetes mellitus without complications: Secondary | ICD-10-CM | POA: Insufficient documentation

## 2020-11-02 DIAGNOSIS — Z794 Long term (current) use of insulin: Secondary | ICD-10-CM | POA: Diagnosis not present

## 2020-11-02 DIAGNOSIS — N182 Chronic kidney disease, stage 2 (mild): Secondary | ICD-10-CM | POA: Diagnosis not present

## 2020-11-02 DIAGNOSIS — E876 Hypokalemia: Secondary | ICD-10-CM | POA: Insufficient documentation

## 2020-11-02 DIAGNOSIS — Z79899 Other long term (current) drug therapy: Secondary | ICD-10-CM | POA: Diagnosis not present

## 2020-11-02 DIAGNOSIS — I129 Hypertensive chronic kidney disease with stage 1 through stage 4 chronic kidney disease, or unspecified chronic kidney disease: Secondary | ICD-10-CM | POA: Insufficient documentation

## 2020-11-02 LAB — BASIC METABOLIC PANEL
Anion gap: 17 — ABNORMAL HIGH (ref 5–15)
BUN: 10 mg/dL (ref 8–23)
CO2: 19 mmol/L — ABNORMAL LOW (ref 22–32)
Calcium: 8.9 mg/dL (ref 8.9–10.3)
Chloride: 97 mmol/L — ABNORMAL LOW (ref 98–111)
Creatinine, Ser: 0.68 mg/dL (ref 0.44–1.00)
GFR, Estimated: >60 mL/min (ref >60)
Glucose, Bld: 296 mg/dL — ABNORMAL HIGH (ref 70–99)
Potassium: 3.1 mmol/L — ABNORMAL LOW (ref 3.5–5.1)
Sodium: 133 mmol/L — ABNORMAL LOW (ref 135–145)

## 2020-11-02 LAB — CBC
HCT: 45.5 % (ref 36.0–46.0)
Hemoglobin: 15.5 g/dL — ABNORMAL HIGH (ref 12.0–15.0)
MCH: 31.4 pg (ref 26.0–34.0)
MCHC: 34.1 g/dL (ref 30.0–36.0)
MCV: 92.3 fL (ref 80.0–100.0)
Platelets: 202 10*3/uL (ref 150–400)
RBC: 4.93 MIL/uL (ref 3.87–5.11)
RDW: 12.1 % (ref 11.5–15.5)
WBC: 8.1 10*3/uL (ref 4.0–10.5)
nRBC: 0 % (ref 0.0–0.2)

## 2020-11-02 LAB — TROPONIN I (HIGH SENSITIVITY)
Troponin I (High Sensitivity): 6 ng/L (ref <18)
Troponin I (High Sensitivity): 10 ng/L (ref <18)

## 2020-11-02 LAB — FIBRIN DERIVATIVES D-DIMER (ARMC ONLY): Fibrin derivatives D-dimer (ARMC): 441.24 ng/mL (FEU) (ref 0.00–499.00)

## 2020-11-02 MED ORDER — SODIUM CHLORIDE 0.9 % IV BOLUS
500.0000 mL | Freq: Once | INTRAVENOUS | Status: AC
  Administered 2020-11-02: 500 mL via INTRAVENOUS

## 2020-11-02 MED ORDER — POTASSIUM CHLORIDE CRYS ER 20 MEQ PO TBCR
20.0000 meq | EXTENDED_RELEASE_TABLET | Freq: Every day | ORAL | 0 refills | Status: DC
Start: 2020-11-02 — End: 2021-03-22

## 2020-11-02 MED ORDER — POTASSIUM CHLORIDE CRYS ER 20 MEQ PO TBCR
40.0000 meq | EXTENDED_RELEASE_TABLET | Freq: Once | ORAL | Status: AC
  Administered 2020-11-02: 40 meq via ORAL
  Filled 2020-11-02: qty 2

## 2020-11-02 NOTE — ED Triage Notes (Signed)
Patient coming [facility] from home. Patient go up to use the restroom, began to fell panicky. Patient's pressure over 734 systolic. Patient's original HR 150s for EMS, now 120s. Patient diabetic, CBG 210.

## 2020-11-02 NOTE — ED Provider Notes (Signed)
[facility] Emergency Department Provider Note  ____________________________________________   First MD Initiated Contact with Patient 11/02/20 0215     (approximate)  I have reviewed the triage vital signs and the nursing notes.   HISTORY  Chief Complaint Tachycardia    HPI Crystal Foley is a 69 y.o. female who presents for evaluation of an episode that occurred when she got up to go the bathroom during the night.  She reports that she started to feel shaky with some palpitations.  She denies chest pain or shortness of breath.  Both of her hands were shaking enough that she could barely drink a cup of water when she asked her husband for it.  She has never had this sort of thing happened in the past.  She did not feel like she was going to pass out and did not lose consciousness.  She has no visual changes or headache.  She had no focal weakness in any of her extremities.  She has a became concerned about her symptoms and they called for help (the couple lives at [facility]).  EMS said that her blood pressure was high and her heart rate was high and she was transferred to the emergency department.  She currently feels fine although her heart rate is still a little bit elevated.  She denies recent fever/chills, sore throat, chest pain, shortness of breath, nausea, vomiting, and abdominal pain.  She has no new recent medications.  Nothing in particular made her symptoms feel better or feel worse but they were acute in onset and severe.         Past Medical History:  Diagnosis Date  . Asthma   . Bilateral kidney stones   . COPD (chronic obstructive pulmonary disease) ([place])   . Diabetes mellitus   . Generalized convulsive epilepsy ([place])   . 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 Hematuria, gross   . Hydronephrosis   . Hypercholesterolemia   . Hypertension   . Hypothyroidism   . Nephrolithiasis   . Nephrolithiasis   .  Seizures ([place])    last seizure 7-8 yrs ago  . Shortness of breath dyspnea    with exertion    Patient Active Problem List   Diagnosis Date Noted  . Aortic atherosclerosis ([place]) 12/29/2018  . Community acquired pneumonia 12/24/2018  . Sepsis ([place]) 12/23/2018  . Health care maintenance 09/19/2015  . Calculus of kidney 06/01/2015  . Hypercholesterolemia without hypertriglyceridemia 09/27/2014  . Pure hypercholesterolemia 09/27/2014  . Adult BMI 30+ 09/25/2014  . Morbid obesity ([place]) 09/25/2014  . Severe obesity (BMI 35.0-35.9 with comorbidity) ([place]) 09/25/2014  . Asthma 05/15/2014  . Benign hypertension 05/15/2014  . HLD (hyperlipidemia) 05/15/2014  . Seizure ([place]) 05/15/2014  . Diabetes mellitus, type 2 ([place]) 05/15/2014  . 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[place]) 05/15/2014    Past Surgical History:  Procedure Laterality Date  . Bladder tack    . COLONOSCOPY    . COLONOSCOPY WITH PROPOFOL N/A 03/25/2018   Procedure: COLONOSCOPY WITH PROPOFOL;  Surgeon: Toledo, Benay Pike, MD;  Location: [facility];  Service: Gastroenterology;  Laterality: N/A;  . COLONOSCOPY WITH PROPOFOL N/A 05/13/2018   Procedure: COLONOSCOPY WITH PROPOFOL;  Surgeon: Toledo, Benay Pike, MD;  Location: [facility];  Service: Gastroenterology;  Laterality: N/A;  . CYSTOSCOPY W/ URETERAL STENT PLACEMENT  03/11/15  . CYSTOSCOPY WITH RETROGRADE PYELOGRAM, URETEROSCOPY AND STENT PLACEMENT Left 03/17/2015   Procedure: CYSTOSCOPY,  STENT REMOVAL, LEFT URETEROSCOPY  WITH STONE REMOVAL WITH BASKET;  Surgeon: Irine Seal, MD;  Location: [facility];  Service: Urology;  Laterality: Left;  . SHOULDER ARTHROSCOPY DISTAL CLAVICLE EXCISION AND OPEN ROTATOR CUFF REPAIR    . TUBAL LIGATION      Prior to Admission medications   Medication Sig Start Date End Date Taking? Authorizing Provider  albuterol (PROVENTIL HFA;VENTOLIN HFA) 108 (90 BASE) MCG/ACT inhaler Inhale 1 puff into the lungs every 6 (six) hours  as needed for wheezing or shortness of breath.    [provider]  atorvastatin (LIPITOR) 40 MG tablet Take 40 mg by mouth daily.    [provider]  canagliflozin (INVOKANA) 300 MG TABS tablet Take by mouth. 03/01/19   [provider]  clotrimazole-betamethasone (LOTRISONE) cream Apply 1 application topically 2 (two) times daily. 12/29/19   [name] A, PA-C  conjugated estrogens (PREMARIN) vaginal cream Apply 0.5mg  (pea-sized amount)  just inside the vaginal introitus with a finger-tip on  Monday, Wednesday and Friday nights. 02/17/19   [name] A, PA-C  diclofenac sodium (VOLTAREN) 1 % GEL Apply 4 g topically 4 (four) times daily.    [provider]  divalproex (DEPAKOTE ER) 250 MG 24 hr tablet Take 500-750 mg by mouth 2 (two) times daily. Take 500mg  in morning and take 750mg  at night    [provider]  estradiol (ESTRACE VAGINAL) 0.1 MG/GM vaginal cream Apply 0.5mg  (pea-sized amount)  just inside the vaginal introitus with a finger-tip on Monday, Wednesday and Friday nights. 02/17/19   McGowan, [name] A, PA-C  fluticasone (FLONASE) 50 MCG/ACT nasal spray Place 2 sprays into both nostrils daily as needed for allergies or rhinitis.    [provider]  glimepiride (AMARYL) 2 MG tablet TAKE 1 TABLET DAILY WITH  BREAKFAST 03/20/20   [provider]  losartan (COZAAR) 100 MG tablet Take by mouth. 06/20/20 06/20/21  [provider]  metFORMIN (GLUCOPHAGE-XR) 500 MG 24 hr tablet Take 2,000 mg by mouth daily with supper.     [provider]  potassium chloride SA (KLOR-CON M20) 20 MEQ tablet Take 1 tablet (20 mEq total) by mouth daily. 11/02/20   Hinda Kehr, MD  RYBELSUS 7 MG TABS Take 1 tablet by mouth daily. 06/21/20   [provider]  tamsulosin (FLOMAX) 0.4 MG CAPS capsule TAKE 1 CAPSULE BY MOUTH EVERY DAY 05/26/20   McGowan, [name] A, PA-C    Allergies Dilantin [phenytoin sodium extended] and  Phenytoin  Family History  Problem Relation Age of Onset  . Coronary artery disease Sister   . Heart disease Sister   . Brain cancer Father   . Emphysema Mother     Social History Social History   Tobacco Use  . Smoking status: Never Smoker  . Smokeless tobacco: Never Used  Vaping Use  . Vaping Use: Never used  Substance Use Topics  . Alcohol use: No  . Drug use: No    Review of Systems Constitutional: No fever/chills Eyes: No visual changes. ENT: No sore throat. Cardiovascular: Rapid heart rate and elevated blood pressure. Respiratory: Denies shortness of breath. Gastrointestinal: No abdominal pain.  No nausea, no vomiting.  No diarrhea.  No constipation. Genitourinary: Negative for dysuria. Musculoskeletal: Negative for neck pain.  Negative for back pain. Integumentary: Negative for rash. Neurological: Bilateral hand tremor during her "episode".  Negative for headaches, focal weakness or numbness.   ____________________________________________   PHYSICAL EXAM:  VITAL SIGNS: ED Triage Vitals  Enc  Vitals Group     BP 11/02/20 0052 (!) 193/82     Pulse Rate 11/02/20 0052 (!) 141     Resp 11/02/20 0052 (!) 28     Temp 11/02/20 0052 98.2 F (36.8 C)     Temp src --      SpO2 11/02/20 0052 100 %     Weight 11/02/20 0053 79.8 kg (176 lb)     Height 11/02/20 0053 1.626 m (5\' 4" )     Head Circumference --      Peak Flow --      Pain Score 11/02/20 0053 0     Pain Loc --      Pain Edu? --      Excl. in [place]? --     Constitutional: Alert and oriented.  Eyes: Conjunctivae are normal.  Head: Atraumatic. Nose: No congestion/rhinnorhea. Mouth/Throat: Patient is wearing a mask. Neck: No stridor.  No meningeal signs.   Cardiovascular: Tachycardia, regular rhythm. Good peripheral circulation. Grossly normal heart sounds. Respiratory: Normal respiratory effort.  No retractions. Gastrointestinal: Soft and nontender. No distention.  Musculoskeletal: No lower extremity  tenderness nor edema. No gross deformities of extremities. Neurologic:  Normal speech and language. No gross focal neurologic deficits are appreciated.  Skin:  Skin is warm, dry and intact. Psychiatric: Mood and affect are normal. Speech and behavior are normal.  ____________________________________________   LABS (all labs ordered are listed, but only abnormal results are displayed)  Labs Reviewed  BASIC METABOLIC PANEL - Abnormal; Notable for the following components:      Result Value   Sodium 133 (*)    Potassium 3.1 (*)    Chloride 97 (*)    CO2 19 (*)    Glucose, Bld 296 (*)    Anion gap 17 (*)    All other components within normal limits  CBC - Abnormal; Notable for the following components:   Hemoglobin 15.5 (*)    All other components within normal limits  FIBRIN DERIVATIVES D-DIMER ([facility] ONLY)  TROPONIN I (HIGH SENSITIVITY)  TROPONIN I (HIGH SENSITIVITY)   ____________________________________________  EKG  ED ECG REPORT I, Hinda Kehr, the attending physician, personally viewed and interpreted this ECG.  Date: 11/02/2020 EKG Time: 00: 55 Rate: 138 Rhythm: Sinus tachycardia QRS Axis: normal Intervals: normal ST/T Wave abnormalities: Non-specific ST segment / T-wave changes, but no clear evidence of acute ischemia. Narrative Interpretation: no definitive evidence of acute ischemia; does not meet STEMI criteria.   ____________________________________________  RADIOLOGY I, Hinda Kehr, personally viewed and evaluated these images (plain radiographs) as part of my medical decision making, as well as reviewing the written report by the radiologist.  ED MD interpretation: No acute abnormality identified on chest x-ray  Official radiology report(s): No results found.  ____________________________________________   PROCEDURES   Procedure(s) performed (including Critical Care):  .1-3 Lead EKG Interpretation Performed by: Hinda Kehr, MD Authorized  by: Hinda Kehr, MD     Interpretation: abnormal     ECG rate:  108   ECG rate assessment: tachycardic     Rhythm: sinus tachycardia     Ectopy: none     Conduction: normal       ____________________________________________   INITIAL IMPRESSION / MDM / ASSESSMENT AND PLAN / ED COURSE  As part of my medical decision making, I reviewed the following data within the [place] History obtained from family, Nursing notes reviewed and incorporated, Labs reviewed , EKG interpreted , Old chart reviewed, Radiograph reviewed  and Notes from prior ED visits   Differential diagnosis includes, but is not limited to, cardiac arrhythmia, ACS, CVA, PE, acute infection  The patient is on the cardiac monitor to evaluate for evidence of arrhythmia and/or significant heart rate changes.  Other than her heart rate, vital signs are stable now even though she was hypertensive initially, but the heart rate of between about 110 at 120 is somewhat concerning and inexplicable.  She is in sinus tachycardia, not SVT or A. fib/a flutter.  She has had no chest pain.  She claims to have been eating and drinking recently and does not feel dehydrated and she is afebrile and has not been feeling ill.  Initial high-sensitivity troponin was 6, basic metabolic panel is generally reassuring other than some mild hypokalemia and elevated glucose.  Her anion gap is 17 but this is barely higher than the upper limit of normal and I do not feel that this represents DKA or other severe emergent condition.  CBC is normal.  I personally reviewed the patient's imaging and agree with the radiologist's interpretation that there is no evidence of acute abnormality on chest x-ray.  EKG shows no signs of ischemia.  I had an extensive conversation with the patient and her husband.  She feels well and actually wants to go home, but I explained that with persistent unexplained tachycardia we should at least monitor to make  sure she is okay.  There is no indication for emergent imaging of her head.  We decided to place an IV and give a small fluid bolus of 500 mL of normal saline.  Will check a repeat troponin and continue to monitor her to see if she has any additional episodes or if her tachycardia improves.  PE is unlikely as she has no risk factors, but it could explain the odd sensation she experienced as well as her persistent tachycardia.  We had a long discussion about this as well and because she has a low risk, we had a risk/benefit discussion about her D-dimer and decided to go ahead and check it.  If her age adjusted D-dimer is within normal limits we will not proceed with a CTA chest, but if her D-dimer is greater than 690, we will proceed with the imaging.  She and her husband both understand and agree with this plan I think it is very appropriate.     Clinical Course as of Nov 03 113  Thu Nov 02, 2020  4270 Stable troponin of 10 with delta of 4 compared to last value.  Troponin I (High Sensitivity) [CF]  0553 After fluid bolus, the patient's heart rate has improved to about 95.  She is remained asymptomatic throughout her 5+ hours in the emergency department.  Her D-dimer was 441 which is within normal limits even without age-adjustment.  I counseled her that I think she is a little bit volume depleted/dehydrated which accounts for some of the lab abnormalities.  I am giving her potassium 40 mEq by mouth prior to discharge for some hypokalemia and I am giving her prescriptions for potassium and magnesium supplements.  She will follow up with her regular doctor and I gave my usual customary return precautions.  She and her husband are very comfortable with this plan.   [CF]    Clinical Course User Index [CF] Hinda Kehr, MD     ____________________________________________  FINAL CLINICAL IMPRESSION(S) / ED DIAGNOSES  Final diagnoses:  Sinus tachycardia  Hypokalemia     MEDICATIONS  GIVEN  DURING THIS VISIT:  Medications  sodium chloride 0.9 % bolus 500 mL (0 mLs Intravenous Stopped 11/02/20 0530)  potassium chloride SA (KLOR-CON) CR tablet 40 mEq (40 mEq Oral Given 11/02/20 9166)     ED Discharge Orders         Ordered    potassium chloride SA (KLOR-CON M20) 20 MEQ tablet  Daily        11/02/20 0556          *Please note:  Dashanae E Ozier was evaluated in Emergency Department on 11/03/2020 for the symptoms described in the history of present illness. She was evaluated in the context of the global COVID-19 pandemic, which necessitated consideration that the patient might be at risk for infection with the SARS-CoV-2 virus that causes COVID-19. Institutional protocols and algorithms that pertain to the evaluation of patients at risk for COVID-19 are in a state of rapid change based on information released by regulatory bodies including the CDC and federal and state organizations. These policies and algorithms were followed during the patient's care in the ED.  Some ED evaluations and interventions may be delayed as a result of limited staffing during and after the pandemic.*  Note:  This document was prepared using Dragon voice recognition software and may include unintentional dictation errors.   Hinda Kehr, MD 11/03/20 [id number]

## 2020-11-02 NOTE — ED Notes (Signed)
Pt endorsing at 0030 pt was urinating at home and suddenly felt her heart was racing, and pt noticed shakiness in hands. Pt presents today with tachycardia and placed on monitor in room. Pt endorses that this has never happened to their knowledge before.

## 2020-11-02 NOTE — Discharge Instructions (Signed)
Your workup in the Emergency Department today was reassuring.  We did not find any specific abnormalities.  We recommend you drink plenty of fluids (you may be a little bit dehydrated), take your regular medications and/or any new ones prescribed today, and follow up with the doctor(s) listed in these documents as recommended.  I provided a prescription for potassium supplements, but you may want to look in the supplement/vitamin section at your local pharmacy or grocery store for a potassium supplement as well as a magnesium supplement.  You can also discuss this with Dr. Ouida Sills when you follow-up with him.  Return to the Emergency Department if you develop new or worsening symptoms that concern you.

## 2020-11-16 ENCOUNTER — Telehealth: Payer: Self-pay | Admitting: Urology

## 2020-11-16 NOTE — Telephone Encounter (Signed)
Please let Crystal Foley know that we are still needing her to have her ultrasound of her kidneys.

## 2020-12-01 ENCOUNTER — Other Ambulatory Visit: Payer: Self-pay | Admitting: Radiology

## 2020-12-01 DIAGNOSIS — N201 Calculus of ureter: Secondary | ICD-10-CM

## 2020-12-01 NOTE — Telephone Encounter (Signed)
Patient states she will schedule when radiology department calls her. She isn't able to take their call at this time.

## 2020-12-11 ENCOUNTER — Other Ambulatory Visit: Payer: Self-pay

## 2020-12-11 ENCOUNTER — Ambulatory Visit (INDEPENDENT_AMBULATORY_CARE_PROVIDER_SITE_OTHER): Payer: Medicare Other | Admitting: Podiatry

## 2020-12-11 ENCOUNTER — Encounter: Payer: Self-pay | Admitting: Podiatry

## 2020-12-11 DIAGNOSIS — E119 Type 2 diabetes mellitus without complications: Secondary | ICD-10-CM

## 2020-12-11 DIAGNOSIS — B351 Tinea unguium: Secondary | ICD-10-CM

## 2020-12-11 DIAGNOSIS — M79609 Pain in unspecified limb: Secondary | ICD-10-CM | POA: Diagnosis not present

## 2020-12-11 NOTE — Progress Notes (Signed)

## 2021-02-05 ENCOUNTER — Telehealth: Payer: Self-pay

## 2021-02-05 NOTE — Telephone Encounter (Signed)
Patient called stating PCP would like for her to restart Lipitor. She believes you directed her before to not take this because it may cause her to form more kidney stones? Are you ok with her starting Lipitor? Please Adivse

## 2021-02-05 NOTE — Telephone Encounter (Signed)
Lipitor does not contribute to kidney stone formation.  She may have had to stop Lipitor to take Diflucan for a yeast infection.  That is the only interaction I can think of with Lipitor and something we would prescribe.  No urological reason to not take the Lipitor, so she can take it.

## 2021-02-05 NOTE — Telephone Encounter (Signed)
Patient notified and voiced understanding.

## 2021-03-14 ENCOUNTER — Other Ambulatory Visit: Payer: Self-pay

## 2021-03-14 ENCOUNTER — Ambulatory Visit
Admission: RE | Admit: 2021-03-14 | Discharge: 2021-03-14 | Disposition: A | Discharge status: Home / Self Care | Payer: Medicare Other | Source: Ambulatory Visit | Attending: Urology | Admitting: Urology

## 2021-03-14 DIAGNOSIS — N201 Calculus of ureter: Secondary | ICD-10-CM | POA: Diagnosis present

## 2021-03-21 NOTE — Progress Notes (Signed)
03/22/2021 8:31 AM   Crystal Foley 02/10/1951 [id number]  Referring provider: [name], MD [facility] [place],  [place] [address]  Chief Complaint  Patient presents with  . Nephrolithiasis   Urological history: 1. Nephrolithiasis -most recent episode of stone in 04/2020 with the spontaneous passage of a right 3 mm stone -URS x 1 in 2016 -stone composition 99% calcium oxalate monohydrate and 1% calcium phosphate carbonate -24 hour in 2016 noted extremely low urine volume, borderline hyperoxaluria, high calcium oxalate stone risk, mild calcium phosphate stone risk and mild uric acid supersaturation -most recent imaging RUS 02/2021 - no stones  2. High risk hematuria -non-smoker -non-contrast CT's x several - nephrolithiasis and bilateral peripelvic cysts  -cysto 2016 NED -secondary to nephrolithiasis -UA negative for micro heme  3. Incontinence -at goal by avoiding diet sodas -completed series of PTNS   HPI: Crystal Foley is a 70 y.o. female who presents today for follow up.    RUS 02/2021 findings for bilateral peripelvic cyst making it difficult to evaluate for hydro.  Recent serum creatinine 0.7 in 02/2021.  She has no urinary complaints at this visit.  She is actually down to drinking 2 diet Coca-Cola's daily and she is drinking mostly water.  Her incontinent episodes have decreased immensely and she is very satisfied.  Patient denies any modifying or aggravating factors.  Patient denies any gross hematuria, dysuria or suprapubic/flank pain.  Patient denies any fevers, chills, nausea or vomiting.   KUB no stones visualized.   PMH: Past Medical History:  Diagnosis Date  . Asthma   . Bilateral kidney stones   . COPD (chronic obstructive pulmonary disease) ([place])   . Diabetes mellitus   . Generalized convulsive epilepsy ([place])   . 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 Hematuria, gross    . Hydronephrosis   . Hypercholesterolemia   . Hypertension   . Hypothyroidism   . Nephrolithiasis   . Nephrolithiasis   . Seizures ([place])    last seizure 7-8 yrs ago  . Shortness of breath dyspnea    with exertion    Surgical History: Past Surgical History:  Procedure Laterality Date  . Bladder tack    . COLONOSCOPY    . COLONOSCOPY WITH PROPOFOL N/A 03/25/2018   Procedure: COLONOSCOPY WITH PROPOFOL;  Surgeon: Toledo, Benay Pike, MD;  Location: [facility] ENDOSCOPY;  Service: Gastroenterology;  Laterality: N/A;  . COLONOSCOPY WITH PROPOFOL N/A 05/13/2018   Procedure: COLONOSCOPY WITH PROPOFOL;  Surgeon: Toledo, Benay Pike, MD;  Location: [facility] ENDOSCOPY;  Service: Gastroenterology;  Laterality: N/A;  . CYSTOSCOPY W/ URETERAL STENT PLACEMENT  03/11/15  . CYSTOSCOPY WITH RETROGRADE PYELOGRAM, URETEROSCOPY AND STENT PLACEMENT Left 03/17/2015   Procedure: CYSTOSCOPY, STENT REMOVAL, LEFT URETEROSCOPY  WITH STONE REMOVAL WITH BASKET;  Surgeon: Irine Seal, MD;  Location: [facility];  Service: Urology;  Laterality: Left;  . SHOULDER ARTHROSCOPY DISTAL CLAVICLE EXCISION AND OPEN ROTATOR CUFF REPAIR    . TUBAL LIGATION      Home Medications:  Allergies as of 03/22/2021      Reactions   Dilantin [phenytoin Sodium Extended] Swelling   Phenytoin Swelling      Medication List       Accurate as of March 22, 2021 11:59 PM. If you have any questions, ask your nurse or doctor.        STOP taking these medications   metFORMIN 500 MG 24 hr tablet Commonly  known as: GLUCOPHAGE-XR Stopped by: [name], PA-C   potassium chloride SA 20 MEQ tablet Commonly known as: Klor-Con M20 Stopped by: Yonas Bunda, PA-C     TAKE these medications   albuterol 108 (90 Base) MCG/ACT inhaler Commonly known as: VENTOLIN HFA Inhale 1 puff into the lungs every 6 (six) hours as needed for wheezing or shortness of breath.   atorvastatin 40 MG tablet Commonly known as: LIPITOR Take 40 mg by mouth daily.    canagliflozin 300 MG Tabs tablet Commonly known as: INVOKANA Take by mouth.   clotrimazole-betamethasone cream Commonly known as: LOTRISONE Apply 1 application topically 2 (two) times daily.   conjugated estrogens vaginal cream Commonly known as: Premarin Apply 0.48m (pea-sized amount)  just inside the vaginal introitus with a finger-tip on  Monday, Wednesday and Friday nights.   diclofenac sodium 1 % Gel Commonly known as: VOLTAREN Apply 4 g topically 4 (four) times daily.   divalproex 250 MG 24 hr tablet Commonly known as: DEPAKOTE ER Take 500-750 mg by mouth 2 (two) times daily. Take 5030min morning and take 75059mt night   estradiol 0.1 MG/GM vaginal cream Commonly known as: ESTRACE VAGINAL Apply 0.5mg73mea-sized amount)  just inside the vaginal introitus with a finger-tip on Monday, Wednesday and Friday nights.   fluticasone 50 MCG/ACT nasal spray Commonly known as: FLONASE Place 2 sprays into both nostrils daily as needed for allergies or rhinitis.   glimepiride 2 MG tablet Commonly known as: AMARYL TAKE 1 TABLET DAILY WITH  BREAKFAST   losartan 100 MG tablet Commonly known as: COZAAR Take by mouth.   Rybelsus 7 MG Tabs Generic drug: Semaglutide Take 1 tablet by mouth daily.   tamsulosin 0.4 MG Caps capsule Commonly known as: FLOMAX TAKE 1 CAPSULE BY MOUTH EVERY DAY       Allergies:  Allergies  Allergen Reactions  . Dilantin [Phenytoin Sodium Extended] Swelling  . Phenytoin Swelling    Family History: Family History  Problem Relation Age of Onset  . Coronary artery disease Sister   . Heart disease Sister   . Brain cancer Father   . Emphysema Mother     Social History:  reports that she has never smoked. She has never used smokeless tobacco. She reports that she does not drink alcohol and does not use drugs.  ROS: Pertinent ROS in HPI  Physical Exam: BP 124/78   Pulse (!) 118   Ht '5\' 4"'  (1.626 m)   Wt 176 lb (79.8 kg)   BMI 30.21 kg/m    Constitutional:  Well nourished. Alert and oriented, No acute distress. HEENT: [place] AT, mask in place.  Trachea midline Cardiovascular: No clubbing, cyanosis, or edema. Respiratory: Normal respiratory effort, no increased work of breathing. Neurologic: Grossly intact, no focal deficits, moving all 4 extremities. Psychiatric: Normal mood and affect.    Laboratory Data:  Ref Range & Units 1 mo ago  Hemoglobin A1C 4.2 - 5.6 % 8.3High   Average Blood Glucose (Calc) mg/dL 192   Resulting Agency  [facility]   Narrative  Normal Range:  4.2 - 5.6%  Increased Risk: 5.7 - 6.4%  Diabetes:    >= 6.5%  Glycemic Control for adults with diabetes: <7%  Specimen Collected: 01/24/21 08:16 Last Resulted: 01/24/21 10:17  [facility] Received: 03/14/21 09:46    Ref Range & Units 1 mo ago  Glucose 70 - 110 mg/dL 183High   Sodium 136 -  145 mmol/L 140   Potassium 3.6 - 5.1 mmol/L 4.6   Chloride 97 - 109 mmol/L 102   Carbon Dioxide (CO2) 22.0 - 32.0 mmol/L 30.3   Calcium 8.7 - 10.3 mg/dL 9.2   Urea Nitrogen (BUN) 7 - 25 mg/dL 13   Creatinine 0.6 - 1.1 mg/dL 0.7   Glomerular Filtration Rate (eGFR), MDRD Estimate >60 mL/min/1.73sq m 83   BUN/Crea Ratio 6.0 - 20.0 18.6   Anion Gap w/K 6.0 - 16.0 12.3   Resulting Agency  [facility]  Specimen Collected: 01/24/21 08:16 Last Resulted: 01/24/21 12:03  Received From: [facility]  Result Received: 03/14/21 09:46    Urinalysis Component     Latest Ref Rng & Units 03/22/2021  Specific Gravity, UA     1.005 - 1.030 1.025  pH, UA     5.0 - 7.5 6.0  Color, UA     Yellow Yellow  Appearance Ur     Clear Hazy (A)  Leukocytes,UA     Negative Trace (A)  Protein,UA     Negative/Trace Negative  Glucose, UA     Negative 2+ (A)  Ketones, UA     Negative 1+ (A)  RBC, UA     Negative Negative  Bilirubin, UA     Negative Negative  Urobilinogen, Ur      0.2 - 1.0 mg/dL 1.0  Nitrite, UA     Negative Negative  Microscopic Examination      See below:   Component     Latest Ref Rng & Units 03/22/2021  WBC, UA     0 - 5 /hpf 11-30 (A)  RBC     0 - 2 /hpf 0-2  Epithelial Cells (non renal)     0 - 10 /hpf >10 (A)  Renal Epithel, UA     None seen /hpf 0-10 (A)  Mucus, UA     Not Estab. Present  Bacteria, UA     None seen/Few Moderate (A)  I have reviewed the labs.   Pertinent Imaging: Narrative & Impression  CLINICAL DATA:  Right ureteral stone  EXAM: RENAL / URINARY TRACT ULTRASOUND COMPLETE  COMPARISON:  05/18/2020, CT 05/02/2020, ultrasound 01/18/2020  FINDINGS: Right Kidney:  Renal measurements: 12.4 x 6.5 by 6.3 cm = volume: 266.1 mL. Echogenicity within normal limits. Multiple large parapelvic cysts, dominant parapelvic cyst measures 6.4 x 5.1 x 6.8 cm. Similar configuration compared to prior ultrasound.  Left Kidney:  Renal measurements: 12.6 x 6.2 by 7.6 cm = volume: 315.3 mL. Cortical echogenicity is normal. Multiple parapelvic cysts, the largest is seen at the midpole and measures 5.4 x 6.1 x 3.1 cm. Lower pole parapelvic cyst measuring 6.2 x 2.9 x 4.5 cm.  Bladder:  Appears normal for degree of bladder distention.  Other:  None.  IMPRESSION: 1. Assessment for hydronephrosis limited by the presence of numerous large parapelvic cysts. Overall appearance of parapelvic cysts and renal configuration is stable as compared with prior CT and ultrasound. No shadowing calculi are visualized.   Electronically Signed   By: Donavan Foil M.D.   On: 03/14/2021 20:55   I have independently reviewed the films.  See HPI.   Assessment & Plan:    1. Gross hematuria -resolved -secondary to ureteral stone   2. Nephrolithiasis -No stones seen on RUS or KUB  3. Peripelvic cysts -Stable -Discussed with patient that it is difficult to ascertain whether or not hydronephrosis is present with the  peripelvic  cysts and offered her a CT urogram at this time for further evaluation.  She deferred and I find this reasonable as cyst appears similar on previous imaging studies and her serum creatinine is normal and she is not having hematuria or flank pain at this time.  4. Incontinence -Treated with PTNS -Avoiding diet Cokes mostly -At goal  Return in about 1 year (around 03/22/2022) for RUS and KUB for follow up .  These notes generated with voice recognition software. I apologize for typographical errors.  [name], PA-C  [facility] [address]  [place], [place] [address] [phone]

## 2021-03-22 ENCOUNTER — Ambulatory Visit
Admission: RE | Admit: 2021-03-22 | Discharge: 2021-03-22 | Disposition: A | Discharge status: Home / Self Care | Payer: Medicare Other | Source: Ambulatory Visit | Attending: Urology | Admitting: Urology

## 2021-03-22 ENCOUNTER — Ambulatory Visit
Admission: RE | Admit: 2021-03-22 | Discharge: 2021-03-22 | Disposition: A | Discharge status: Home / Self Care | Payer: Medicare Other | Attending: Urology | Admitting: Urology

## 2021-03-22 ENCOUNTER — Ambulatory Visit (INDEPENDENT_AMBULATORY_CARE_PROVIDER_SITE_OTHER): Payer: Medicare Other | Admitting: Urology

## 2021-03-22 ENCOUNTER — Encounter: Payer: Self-pay | Admitting: Urology

## 2021-03-22 ENCOUNTER — Other Ambulatory Visit: Payer: Self-pay

## 2021-03-22 VITALS — BP 124/78 | HR 118 | Ht 64.0 in | Wt 176.0 lb

## 2021-03-22 DIAGNOSIS — R310 Gross hematuria: Secondary | ICD-10-CM | POA: Diagnosis not present

## 2021-03-22 DIAGNOSIS — N281 Cyst of kidney, acquired: Secondary | ICD-10-CM

## 2021-03-22 DIAGNOSIS — N200 Calculus of kidney: Secondary | ICD-10-CM | POA: Diagnosis not present

## 2021-03-22 DIAGNOSIS — N3946 Mixed incontinence: Secondary | ICD-10-CM

## 2021-03-22 LAB — MICROSCOPIC EXAMINATION: Epithelial Cells (non renal): >10 /hpf — AB (ref 0–10)

## 2021-03-22 LAB — URINALYSIS, COMPLETE
Bilirubin, UA: NEGATIVE
Nitrite, UA: NEGATIVE
Protein,UA: NEGATIVE
RBC, UA: NEGATIVE
Specific Gravity, UA: 1.025 (ref 1.005–1.030)
Urobilinogen, Ur: 1 mg/dL (ref 0.2–1.0)
pH, UA: 6 (ref 5.0–7.5)

## 2021-04-12 ENCOUNTER — Ambulatory Visit (INDEPENDENT_AMBULATORY_CARE_PROVIDER_SITE_OTHER): Payer: Medicare Other | Admitting: Podiatry

## 2021-04-12 ENCOUNTER — Encounter: Payer: Self-pay | Admitting: Podiatry

## 2021-04-12 ENCOUNTER — Other Ambulatory Visit: Payer: Self-pay

## 2021-04-12 DIAGNOSIS — E119 Type 2 diabetes mellitus without complications: Secondary | ICD-10-CM

## 2021-04-12 DIAGNOSIS — B351 Tinea unguium: Secondary | ICD-10-CM | POA: Diagnosis not present

## 2021-04-12 DIAGNOSIS — M79609 Pain in unspecified limb: Secondary | ICD-10-CM

## 2021-04-12 DIAGNOSIS — M79676 Pain in unspecified toe(s): Secondary | ICD-10-CM | POA: Diagnosis not present

## 2021-05-30 ENCOUNTER — Emergency Department: Payer: Medicare Other

## 2021-05-30 ENCOUNTER — Emergency Department
Admission: EM | Admit: 2021-05-30 | Discharge: 2021-05-30 | Disposition: A | Discharge status: Home / Self Care | Payer: Medicare Other | Attending: Emergency Medicine | Admitting: Emergency Medicine

## 2021-05-30 ENCOUNTER — Other Ambulatory Visit: Payer: Self-pay

## 2021-05-30 ENCOUNTER — Encounter: Payer: Self-pay | Admitting: *Deleted

## 2021-05-30 DIAGNOSIS — R079 Chest pain, unspecified: Secondary | ICD-10-CM | POA: Diagnosis present

## 2021-05-30 DIAGNOSIS — N182 Chronic kidney disease, stage 2 (mild): Secondary | ICD-10-CM | POA: Insufficient documentation

## 2021-05-30 DIAGNOSIS — E039 Hypothyroidism, unspecified: Secondary | ICD-10-CM | POA: Diagnosis not present

## 2021-05-30 DIAGNOSIS — Z7984 Long term (current) use of oral hypoglycemic drugs: Secondary | ICD-10-CM | POA: Diagnosis not present

## 2021-05-30 DIAGNOSIS — J449 Chronic obstructive pulmonary disease, unspecified: Secondary | ICD-10-CM | POA: Insufficient documentation

## 2021-05-30 DIAGNOSIS — I129 Hypertensive chronic kidney disease with stage 1 through stage 4 chronic kidney disease, or unspecified chronic kidney disease: Secondary | ICD-10-CM | POA: Insufficient documentation

## 2021-05-30 DIAGNOSIS — E1122 Type 2 diabetes mellitus with diabetic chronic kidney disease: Secondary | ICD-10-CM | POA: Diagnosis not present

## 2021-05-30 DIAGNOSIS — Z79899 Other long term (current) drug therapy: Secondary | ICD-10-CM | POA: Insufficient documentation

## 2021-05-30 DIAGNOSIS — J45909 Unspecified asthma, uncomplicated: Secondary | ICD-10-CM | POA: Diagnosis not present

## 2021-05-30 DIAGNOSIS — R0602 Shortness of breath: Secondary | ICD-10-CM | POA: Diagnosis not present

## 2021-05-30 LAB — CBC
HCT: 41.8 % (ref 36.0–46.0)
Hemoglobin: 14.2 g/dL (ref 12.0–15.0)
MCH: 31.1 pg (ref 26.0–34.0)
MCHC: 34 g/dL (ref 30.0–36.0)
MCV: 91.7 fL (ref 80.0–100.0)
Platelets: 145 10*3/uL — ABNORMAL LOW (ref 150–400)
RBC: 4.56 MIL/uL (ref 3.87–5.11)
RDW: 12.2 % (ref 11.5–15.5)
WBC: 5.5 10*3/uL (ref 4.0–10.5)
nRBC: 0 % (ref 0.0–0.2)

## 2021-05-30 LAB — BASIC METABOLIC PANEL
Anion gap: 7 (ref 5–15)
BUN: 12 mg/dL (ref 8–23)
CO2: 26 mmol/L (ref 22–32)
Calcium: 9.2 mg/dL (ref 8.9–10.3)
Chloride: 101 mmol/L (ref 98–111)
Creatinine, Ser: 0.63 mg/dL (ref 0.44–1.00)
GFR, Estimated: >60 mL/min (ref >60)
Glucose, Bld: 267 mg/dL — ABNORMAL HIGH (ref 70–99)
Potassium: 3.9 mmol/L (ref 3.5–5.1)
Sodium: 134 mmol/L — ABNORMAL LOW (ref 135–145)

## 2021-05-30 LAB — TROPONIN I (HIGH SENSITIVITY)
Troponin I (High Sensitivity): 3 ng/L (ref <18)
Troponin I (High Sensitivity): 2 ng/L (ref <18)

## 2021-05-30 MED ORDER — ALUM & MAG HYDROXIDE-SIMETH 200-200-20 MG/5ML PO SUSP
30.0000 mL | Freq: Once | ORAL | Status: AC
  Administered 2021-05-30: 30 mL via ORAL
  Filled 2021-05-30: qty 30

## 2021-05-30 MED ORDER — LIDOCAINE VISCOUS HCL 2 % MT SOLN
15.0000 mL | Freq: Once | OROMUCOSAL | Status: AC
  Administered 2021-05-30: 15 mL via ORAL
  Filled 2021-05-30: qty 15

## 2021-05-30 NOTE — Discharge Instructions (Addendum)
Your exam, labs, x-ray, EKG, and cardiac markers all normal reassuring this time.  Your chest pain does not appear to represent an acute cardiac injury.  You should follow-up with your primary provider for ongoing symptoms.  Consider evaluation by a cardiologist if symptoms persist.

## 2021-05-30 NOTE — ED Provider Notes (Signed)
[facility] Emergency Department Provider Note  ____________________________________________   Event Date/Time   First MD Initiated Contact with Patient 05/30/21 1717     (approximate)  I have reviewed the triage vital signs and the nursing notes.   HISTORY  Chief Complaint Chest Pain  HPI Crystal Foley is a 70 y.o. female with the below medical history, presents to the ED for evaluation of chest pain with onset this morning.  Patient has noted some shortness of breath, and reports no improvement with the use of her inhaler.  She describes the pain feels like pressure in her chest.  She denies any associated diaphoresis, nausea, vomiting, or syncope.  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[place])    Hematuria, gross    Hydronephrosis    Hypercholesterolemia    Hypertension    Hypothyroidism    Nephrolithiasis    Nephrolithiasis    Seizures ([place])    last seizure 7-8 yrs ago   Shortness of breath dyspnea    with exertion    Patient Active Problem List   Diagnosis Date Noted   Aortic atherosclerosis ([place]) 12/29/2018   Community acquired pneumonia 12/24/2018   Sepsis ([place]) 12/23/2018   Health care maintenance 09/19/2015   Calculus of kidney 06/01/2015   Hypercholesterolemia without hypertriglyceridemia 09/27/2014   Pure hypercholesterolemia 09/27/2014   Adult BMI 30+ 09/25/2014   Morbid obesity ([place]) 09/25/2014   Severe obesity (BMI 35.0-35.9 with comorbidity) ([place]) 09/25/2014   Asthma 05/15/2014   Benign hypertension 05/15/2014   HLD (hyperlipidemia) 05/15/2014   Seizure ([place]) 05/15/2014   Diabetes mellitus, type 2 ([place]) 05/15/2014   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[place]) 05/15/2014    Past  Surgical History:  Procedure Laterality Date   Bladder tack     COLONOSCOPY     COLONOSCOPY WITH PROPOFOL N/A 03/25/2018   Procedure: COLONOSCOPY WITH PROPOFOL;  Surgeon: Toledo, Benay Pike, MD;  Location: [facility] ENDOSCOPY;  Service: Gastroenterology;  Laterality: N/A;   COLONOSCOPY WITH PROPOFOL N/A 05/13/2018   Procedure: COLONOSCOPY WITH PROPOFOL;  Surgeon: Toledo, Benay Pike, MD;  Location: [facility] ENDOSCOPY;  Service: Gastroenterology;  Laterality: N/A;   CYSTOSCOPY W/ URETERAL STENT PLACEMENT  03/11/15   CYSTOSCOPY WITH RETROGRADE PYELOGRAM, URETEROSCOPY AND STENT PLACEMENT Left 03/17/2015   Procedure: CYSTOSCOPY, STENT REMOVAL, LEFT URETEROSCOPY  WITH STONE REMOVAL WITH BASKET;  Surgeon: Irine Seal, MD;  Location: [facility];  Service: Urology;  Laterality: Left;   SHOULDER ARTHROSCOPY DISTAL CLAVICLE EXCISION AND OPEN ROTATOR CUFF REPAIR     TUBAL LIGATION      Prior to Admission medications   Medication Sig Start Date End Date Taking? Authorizing Provider  albuterol (PROVENTIL HFA;VENTOLIN HFA) 108 (90 BASE) MCG/ACT inhaler Inhale 1 puff into the lungs every 6 (six) hours as needed for wheezing or shortness of breath.    [provider]  atorvastatin (LIPITOR) 40 MG tablet Take 40 mg by mouth daily.    [provider]  canagliflozin (INVOKANA) 300 MG TABS tablet Take by mouth. 03/01/19   [provider]  clotrimazole-betamethasone (LOTRISONE) cream Apply 1 application topically 2 (two) times daily. 12/29/19   [name] A, PA-C  conjugated estrogens (PREMARIN) vaginal cream Apply 0.5mg  (pea-sized amount)  just inside  the vaginal introitus with a finger-tip on  Monday, Wednesday and Friday nights. 02/17/19   [name] A, PA-C  diclofenac sodium (VOLTAREN) 1 % GEL Apply 4 g topically 4 (four) times daily.    [provider]  divalproex (DEPAKOTE ER) 250 MG 24 hr tablet Take 500-750 mg by mouth 2 (two) times daily. Take 500mg  in morning and take 750mg  at  night    [provider]  estradiol (ESTRACE VAGINAL) 0.1 MG/GM vaginal cream Apply 0.5mg  (pea-sized amount)  just inside the vaginal introitus with a finger-tip on Monday, Wednesday and Friday nights. 02/17/19   McGowan, [name] A, PA-C  fluticasone (FLONASE) 50 MCG/ACT nasal spray Place 2 sprays into both nostrils daily as needed for allergies or rhinitis.    [provider]  glimepiride (AMARYL) 2 MG tablet TAKE 1 TABLET DAILY WITH  BREAKFAST 03/20/20   [provider]  losartan (COZAAR) 100 MG tablet Take by mouth. 06/20/20 06/20/21  [provider]  RYBELSUS 7 MG TABS Take 1 tablet by mouth daily. 06/21/20   [provider]  tamsulosin (FLOMAX) 0.4 MG CAPS capsule TAKE 1 CAPSULE BY MOUTH EVERY DAY 05/26/20   McGowan, [name] A, PA-C    Allergies Dilantin [phenytoin sodium extended], Metformin, and Phenytoin  Family History  Problem Relation Age of Onset   Coronary artery disease Sister    Heart disease Sister    Brain cancer Father    Emphysema Mother     Social History Social History   Tobacco Use   Smoking status: Never   Smokeless tobacco: Never  Vaping Use   Vaping Use: Never used  Substance Use Topics   Alcohol use: No   Drug use: No    Review of Systems  Constitutional: No fever/chills Eyes: No visual changes. ENT: No sore throat. Cardiovascular: Reports chest pain. Respiratory: Reports shortness of breath. Gastrointestinal: No abdominal pain.  No nausea, no vomiting.  No diarrhea.  No constipation. Genitourinary: Negative for dysuria. Musculoskeletal: Negative for back pain. Skin: Negative for rash. Neurological: Negative for headaches, focal weakness or numbness. ____________________________________________   PHYSICAL EXAM:  VITAL SIGNS: ED Triage Vitals  Enc Vitals Group     BP 05/30/21 1528 132/75     Pulse Rate 05/30/21 1528 (!) 110     Resp 05/30/21 1528 18     Temp 05/30/21 1528 98 F (36.7 C)     Temp  Source 05/30/21 1528 Oral     SpO2 05/30/21 1528 99 %     Weight 05/30/21 1529 168 lb (76.2 kg)     Height 05/30/21 1529 5\' 4"  (1.626 m)     Head Circumference --      Peak Flow --      Pain Score 05/30/21 1529 4     Pain Loc --      Pain Edu? --      Excl. in [place]? --     Constitutional: Alert and oriented. Well appearing and in no acute distress. Eyes: Conjunctivae are normal. PERRL. EOMI. Head: Atraumatic. Nose: No congestion/rhinnorhea. Mouth/Throat: Mucous membranes are moist.  Oropharynx non-erythematous. Neck: No stridor.   Cardiovascular: Normal rate, regular rhythm. Grossly normal heart sounds.  Good peripheral circulation. Respiratory: Normal respiratory effort.  No retractions. Lungs CTAB. Gastrointestinal: Soft and nontender. No distention. No abdominal bruits. No CVA tenderness. Musculoskeletal: No lower extremity tenderness nor edema.  No joint effusions. Neurologic:  Normal speech and language. No gross focal neurologic deficits are appreciated. No gait instability.  Skin:  Skin is warm, dry and intact. No rash noted. Psychiatric: Mood and affect are normal. Speech and behavior are normal.  ____________________________________________   LABS (all labs ordered are listed, but only abnormal results are displayed)  Labs Reviewed  BASIC METABOLIC PANEL - Abnormal; Notable for the following components:      Result Value   Sodium 134 (*)    Glucose, Bld 267 (*)    All other components within normal limits  CBC - Abnormal; Notable for the following components:   Platelets 145 (*)    All other components within normal limits  TROPONIN I (HIGH SENSITIVITY)  TROPONIN I (HIGH SENSITIVITY)   ____________________________________________  EKG  Vent. rate 107 BPM PR interval 140 ms QRS duration 76 ms QT/QTcB 324/432 ms P-R-T axes 56 63 79 ____________________________________________  RADIOLOGY I, Melvenia Needles, personally viewed and evaluated these images  (plain radiographs) as part of my medical decision making, as well as reviewing the written report by the radiologist.  ED MD interpretation:  agree with report  Official radiology report(s): No results found.  CXR  IMPRESSION: No active cardiopulmonary disease. ____________________________________________   PROCEDURES  Procedure(s) performed (including Critical Care):  Procedures  GI cocktail w/ lido  ____________________________________________   INITIAL IMPRESSION / ASSESSMENT AND PLAN / ED COURSE  As part of my medical decision making, I reviewed the following data within the [facility] reviewed WNL, EKG interpreted tachycardia, Radiograph reviewed NAD, and Notes from prior ED visits   Differential diagnosis includes, but is not limited to, ACS, aortic dissection, pulmonary embolism, cardiac tamponade, pneumothorax, pneumonia, pericarditis, myocarditis, GI-related causes including esophagitis/gastritis, and musculoskeletal chest wall pain.    Geriatric patient with ED evaluation of chest pain on presentation to the ED.  Patient's clinical picture is reassuring at this time.  No signs of any malignant arrhythmia, intrathoracic process on x-ray, or acute coronary syndrome.  Patient's troponin is negative x2.  She does note improvement of her symptoms after GI cocktail is provided in the ED.  Patient stable at this time for discharge.  She will be advised to follow with primary provider for ongoing symptoms.  Return precautions have been discussed. ____________________________________________   FINAL CLINICAL IMPRESSION(S) / ED DIAGNOSES  Final diagnoses:  Nonspecific chest pain     ED Discharge Orders     None        Note:  This document was prepared using Dragon voice recognition software and may include unintentional dictation errors.    Melvenia Needles, PA-C 05/31/21 1616    Nance Pear, MD 06/18/21 [phone]

## 2021-05-30 NOTE — ED Notes (Signed)
Patient reports she is having increased chest pain, repeat EKG and repeat trop performed. Jenise PA aware.

## 2021-05-30 NOTE — ED Triage Notes (Signed)
Pt reports chest pain since this morning.  Pt has sob and used inhaler without relief.   States it feels like pressure in chest.  Pt alert  speech clear.

## 2021-08-13 ENCOUNTER — Other Ambulatory Visit: Payer: Self-pay

## 2021-08-13 ENCOUNTER — Ambulatory Visit (INDEPENDENT_AMBULATORY_CARE_PROVIDER_SITE_OTHER): Payer: Medicare Other | Admitting: Podiatry

## 2021-08-13 ENCOUNTER — Encounter: Payer: Self-pay | Admitting: Podiatry

## 2021-08-13 DIAGNOSIS — E119 Type 2 diabetes mellitus without complications: Secondary | ICD-10-CM

## 2021-08-13 DIAGNOSIS — B351 Tinea unguium: Secondary | ICD-10-CM | POA: Diagnosis not present

## 2021-08-13 DIAGNOSIS — M79609 Pain in unspecified limb: Secondary | ICD-10-CM

## 2021-08-13 NOTE — Progress Notes (Signed)

## 2021-11-29 ENCOUNTER — Other Ambulatory Visit: Payer: Self-pay | Admitting: Urology

## 2021-12-13 ENCOUNTER — Other Ambulatory Visit: Payer: Self-pay

## 2021-12-13 ENCOUNTER — Ambulatory Visit (INDEPENDENT_AMBULATORY_CARE_PROVIDER_SITE_OTHER): Payer: Medicare Other | Admitting: Podiatry

## 2021-12-13 ENCOUNTER — Encounter: Payer: Self-pay | Admitting: Podiatry

## 2021-12-13 DIAGNOSIS — E119 Type 2 diabetes mellitus without complications: Secondary | ICD-10-CM

## 2021-12-13 DIAGNOSIS — B351 Tinea unguium: Secondary | ICD-10-CM | POA: Diagnosis not present

## 2021-12-13 DIAGNOSIS — M79609 Pain in unspecified limb: Secondary | ICD-10-CM | POA: Diagnosis not present

## 2021-12-13 NOTE — Progress Notes (Signed)

## 2022-03-19 NOTE — Progress Notes (Signed)
? ? ?03/20/2022 ?3:44 PM  ? ?[name] ?08-May-1951 ?[id number] ? ?Referring provider: [name], MD ?[place],  [place] [address] ? ?Chief Complaint  ?Patient presents with  ? Nephrolithiasis  ? Hematuria  ? Urinary Incontinence  ? ?Urological history: ?1. Nephrolithiasis ?-stone composition 99% calcium oxalate monohydrate and 1% calcium phosphate carbonate ?-24 hour in 2016 noted extremely low urine volume, borderline hyperoxaluria, high calcium oxalate stone risk, mild calcium phosphate stone risk and mild uric acid supersaturation ?-URS x 1 in 2016 ?-most recent episode of stone in 04/2020 with the spontaneous passage of a right 3 mm stone ?-KUB 2023 bilateral nephrolithiasis  ? ?2. High risk hematuria ?-non-smoker ?-non-contrast CT's x several - nephrolithiasis and bilateral peripelvic cysts  ?-cysto 2016 NED ?-secondary to nephrolithiasis ?-reports of gross heme ?-UA 3-10 RBC's  ? ?3. Incontinence ?-at goal by avoiding diet sodas ?-completed series of PTNS  ?-PVR 0 mL  ? ?4. Renal cysts ?-RUS 2022 - numerous ?large parapelvic cysts. Overall appearance of parapelvic cysts and renal configuration is stable as compared with prior CT and ultrasound. ? ?HPI: ?Crystal Foley is a 71 y.o. female who presents today for follow up.   ? ?She is experiencing 1-7 daytime urinations, 1-2 episodes of nocturia and a mild urge to urinate.  She is experiencing stress incontinence.  She states that she has leakage episodes 1-2 times daily.  She is wearing a panty liner daily because of these incontinent episodes.  She engages in toilet mapping and fluid restrictions. ? ?UA 6-10 WBCs, 3-10 RBC's, greater than 10 epithelials cells and moderate bacteria.  She states when she showered this morning she washed her perineum vigorously and saw blood on her wash cloth.   ? ?PVR 0 mL  ? ?KUB bilateral nephrolithiasis - stones ~ 2-4 mm in size ? ?PMH: ?Past Medical History:  ?Diagnosis  Date  ? Asthma   ? Bilateral kidney stones   ? COPD (chronic obstructive pulmonary disease) ([place])   ? Diabetes mellitus   ? Generalized convulsive epilepsy ([place])   ? 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 Hematuria, gross   ? Hydronephrosis   ? Hypercholesterolemia   ? Hypertension   ? Hypothyroidism   ? Nephrolithiasis   ? Nephrolithiasis   ? Seizures ([place])   ? last seizure 7-8 yrs ago  ? Shortness of breath dyspnea   ? with exertion  ? ? ?Surgical History: ?Past Surgical History:  ?Procedure Laterality Date  ? Bladder tack    ? COLONOSCOPY    ? COLONOSCOPY WITH PROPOFOL N/A 03/25/2018  ? Procedure: COLONOSCOPY WITH PROPOFOL;  Surgeon: Toledo, Benay Pike, MD;  Location: [facility];  Service: Gastroenterology;  Laterality: N/A;  ? COLONOSCOPY WITH PROPOFOL N/A 05/13/2018  ? Procedure: COLONOSCOPY WITH PROPOFOL;  Surgeon: Toledo, Benay Pike, MD;  Location: [facility];  Service: Gastroenterology;  Laterality: N/A;  ? CYSTOSCOPY W/ URETERAL STENT PLACEMENT  03/11/15  ? CYSTOSCOPY WITH RETROGRADE PYELOGRAM, URETEROSCOPY AND STENT PLACEMENT Left 03/17/2015  ? Procedure: CYSTOSCOPY, STENT REMOVAL, LEFT URETEROSCOPY  WITH STONE REMOVAL WITH BASKET;  Surgeon: Irine Seal, MD;  Location: [facility];  Service: Urology;  Laterality: Left;  ? SHOULDER ARTHROSCOPY DISTAL CLAVICLE EXCISION AND OPEN ROTATOR CUFF REPAIR    ? TUBAL LIGATION    ? ? ?Home Medications:  ?Allergies as of 03/20/2022   ? ?   Reactions  ? Dilantin [phenytoin Sodium Extended] Swelling  ?  Metformin Diarrhea  ? Phenytoin Swelling  ? ?  ? ?  ?Medication List  ?  ? ?  ? Accurate as of March 20, 2022 11:59 PM. If you have any questions, ask your nurse or doctor.  ?  ?  ? ?  ? ?STOP taking these medications   ? ?conjugated estrogens 0.625 MG/GM vaginal cream ?Commonly known as: Premarin ?Stopped by: [name], PA-C ?  ? ?  ? ?TAKE these medications   ? ?albuterol 108 (90 Base) MCG/ACT inhaler ?Commonly known as: VENTOLIN HFA ?Inhale  1 puff into the lungs every 6 (six) hours as needed for wheezing or shortness of breath. ?  ?atorvastatin 40 MG tablet ?Commonly known as: LIPITOR ?Take 40 mg by mouth daily. ?  ?canagliflozin 300 MG Tabs tablet ?Commonly known as: INVOKANA ?Take by mouth. ?  ?clotrimazole-betamethasone cream ?Commonly known as: LOTRISONE ?Apply 1 application topically 2 (two) times daily. ?  ?diclofenac sodium 1 % Gel ?Commonly known as: VOLTAREN ?Apply 4 g topically 4 (four) times daily. ?  ?divalproex 250 MG 24 hr tablet ?Commonly known as: DEPAKOTE ER ?Take 500-750 mg by mouth 2 (two) times daily. Take 519m in morning and take 7570mat night ?  ?estradiol 0.1 MG/GM vaginal cream ?Commonly known as: ESTRACE VAGINAL ?Apply 0.57m757mpea-sized amount)  just inside the vaginal introitus with a finger-tip on Monday, Wednesday and Friday nights. ?  ?fluticasone 50 MCG/ACT nasal spray ?Commonly known as: FLONASE ?Place 2 sprays into both nostrils daily as needed for allergies or rhinitis. ?  ?glimepiride 2 MG tablet ?Commonly known as: AMARYL ?TAKE 1 TABLET DAILY WITH  BREAKFAST ?  ?glipiZIDE 5 MG tablet ?Commonly known as: GLUCOTROL ?Take 5 mg by mouth daily. ?  ?losartan 100 MG tablet ?Commonly known as: COZAAR ?Take 1 tablet by mouth daily. ?  ?Rybelsus 7 MG Tabs ?Generic drug: Semaglutide ?Take 1 tablet by mouth daily. ?  ?tamsulosin 0.4 MG Caps capsule ?Commonly known as: FLOMAX ?TAKE 1 CAPSULE BY MOUTH EVERY DAY ?  ?Trospium Chloride 60 MG Cp24 ?Take 1 capsule (60 mg total) by mouth daily. ?Started by: SHAZara CouncilA-C ?  ? ?  ? ? ?Allergies:  ?Allergies  ?Allergen Reactions  ? Dilantin [Phenytoin Sodium Extended] Swelling  ? Metformin Diarrhea  ? Phenytoin Swelling  ? ? ?Family History: ?Family History  ?Problem Relation Age of Onset  ? Coronary artery disease Sister   ? Heart disease Sister   ? Brain cancer Father   ? Emphysema Mother   ? ? ?Social History:  reports that she has never smoked. She has never used smokeless  tobacco. She reports that she does not drink alcohol and does not use drugs. ? ?ROS: ?Pertinent ROS in HPI ? ?Physical Exam: ?BP 137/84   Pulse (!) 109   Ht _0  (1.626 m)   Wt 166 lb (75.3 kg)   BMI 28.49 kg/m?   ?Constitutional:  Well nourished. Alert and oriented, No acute distress. ?HEENT: [place] AT, moist mucus membranes.  Trachea midline ?Cardiovascular: No clubbing, cyanosis, or edema. ?Respiratory: Normal respiratory effort, no increased work of breathing. ?GU: No CVA tenderness.  No bladder fullness or masses.  Atrophic external genitalia, normal pubic hair distribution, no lesions.  Normal urethral meatus, no lesions, no prolapse, no discharge.   No urethral masses, tenderness and/or tenderness.  Urethral caruncle noted, no bladder fullness, tenderness or masses.  Red and irritated vagina mucosa likely due to wearing panty liners, poor estrogen effect, no discharge,  no lesions.  Anus and perineum are without rashes or lesions.     ?Neurologic: Grossly intact, no focal deficits, moving all 4 extremities. ?Psychiatric: Normal mood and affect.   ? ?Laboratory Data: ?Glucose 70 - 110 mg/dL 189 High    ?Sodium 136 - 145 mmol/L 137   ?Potassium 3.6 - 5.1 mmol/L 4.4   ?Chloride 97 - 109 mmol/L 100   ?Carbon Dioxide (CO2) 22.0 - 32.0 mmol/L 30.2   ?Calcium 8.7 - 10.3 mg/dL 8.7   ?Urea Nitrogen (BUN) 7 - 25 mg/dL 10   ?Creatinine 0.6 - 1.1 mg/dL 0.7   ?Glomerular Filtration Rate (eGFR), MDRD Estimate >60 mL/min/1.73sq m 83   ?BUN/Crea Ratio 6.0 - 20.0 14.3   ?Anion Gap w/K 6.0 - 16.0 11.2   ?Resulting Agency  [facility]  ?Specimen Collected: 02/06/22 07:28 Last Resulted: 02/06/22 11:45  ?Received From: [facility]  Result Received: 03/19/22 07:59  ? ?Hemoglobin A1C 4.2 - 5.6 % 8.7 High    ?Average Blood Glucose (Calc) mg/dL 203   ?Resulting Agency  [facility]  ?Narrative ?Performed by [facility] ?Normal Range:    4.2 - 5.6%  ?Increased Risk:  5.7 -  6.4%  ?Diabetes:        >= 6.5%  ?Glycemic Control for adults with diabetes:  <7%   ?Specimen Collected: 02/06/22 07:28 Last Resulted: 02/06/22 09:16  ?Received From: [facility]  Result

## 2022-03-20 ENCOUNTER — Encounter: Payer: Self-pay | Admitting: Urology

## 2022-03-20 ENCOUNTER — Ambulatory Visit (INDEPENDENT_AMBULATORY_CARE_PROVIDER_SITE_OTHER): Payer: Medicare Other | Admitting: Urology

## 2022-03-20 ENCOUNTER — Ambulatory Visit
Admission: RE | Admit: 2022-03-20 | Discharge: 2022-03-20 | Disposition: A | Discharge status: Home / Self Care | Payer: Medicare Other | Source: Ambulatory Visit | Attending: Urology | Admitting: Urology

## 2022-03-20 ENCOUNTER — Other Ambulatory Visit: Payer: Self-pay

## 2022-03-20 VITALS — BP 137/84 | HR 109 | Ht 64.0 in | Wt 166.0 lb

## 2022-03-20 DIAGNOSIS — A419 Sepsis, unspecified organism: Secondary | ICD-10-CM | POA: Diagnosis not present

## 2022-03-20 DIAGNOSIS — N200 Calculus of kidney: Secondary | ICD-10-CM | POA: Diagnosis not present

## 2022-03-20 DIAGNOSIS — N281 Cyst of kidney, acquired: Secondary | ICD-10-CM

## 2022-03-20 DIAGNOSIS — R310 Gross hematuria: Secondary | ICD-10-CM | POA: Diagnosis not present

## 2022-03-20 DIAGNOSIS — R319 Hematuria, unspecified: Secondary | ICD-10-CM

## 2022-03-20 DIAGNOSIS — N3946 Mixed incontinence: Secondary | ICD-10-CM

## 2022-03-20 DIAGNOSIS — R55 Syncope and collapse: Secondary | ICD-10-CM | POA: Diagnosis not present

## 2022-03-20 DIAGNOSIS — N952 Postmenopausal atrophic vaginitis: Secondary | ICD-10-CM

## 2022-03-20 LAB — MICROSCOPIC EXAMINATION: Epithelial Cells (non renal): >10 /hpf — AB (ref 0–10)

## 2022-03-20 LAB — URINALYSIS, COMPLETE
Bilirubin, UA: NEGATIVE
Nitrite, UA: NEGATIVE
Protein,UA: NEGATIVE
Specific Gravity, UA: 1.025 (ref 1.005–1.030)
Urobilinogen, Ur: 0.2 mg/dL (ref 0.2–1.0)
pH, UA: 5.5 (ref 5.0–7.5)

## 2022-03-20 LAB — BLADDER SCAN AMB NON-IMAGING

## 2022-03-20 MED ORDER — TROSPIUM CHLORIDE ER 60 MG PO CP24: 1.0000 | ORAL_CAPSULE | Freq: Every day | ORAL | 0 refills | Status: DC

## 2022-03-20 MED ORDER — ESTRADIOL 0.1 MG/GM VA CREA: TOPICAL_CREAM | VAGINAL | 12 refills | Status: DC

## 2022-03-20 NOTE — Patient Instructions (Signed)
?  Apply 0.'5mg'$  (pea-sized amount) other vaginal estrogen cream just inside the vaginal introitus with a finger-tip for 2 weeks nightly and then continue on Monday, Wednesday and Friday nights,   ?  ?

## 2022-03-21 ENCOUNTER — Other Ambulatory Visit: Payer: Self-pay

## 2022-03-21 ENCOUNTER — Emergency Department: Payer: Medicare Other

## 2022-03-21 ENCOUNTER — Observation Stay: Payer: Medicare Other

## 2022-03-21 ENCOUNTER — Inpatient Hospital Stay
Admission: EM | Admit: 2022-03-21 | Discharge: 2022-03-23 | DRG: 872 | Disposition: A | Discharge status: Home / Self Care | Payer: Medicare Other | Attending: Internal Medicine | Admitting: Internal Medicine

## 2022-03-21 DIAGNOSIS — E039 Hypothyroidism, unspecified: Secondary | ICD-10-CM | POA: Diagnosis present

## 2022-03-21 DIAGNOSIS — G40409 Other generalized epilepsy and epileptic syndromes, not intractable, without status epilepticus: Secondary | ICD-10-CM | POA: Diagnosis present

## 2022-03-21 DIAGNOSIS — E119 Type 2 diabetes mellitus without complications: Secondary | ICD-10-CM

## 2022-03-21 DIAGNOSIS — R55 Syncope and collapse: Secondary | ICD-10-CM | POA: Diagnosis not present

## 2022-03-21 DIAGNOSIS — I700 Atherosclerosis of aorta: Secondary | ICD-10-CM | POA: Diagnosis present

## 2022-03-21 DIAGNOSIS — E1165 Type 2 diabetes mellitus with hyperglycemia: Secondary | ICD-10-CM | POA: Diagnosis not present

## 2022-03-21 DIAGNOSIS — N136 Pyonephrosis: Secondary | ICD-10-CM | POA: Diagnosis present

## 2022-03-21 DIAGNOSIS — R569 Unspecified convulsions: Secondary | ICD-10-CM

## 2022-03-21 DIAGNOSIS — E1122 Type 2 diabetes mellitus with diabetic chronic kidney disease: Secondary | ICD-10-CM | POA: Diagnosis present

## 2022-03-21 DIAGNOSIS — I10 Essential (primary) hypertension: Secondary | ICD-10-CM | POA: Diagnosis not present

## 2022-03-21 DIAGNOSIS — K760 Fatty (change of) liver, not elsewhere classified: Secondary | ICD-10-CM | POA: Diagnosis present

## 2022-03-21 DIAGNOSIS — E860 Dehydration: Secondary | ICD-10-CM | POA: Diagnosis present

## 2022-03-21 DIAGNOSIS — Z8249 Family history of ischemic heart disease and other diseases of the circulatory system: Secondary | ICD-10-CM

## 2022-03-21 DIAGNOSIS — E1169 Type 2 diabetes mellitus with other specified complication: Secondary | ICD-10-CM

## 2022-03-21 DIAGNOSIS — E785 Hyperlipidemia, unspecified: Secondary | ICD-10-CM | POA: Diagnosis present

## 2022-03-21 DIAGNOSIS — A419 Sepsis, unspecified organism: Principal | ICD-10-CM | POA: Diagnosis present

## 2022-03-21 DIAGNOSIS — Z79899 Other long term (current) drug therapy: Secondary | ICD-10-CM

## 2022-03-21 DIAGNOSIS — R7989 Other specified abnormal findings of blood chemistry: Secondary | ICD-10-CM | POA: Diagnosis not present

## 2022-03-21 DIAGNOSIS — Z825 Family history of asthma and other chronic lower respiratory diseases: Secondary | ICD-10-CM

## 2022-03-21 DIAGNOSIS — I129 Hypertensive chronic kidney disease with stage 1 through stage 4 chronic kidney disease, or unspecified chronic kidney disease: Secondary | ICD-10-CM | POA: Diagnosis present

## 2022-03-21 DIAGNOSIS — N202 Calculus of kidney with calculus of ureter: Secondary | ICD-10-CM | POA: Diagnosis present

## 2022-03-21 DIAGNOSIS — E7800 Pure hypercholesterolemia, unspecified: Secondary | ICD-10-CM | POA: Diagnosis present

## 2022-03-21 DIAGNOSIS — G40909 Epilepsy, unspecified, not intractable, without status epilepticus: Secondary | ICD-10-CM

## 2022-03-21 DIAGNOSIS — R109 Unspecified abdominal pain: Secondary | ICD-10-CM

## 2022-03-21 DIAGNOSIS — N182 Chronic kidney disease, stage 2 (mild): Secondary | ICD-10-CM | POA: Diagnosis present

## 2022-03-21 DIAGNOSIS — Z808 Family history of malignant neoplasm of other organs or systems: Secondary | ICD-10-CM

## 2022-03-21 DIAGNOSIS — J449 Chronic obstructive pulmonary disease, unspecified: Secondary | ICD-10-CM | POA: Diagnosis present

## 2022-03-21 DIAGNOSIS — Z87442 Personal history of urinary calculi: Secondary | ICD-10-CM

## 2022-03-21 DIAGNOSIS — Z7984 Long term (current) use of oral hypoglycemic drugs: Secondary | ICD-10-CM

## 2022-03-21 DIAGNOSIS — N390 Urinary tract infection, site not specified: Secondary | ICD-10-CM | POA: Diagnosis present

## 2022-03-21 LAB — URINALYSIS, COMPLETE (UACMP) WITH MICROSCOPIC
Bilirubin Urine: NEGATIVE
Glucose, UA: >=500 mg/dL — AB
Ketones, ur: 5 mg/dL — AB
Nitrite: NEGATIVE
Protein, ur: 30 mg/dL — AB
RBC / HPF: >50 RBC/hpf — ABNORMAL HIGH (ref 0–5)
Specific Gravity, Urine: >1.046 — ABNORMAL HIGH (ref 1.005–1.030)
pH: 8 (ref 5.0–8.0)

## 2022-03-21 LAB — CBC WITH DIFFERENTIAL/PLATELET
Abs Immature Granulocytes: 0.05 10*3/uL (ref 0.00–0.07)
Basophils Absolute: 0 10*3/uL (ref 0.0–0.1)
Basophils Relative: 1 %
Eosinophils Absolute: 0 10*3/uL (ref 0.0–0.5)
Eosinophils Relative: 0 %
HCT: 43.9 % (ref 36.0–46.0)
Hemoglobin: 14.4 g/dL (ref 12.0–15.0)
Immature Granulocytes: 1 %
Lymphocytes Relative: 28 %
Lymphs Abs: 2.3 10*3/uL (ref 0.7–4.0)
MCH: 29.8 pg (ref 26.0–34.0)
MCHC: 32.8 g/dL (ref 30.0–36.0)
MCV: 90.7 fL (ref 80.0–100.0)
Monocytes Absolute: 0.8 10*3/uL (ref 0.1–1.0)
Monocytes Relative: 9 %
Neutro Abs: 5 10*3/uL (ref 1.7–7.7)
Neutrophils Relative %: 61 %
Platelets: 169 10*3/uL (ref 150–400)
RBC: 4.84 MIL/uL (ref 3.87–5.11)
RDW: 13.1 % (ref 11.5–15.5)
WBC: 8.1 10*3/uL (ref 4.0–10.5)
nRBC: 0 % (ref 0.0–0.2)

## 2022-03-21 LAB — COMPREHENSIVE METABOLIC PANEL
ALT: 9 U/L (ref 0–44)
AST: 17 U/L (ref 15–41)
Albumin: 2.9 g/dL — ABNORMAL LOW (ref 3.5–5.0)
Alkaline Phosphatase: 40 U/L (ref 38–126)
Anion gap: 12 (ref 5–15)
BUN: 13 mg/dL (ref 8–23)
CO2: 22 mmol/L (ref 22–32)
Calcium: 8.1 mg/dL — ABNORMAL LOW (ref 8.9–10.3)
Chloride: 103 mmol/L (ref 98–111)
Creatinine, Ser: 0.77 mg/dL (ref 0.44–1.00)
GFR, Estimated: >60 mL/min (ref >60)
Glucose, Bld: 210 mg/dL — ABNORMAL HIGH (ref 70–99)
Potassium: 3.7 mmol/L (ref 3.5–5.1)
Sodium: 137 mmol/L (ref 135–145)
Total Bilirubin: 0.7 mg/dL (ref 0.3–1.2)
Total Protein: 6.3 g/dL — ABNORMAL LOW (ref 6.5–8.1)

## 2022-03-21 LAB — TSH: TSH: 2.38 u[IU]/mL (ref 0.350–4.500)

## 2022-03-21 LAB — BRAIN NATRIURETIC PEPTIDE: B Natriuretic Peptide: 25.1 pg/mL (ref 0.0–100.0)

## 2022-03-21 LAB — HIV ANTIBODY (ROUTINE TESTING W REFLEX): HIV Screen 4th Generation wRfx: NONREACTIVE

## 2022-03-21 LAB — MAGNESIUM: Magnesium: 1.9 mg/dL (ref 1.7–2.4)

## 2022-03-21 LAB — LACTIC ACID, PLASMA: Lactic Acid, Venous: 2.4 mmol/L (ref 0.5–1.9)

## 2022-03-21 LAB — TROPONIN I (HIGH SENSITIVITY): Troponin I (High Sensitivity): 4 ng/L (ref <18)

## 2022-03-21 LAB — VALPROIC ACID LEVEL: Valproic Acid Lvl: 65 ug/mL (ref 50.0–100.0)

## 2022-03-21 LAB — GLUCOSE, CAPILLARY: Glucose-Capillary: 181 mg/dL — ABNORMAL HIGH (ref 70–99)

## 2022-03-21 MED ORDER — ACETAMINOPHEN 650 MG RE SUPP: 650.0000 mg | Freq: Four times a day (QID) | RECTAL | Status: DC | PRN

## 2022-03-21 MED ORDER — ALBUTEROL SULFATE (2.5 MG/3ML) 0.083% IN NEBU: 2.5000 mg | INHALATION_SOLUTION | Freq: Four times a day (QID) | RESPIRATORY_TRACT | Status: DC | PRN

## 2022-03-21 MED ORDER — LOSARTAN POTASSIUM 50 MG PO TABS
100.0000 mg | ORAL_TABLET | Freq: Every day | ORAL | Status: DC
  Administered 2022-03-22 – 2022-03-23 (×2): 100 mg via ORAL
  Filled 2022-03-21 (×2): qty 2

## 2022-03-21 MED ORDER — ONDANSETRON HCL 4 MG PO TABS: 4.0000 mg | ORAL_TABLET | Freq: Four times a day (QID) | ORAL | Status: DC | PRN

## 2022-03-21 MED ORDER — IOHEXOL 300 MG/ML  SOLN
100.0000 mL | Freq: Once | INTRAMUSCULAR | Status: AC | PRN
  Administered 2022-03-21: 100 mL via INTRAVENOUS

## 2022-03-21 MED ORDER — LORAZEPAM 2 MG/ML IJ SOLN
2.0000 mg | INTRAMUSCULAR | Status: DC | PRN
Start: 2022-03-21 — End: 2022-03-21

## 2022-03-21 MED ORDER — INSULIN ASPART 100 UNIT/ML IJ SOLN: 0.0000 [IU] | Freq: Every day | INTRAMUSCULAR | Status: DC

## 2022-03-21 MED ORDER — ATORVASTATIN CALCIUM 20 MG PO TABS
40.0000 mg | ORAL_TABLET | Freq: Every day | ORAL | Status: DC
  Administered 2022-03-22 – 2022-03-23 (×2): 40 mg via ORAL
  Filled 2022-03-21 (×2): qty 2

## 2022-03-21 MED ORDER — DIVALPROEX SODIUM ER 500 MG PO TB24
1250.0000 mg | ORAL_TABLET | Freq: Every day | ORAL | Status: DC
  Administered 2022-03-22 – 2022-03-23 (×2): 1250 mg via ORAL
  Filled 2022-03-21 (×2): qty 1

## 2022-03-21 MED ORDER — ENOXAPARIN SODIUM 40 MG/0.4ML IJ SOSY
40.0000 mg | PREFILLED_SYRINGE | INTRAMUSCULAR | Status: DC
  Administered 2022-03-21 – 2022-03-22 (×2): 40 mg via SUBCUTANEOUS
  Filled 2022-03-21 (×2): qty 0.4

## 2022-03-21 MED ORDER — SODIUM CHLORIDE 0.9 % IV SOLN
2.0000 g | Freq: Three times a day (TID) | INTRAVENOUS | Status: DC
  Administered 2022-03-22 – 2022-03-23 (×4): 2 g via INTRAVENOUS
  Filled 2022-03-21: qty 2
  Filled 2022-03-21 (×4): qty 12.5

## 2022-03-21 MED ORDER — ALBUTEROL SULFATE HFA 108 (90 BASE) MCG/ACT IN AERS: 1.0000 | INHALATION_SPRAY | Freq: Four times a day (QID) | RESPIRATORY_TRACT | Status: DC | PRN

## 2022-03-21 MED ORDER — ONDANSETRON HCL 4 MG/2ML IJ SOLN
4.0000 mg | Freq: Four times a day (QID) | INTRAMUSCULAR | Status: DC | PRN
Start: 2022-03-21 — End: 2022-03-23

## 2022-03-21 MED ORDER — SODIUM CHLORIDE 0.9 % IV SOLN
INTRAVENOUS | Status: AC
  Administered 2022-03-21: 10 mL via INTRAVENOUS

## 2022-03-21 MED ORDER — SODIUM CHLORIDE 0.9% FLUSH
3.0000 mL | Freq: Two times a day (BID) | INTRAVENOUS | Status: DC
  Administered 2022-03-21 – 2022-03-22 (×3): 3 mL via INTRAVENOUS

## 2022-03-21 MED ORDER — POLYETHYLENE GLYCOL 3350 17 G PO PACK: 17.0000 g | PACK | Freq: Every day | ORAL | Status: DC | PRN

## 2022-03-21 MED ORDER — KETOROLAC TROMETHAMINE 30 MG/ML IJ SOLN
15.0000 mg | Freq: Once | INTRAMUSCULAR | Status: AC
  Administered 2022-03-21: 15 mg via INTRAVENOUS
  Filled 2022-03-21: qty 1

## 2022-03-21 MED ORDER — TAMSULOSIN HCL 0.4 MG PO CAPS
0.4000 mg | ORAL_CAPSULE | Freq: Every day | ORAL | Status: DC
  Administered 2022-03-22: 0.4 mg via ORAL
  Filled 2022-03-21 (×2): qty 1

## 2022-03-21 MED ORDER — INSULIN ASPART 100 UNIT/ML IJ SOLN
0.0000 [IU] | Freq: Three times a day (TID) | INTRAMUSCULAR | Status: DC
  Administered 2022-03-22: 7 [IU] via SUBCUTANEOUS
  Administered 2022-03-22: 4 [IU] via SUBCUTANEOUS
  Administered 2022-03-22: 7 [IU] via SUBCUTANEOUS
  Administered 2022-03-23: 4 [IU] via SUBCUTANEOUS
  Filled 2022-03-21 (×4): qty 1

## 2022-03-21 MED ORDER — FLUTICASONE PROPIONATE 50 MCG/ACT NA SUSP
2.0000 | Freq: Every day | NASAL | Status: DC | PRN
  Filled 2022-03-21: qty 16

## 2022-03-21 MED ORDER — MORPHINE SULFATE (PF) 2 MG/ML IV SOLN
2.0000 mg | INTRAVENOUS | Status: DC | PRN
  Administered 2022-03-22: 2 mg via INTRAVENOUS
  Filled 2022-03-21: qty 1

## 2022-03-21 MED ORDER — VANCOMYCIN HCL 1750 MG/350ML IV SOLN
1750.0000 mg | Freq: Once | INTRAVENOUS | Status: AC
  Administered 2022-03-21: 1750 mg via INTRAVENOUS
  Filled 2022-03-21: qty 350

## 2022-03-21 MED ORDER — DICLOFENAC SODIUM 1 % EX GEL
4.0000 g | Freq: Four times a day (QID) | CUTANEOUS | Status: DC
  Administered 2022-03-21 – 2022-03-22 (×2): 4 g via TOPICAL
  Filled 2022-03-21: qty 100

## 2022-03-21 MED ORDER — LORAZEPAM 2 MG/ML IJ SOLN
2.0000 mg | INTRAMUSCULAR | Status: DC | PRN
Start: 2022-03-21 — End: 2022-03-23

## 2022-03-21 MED ORDER — ACETAMINOPHEN 325 MG PO TABS: 650.0000 mg | ORAL_TABLET | Freq: Four times a day (QID) | ORAL | Status: DC | PRN

## 2022-03-21 MED ORDER — VANCOMYCIN HCL 1250 MG/250ML IV SOLN: 1250.0000 mg | INTRAVENOUS | Status: DC

## 2022-03-21 MED ORDER — LACTATED RINGERS IV BOLUS
1000.0000 mL | Freq: Once | INTRAVENOUS | Status: AC
Start: 2022-03-21 — End: 2022-03-21
  Administered 2022-03-21: 1000 mL via INTRAVENOUS

## 2022-03-21 MED ORDER — KETOROLAC TROMETHAMINE 15 MG/ML IJ SOLN
15.0000 mg | Freq: Four times a day (QID) | INTRAMUSCULAR | Status: AC | PRN
  Administered 2022-03-22 (×3): 15 mg via INTRAVENOUS
  Filled 2022-03-21 (×3): qty 1

## 2022-03-21 MED ORDER — SODIUM CHLORIDE 0.9 % IV SOLN
1.0000 g | Freq: Once | INTRAVENOUS | Status: AC
  Administered 2022-03-21: 1 g via INTRAVENOUS
  Filled 2022-03-21: qty 10

## 2022-03-21 NOTE — Progress Notes (Signed)
MRSA swab collected.

## 2022-03-21 NOTE — ED Provider Notes (Signed)
? ?[facility] ?Provider Note ? ? ? Event Date/Time  ? First MD Initiated Contact with Patient 03/21/22 1329   ?  (approximate) ? ? ?History  ? ?Loss of Consciousness ? ? ?HPI ? ?Crystal Foley is a 71 y.o. female with a past medical history of DM, epilepsy and COPD as well as kidney stones followed by urology most recently seen yesterday for a follow-up visit for work-up of hematuria for follow-up of most recent stone passage in 2021 who presents coming by husband via EMS for evaluation of syncopal episode.  Patient was on the toilet having a bowel movement straining quite hard.  She remembers feeling lightheaded and dizzy and her husband came in.  Her husband states that at 1 point she lost consciousness and her eyes rolled the back of her head briefly and she slumped over and he caught her.  She did not fall or hit her head.  She does not remember exactly what happened.  She was back to baseline within a few seconds.  There was no generalized shaking and overall patient and husband do not feel this was a seizure.  She has been compliant with her seizure medicines.  She states that shortly after this she started developing some right-sided flank pain.  She has not had any recent or subsequent headache, earache, sore throat, chest pain, cough, shortness of breath, fevers, back pain, left-sided abdominal pain or flank pain, burning with urination rash or extremity pain.  No recent falls or injuries.  She denies tobacco abuse, EtOH use or illicit drug use.  Denies any other acute concerns at this time. ? ?  ? ? ?Physical Exam  ?Triage Vital Signs: ?ED Triage Vitals  ?Enc Vitals Group  ?   BP 03/21/22 1332 131/69  ?   Pulse Rate 03/21/22 1332 76  ?   Resp 03/21/22 1332 16  ?   Temp 03/21/22 1332 98.7 ?F (37.1 ?C)  ?   Temp Source 03/21/22 1332 Oral  ?   SpO2 03/21/22 1332 99 %  ?   Weight 03/21/22 1330 165 lb 5.5 oz (75 kg)  ?   Height 03/21/22 1330 '5\' 4"'$  (1.626 m)  ?   Head Circumference --   ?    Peak Flow --   ?   Pain Score 03/21/22 1334 3  ?   Pain Loc --   ?   Pain Edu? --   ?   Excl. in [place]? --   ? ? ?Most recent vital signs: ?Vitals:  ? 03/21/22 1400 03/21/22 1430  ?BP: (!) 150/70 (!) 154/76  ?Pulse: 77 81  ?Resp: 13 19  ?Temp:    ?SpO2: 98% 98%  ? ? ?General: Awake, no distress.  ?CV:  Slightly prolonged capillary refill in the digits.  No significant murmur..  2+ radial pulses. ?Resp:  Normal effort.  Clear bilaterally. ?Abd:  No distention.  Some mild tenderness along the right flank.  No overlying skin changes.  No CVA tenderness.  No left-sided abdominal tenderness or any left-sided CVA tenderness. ?Other:  Patient has cranial nerves II through XII grossly intact and symmetric strength in all extremities.  Sensation is intact light touch throughout all extremities. ? ? ?ED Results / Procedures / Treatments  ?Labs ?(all labs ordered are listed, but only abnormal results are displayed) ?Labs Reviewed  ?COMPREHENSIVE METABOLIC PANEL - Abnormal; Notable for the following components:  ?    Result Value  ? Glucose, Bld 210 (*)   ?  Calcium 8.1 (*)   ? Total Protein 6.3 (*)   ? Albumin 2.9 (*)   ? All other components within normal limits  ?CBC WITH DIFFERENTIAL/PLATELET  ?MAGNESIUM  ?VALPROIC ACID LEVEL  ?URINALYSIS, COMPLETE (UACMP) WITH MICROSCOPIC  ? ? ? ?EKG ? ?ECG is remarkable for sinus rhythm with a ventricular rate of 81, normal axis, unremarkable intervals without evidence of acute ischemia or significant arrhythmia ? ?RADIOLOGY ? ? ? ?PROCEDURES: ? ?Critical Care performed: No ? ?.1-3 Lead EKG Interpretation ?Performed by: Lucrezia Starch, MD ?Authorized by: Lucrezia Starch, MD  ? ?  Interpretation: normal   ?  ECG rate assessment: normal   ?  Rhythm: sinus rhythm   ?  Ectopy: none   ?  Conduction: normal   ? ?The patient is on the cardiac monitor to evaluate for evidence of arrhythmia and/or significant heart rate changes. ? ? ?MEDICATIONS ORDERED IN ED: ?Medications  ?lactated ringers bolus  1,000 mL (1,000 mLs Intravenous New Bag/Given 03/21/22 1352)  ?ketorolac (TORADOL) 30 MG/ML injection 15 mg (15 mg Intravenous Given 03/21/22 1511)  ?iohexol (OMNIPAQUE) 300 MG/ML solution 100 mL (100 mLs Intravenous Contrast Given 03/21/22 1448)  ? ? ? ?IMPRESSION / MDM / ASSESSMENT AND PLAN / ED COURSE  ?I reviewed the triage vital signs and the nursing notes. ?             ?               ? ?Differential diagnosis includes, but is not limited to vasovagal syncope given that seems to have started while patient was bearing down during bowel movement, dehydration, arrhythmia, anemia, metabolic derangements with a lower suspicion at this time for a PE or acute cardiomyopathy given absence of any associated chest pain, shortness of breath, significant murmur or any abnormal vital signs.  She has a nonfocal neurological exam but is not suggestive of a stroke.  The entire episode seems to be witnessed by her husband and both patient has been do not think this was a seizure and have a lower suspicion for this at this time. ? ?ECG is remarkable for sinus rhythm with a ventricular rate of 81, normal axis, unremarkable intervals without evidence of acute ischemia or significant arrhythmia. ? ?CMP shows a glucose of 210 without any other significant electrolyte or metabolic derangements although patient's albumin is noted to be on the low side at 2.9.  CBC without leukocytosis or acute anemia.  Magnesium is within normal limits.  Depakote level is therapeutic. ? ?Patient signed over to assuming father approximately 1500.  Plan is to follow-up CT abdomen pelvis obtained to check for evidence of ureteral stone, pyelonephritis, diverticulitis, AAA or other acute abdominal or pelvic process could be contributing to presentation today.  In addition patient will need a urinalysis obtained. ? ?If these are all unrevealing patient is otherwise feeling better and able to ambulate and is not orthostatic and I think she can safely be  discharged home with outpatient follow-up. ? ? ?  ? ? ?FINAL CLINICAL IMPRESSION(S) / ED DIAGNOSES  ? ?Final diagnoses:  ?Syncope, unspecified syncope type  ?Right flank pain  ? ? ? ?Rx / DC Orders  ? ?ED Discharge Orders   ? ? None  ? ?  ? ? ? ?Note:  This document was prepared using Dragon voice recognition software and may include unintentional dictation errors. ?  ?Lucrezia Starch, MD ?03/21/22 1518 ? ?

## 2022-03-21 NOTE — Progress Notes (Signed)
Pharmacy Antibiotic Note ? ?Crystal Foley is a 71 y.o. female admitted on 03/21/2022 with sepsis / increased lactic acid with syncope.  Pharmacy has been consulted for Cefepime and Vancomycin dosing. ? ?Plan: ?Cefepime 2 gm q8h per indication & renal fxn ? ?Vancomycin 1750 mg x 1 dose ?Vancomycin 1250 mg IV Q 24 hrs.  ?Goal AUC 400-550. ?Expected AUC: 451.2 ?SCr used: 0.8 (Measured SCr = 0.77 on 4/6) ? ?Pharmacy will continue to follow and will adjust abx dosing whenever warranted. ? ? ?Height: '5\' 4"'$  (162.6 cm) ?Weight: 75 kg (165 lb 5.5 oz) ?IBW/kg (Calculated) : 54.7 ? ?Temp (24hrs), Avg:98.6 ?F (37 ?C), Min:98.5 ?F (36.9 ?C), Max:98.7 ?F (37.1 ?C) ? ?Recent Labs  ?Lab 03/21/22 ?1333 03/21/22 ?2052  ?WBC 8.1  --   ?CREATININE 0.77  --   ?LATICACIDVEN  --  2.4*  ?  ?Estimated Creatinine Clearance: 64.9 mL/min (by C-G formula based on SCr of 0.77 mg/dL).   ? ?Allergies  ?Allergen Reactions  ? Dilantin [Phenytoin Sodium Extended] Swelling  ? Metformin Diarrhea  ? Phenytoin Swelling  ? ? ?Antimicrobials this admission: ?4/06 Ceftriaxone >> x 1 dose ?4/06 Vancomycin >>  ?4/07 Cefepime >> ? ?Microbiology results: ?4/06 BCx: Pending ?4/06 UCx: Pending  ? ?Thank you for allowing pharmacy to be a part of this patient?s care. ? ?Renda Rolls, PharmD, MBA ?03/22/2022 ?4:28 AM ? ? ?

## 2022-03-21 NOTE — Assessment & Plan Note (Signed)
-   UTI present on admission ?- Possible sepsis secondary to UTI ?- Treat as above ?

## 2022-03-21 NOTE — Assessment & Plan Note (Signed)
-   Non-insulin-dependent diabetes mellitus type 2 ?- Home glipizide and glimepiride have not been resumed ?- Insulin SSI with agents coverage ordered ?- Goal inpatient blood glucose levels 140-180 ?

## 2022-03-21 NOTE — Assessment & Plan Note (Signed)
-   Insulin SSI with at bedtime coverage as above ?

## 2022-03-21 NOTE — Progress Notes (Signed)
Blood Sugar 181 at 2230 ?

## 2022-03-21 NOTE — Assessment & Plan Note (Addendum)
-   Etiology work-up in progress ?- Check lactic acid, blood cultures x2, high sensitive troponin, BNP, TSH ?- Added urine culture to prior collection ?

## 2022-03-21 NOTE — Progress Notes (Signed)
Vital signs on arrival to the floor/ admission ? ?Orthostatic Vital Signs below (Lying, Sitting, Standing) ? ? 03/21/22 2155  ?Vitals  ?Temp 98.5 ?F (36.9 ?C)  ?Temp Source Oral  ?BP (!) 184/75  ?MAP (mmHg) 104  ?BP Location Right Arm  ?BP Method Automatic  ?Patient Position (if appropriate) Lying  ?Pulse Rate 87  ?Pulse Rate Source Monitor  ?Resp 18  ?MEWS COLOR  ?MEWS Score Color Green  ?Orthostatic Lying   ?BP- Lying 184/75  ?Pulse- Lying 87  ?Orthostatic Sitting  ?BP- Sitting 158/83  ?Pulse- Sitting 92  ?Orthostatic Standing at 0 minutes  ?BP- Standing at 0 minutes 152/85  ?Pulse- Standing at 0 minutes 107  ?Oxygen Therapy  ?SpO2 98 %  ?MEWS Score  ?MEWS Temp 0  ?MEWS Systolic 0  ?MEWS Pulse 0  ?MEWS RR 0  ?MEWS LOC 0  ?MEWS Score 0  ? ? ?

## 2022-03-21 NOTE — ED Triage Notes (Signed)
Husband at bedside, reports pt was on toilet and called to him asking for water stating she was dizzy, he brought her water, pt stated she wanted a cold washcloth since she was hot and felt dizzy. Pt then rolled her eyes back and passed out, lasted between 1-2 minutes, husband caught pt and kept her from hitting her head or anything. Pt came back to, husband pulled the call cord in [facility] at [facility] and staff came to assess. After pt was removed from toilet and staff was at bedside, husband reports pt passed out for a few seconds approximately 4 more times.  ?

## 2022-03-21 NOTE — H&P (Addendum)
History and Physical   Crystal Foley:[id number] DOB: 1950-12-30 DOA: 03/21/2022  PCP: Lauro Regulus, MD  Outpatient Specialists: Dr. [facility] Patient coming from: Home  I have personally briefly reviewed patient's old medical records in [facility] EMR.  Chief Concern: Syncope  HPI: [name] is a 71 year old female with history of hypertension, hyperlipidemia, non-insulin-dependent diabetes mellitus on oral antihyperglycemic agents, who presents emergency department for chief concerns of syncope.  Initial vitals in the emergency department showed temperature of 98.7, respiration rate of 13, heart rate 77, blood pressure 150/70, SPO2 of 98% on room air.  Serum sodium 137, potassium 3.7, chloride 103, bicarb 22, BUN of 13, serum creatinine of 0.77, GFR greater than 60, nonfasting blood glucose 210, WBC 8.1, hemoglobin 14.4, platelets of 169.  UA: Was positive for large leukocytes.  Valproic acid level was 65.  CT abdomen pelvis with contrast: Was read as mild to moderate right sided hydroureteronephrosis, due to proximal right urethral calculus just beyond the right UPJ, dilatation involving renal pelvis and infundibular elements more so than calyces, mildly confounded by presence of large right renal cysts.  Calculus appears to have been present since May 2021, is associated with mild diffuse urothelial enhancement which may be due to chronic irritation.  Bilateral nephrolithiasis otherwise similar to prior study.  Hepatic steatosis.  Abundant stool in the rectosigmoid colon.  Aortic atherosclerosis.  Per EDP who discussed with Dr. Richardo Hanks who states that the urethral calculus is not contributing to her syncope and does not recommend further inpatient follow-up from a urology standpoint.  ED treatment: Toradol 15 mg IV, ceftriaxone 1 g, LR 1 L bolus.  At bedside, she is able to tell me her name, age, current year, and her husband.  She does not  appear to be in acute distress.  She reports that while she was having a bowel movement at approximately noon, the initial bowel movement was normal and well formed without difficulty.  The second bowel movement she had some straining.  She endorsed feeling dizzy and thirsty and asked her husband to bring her some water.  Per her husband, patient's eyes rolled back and she lost consciousness and passed out several times while on the toilet.  This prompted family to call EMS to bring her to the emergency department for further evaluation.  She reports this is never happened before.  She denies chest pain, shortness of breath, abdominal pain, diarrhea, nausea, vomiting, fever, new cough, chills.  She endorses dysuria and burning with urination for the last couple of days.   Social history: She lives with her husband. She denies tobacco, etoh, and recreational drug use. She is retired and formerly worked as a Pharmacologist.  Vaccination history: She is vaccinated for influenza and covid, first two doses.  ROS: Constitutional: no weight change, no fever ENT/Mouth: no sore throat, no rhinorrhea Eyes: no eye pain, no vision changes Cardiovascular: no chest pain, no dyspnea,  no edema, no palpitations Respiratory: no cough, no sputum, no wheezing Gastrointestinal: no nausea, no vomiting, no diarrhea, no constipation Genitourinary: no urinary incontinence, + dysuria, no hematuria, + urinary urgency Musculoskeletal: no arthralgias, no myalgias Skin: no skin lesions, no pruritus, Neuro: + weakness, + loss of consciousness, + syncope Psych: no anxiety, no depression, no decrease appetite Heme/Lymph: no bruising, no bleeding  ED Course: Discussed with emergency medicine provider, patient requiring hospitalization for chief concerns of syncope.  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)   Elevated lactic acid level   Sepsis secondary to UTI (HCC)   Assessment and Plan:  * Syncope - Etiology work-up in progress - Check lactic acid, blood cultures x2, high sensitive troponin, BNP, TSH - Added urine culture to prior collection  Sepsis secondary to UTI [facility]) - UTI present on admission - Possible sepsis secondary to UTI - Treat as above  Elevated lactic acid level - Does not meet the definition of sepsis as her vitals are otherwise within normal limits - However given elevated lactic acid, syncope, and a possible source of urinary, I have ordered for broad-spectrum antibiotic - Patient is status post LR 1 L bolus per EDP - Patient does not appear septic clinically at this time - Ordered sodium chloride IVF at 150 mL/h, 1 day ordered - Blood cultures x2 collected and are in process, urine culture collected and is in process  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) - Insulin SSI with at bedtime coverage as above  Pure hypercholesterolemia - Atorvastatin 40 mg daily resumed  Diabetes mellitus, type 2 (HCC) - Non-insulin-dependent diabetes mellitus type 2 - Home glipizide and glimepiride have not been resumed - Insulin SSI with agents coverage ordered - Goal inpatient blood glucose levels 140-180  Seizure (HCC) - Patient is prescribed Depakote to 50 mg tablet, 500 mg in the morning and 750 in the evening, however 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 - Depakote 1250 mg daily in the morning resumed - Ativan 2 mg IV as needed for seizure, 3 doses ordered with instructions to administer medication and then let provider know  HLD (hyperlipidemia) - Atorvastatin 40 mg daily  resumed  Benign hypertension - Patient takes losartan 100 mg daily at home  Chart reviewed.   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)    Hematuria, gross    Hydronephrosis    Hypercholesterolemia    Hypertension    Hypothyroidism    Nephrolithiasis    Nephrolithiasis    Seizures (HCC)    last seizure 7-8 yrs ago   Shortness of breath dyspnea    with exertion   Past Surgical History:  Procedure Laterality Date   Bladder tack     COLONOSCOPY     COLONOSCOPY WITH PROPOFOL N/A 03/25/2018   Procedure: COLONOSCOPY WITH PROPOFOL;  Surgeon: Toledo, [name], MD;  Location: [facility] ENDOSCOPY;  Service: Gastroenterology;  Laterality: N/A;   COLONOSCOPY WITH PROPOFOL N/A 05/13/2018   Procedure: COLONOSCOPY WITH PROPOFOL;  Surgeon: Toledo, [name], MD;  Location: [facility] ENDOSCOPY;  Service: Gastroenterology;  Laterality: N/A;   CYSTOSCOPY W/ URETERAL STENT PLACEMENT  03/11/15   CYSTOSCOPY WITH RETROGRADE PYELOGRAM, URETEROSCOPY AND STENT PLACEMENT  Left 03/17/2015   Procedure: CYSTOSCOPY, STENT REMOVAL, LEFT URETEROSCOPY  WITH STONE REMOVAL WITH BASKET;  Surgeon: Bjorn Pippin, MD;  Location: [facility] ORS;  Service: Urology;  Laterality: Left;   SHOULDER ARTHROSCOPY DISTAL CLAVICLE EXCISION AND OPEN ROTATOR CUFF REPAIR     TUBAL LIGATION     Social History:  reports that she has never smoked. She has never used smokeless tobacco. She reports that she does not drink alcohol and does not use drugs.  Allergies   Allergen Reactions   Dilantin [Phenytoin Sodium Extended] Swelling   Metformin Diarrhea   Phenytoin Swelling   Family History  Problem Relation Age of Onset   Coronary artery disease Sister    Heart disease Sister    Brain cancer Father    Emphysema Mother    Family history: Family history reviewed and not pertinent.  Prior to Admission medications   Medication Sig Start Date End Date Taking? Authorizing Provider  albuterol (PROVENTIL HFA;VENTOLIN HFA) 108 (90 BASE) MCG/ACT inhaler Inhale 1 puff into the lungs every 6 (six) hours as needed for wheezing or shortness of breath.    [provider]  atorvastatin (LIPITOR) 40 MG tablet Take 40 mg by mouth daily.    [provider]  canagliflozin (INVOKANA) 300 MG TABS tablet Take by mouth. Patient not taking: Reported on 03/20/2022 03/01/19   [provider]  clotrimazole-betamethasone (LOTRISONE) cream Apply 1 application topically 2 (two) times daily. 12/29/19   Michiel Cowboy A, PA-C  diclofenac sodium (VOLTAREN) 1 % GEL Apply 4 g topically 4 (four) times daily.    [provider]  divalproex (DEPAKOTE ER) 250 MG 24 hr tablet Take 500-750 mg by mouth 2 (two) times daily. Take 500mg  in morning and take 750mg  at night    [provider]  estradiol (ESTRACE VAGINAL) 0.1 MG/GM vaginal cream Apply 0.5mg  (pea-sized amount)  just inside the vaginal introitus with a finger-tip on Monday, Wednesday and Friday nights. 03/20/22   McGowan, Carollee Herter A, PA-C  fluticasone (FLONASE) 50 MCG/ACT nasal spray Place 2 sprays into both nostrils daily as needed for allergies or rhinitis.    [provider]  glimepiride (AMARYL) 2 MG tablet TAKE 1 TABLET DAILY WITH  BREAKFAST 03/20/20   [provider]  glipiZIDE (GLUCOTROL) 5 MG tablet Take 5 mg by mouth daily. 05/02/21   [provider]  losartan (COZAAR) 100 MG tablet Take by mouth. 06/20/20 06/20/21  [provider]  losartan (COZAAR) 100  MG tablet Take 1 tablet by mouth daily. 07/04/21   [provider]  RYBELSUS 7 MG TABS Take 1 tablet by mouth daily. 06/21/20   [provider]  tamsulosin (FLOMAX) 0.4 MG CAPS capsule TAKE 1 CAPSULE BY MOUTH EVERY DAY 05/26/20   McGowan, Carollee Herter A, PA-C  Trospium Chloride 60 MG CP24 Take 1 capsule (60 mg total) by mouth daily. 03/20/22   Harle Battiest, PA-C   Physical Exam: Vitals:   03/21/22 1700 03/21/22 1730 03/21/22 1920 03/21/22 2155  BP: (!) 143/72 (!) 150/68 (!) 146/82 (!) 184/75  Pulse:  90 80 87  Resp: 18 20 20 18   Temp:    98.5 F (36.9 C)  TempSrc:    Oral  SpO2: 99% 98% 100% 98%  Weight:      Height:       Constitutional: appears age-appropriate, NAD, calm, comfortable Eyes: PERRL, lids and conjunctivae normal ENMT: Mucous membranes are moist. Posterior pharynx clear of any exudate or lesions. Age-appropriate dentition. Hearing  appropriate Neck: normal, supple, no masses, no thyromegaly Respiratory: clear to auscultation bilaterally, no wheezing, no crackles. Normal respiratory effort. No accessory muscle use.  Cardiovascular: Regular rate and rhythm, no murmurs / rubs / gallops. No extremity edema. 2+ pedal pulses. No carotid bruits.  Abdomen: no tenderness, no masses palpated, no hepatosplenomegaly. Bowel sounds positive.  Musculoskeletal: no clubbing / cyanosis. No joint deformity upper and lower extremities. Good ROM, no contractures, no atrophy. Normal muscle tone.  Skin: no rashes, lesions, ulcers. No induration Neurologic: Sensation intact. Strength 5/5 in all 4.  Psychiatric: Normal judgment and insight. Alert and oriented x 3. Normal mood.   EKG: independently reviewed, showing sinus rhythm with rate of 81, QTc 440  Chest x-ray on Admission: I personally reviewed and I agree with radiologist reading as below.  X-ray chest PA and lateral  Result Date: 03/21/2022 CLINICAL DATA:  Syncopal episode. EXAM: CHEST - 2 VIEW COMPARISON:  PA Lat  05/30/2021. FINDINGS: The heart size and mediastinal contours are stable with aortic atherosclerosis and tortuosity, normal cardiac size. The lungs are clear of focal infiltrates with old left perihilar linear scarring again shown. No pleural effusion is seen. There is osteopenia, mild thoracic kyphosis, mild thoracic spondylosis. Mild chronic anterior wedge compression fracture deformity T6 vertebral body. IMPRESSION: No active cardiopulmonary disease. Stable chest with chronic change. Aortic atherosclerosis. Electronically Signed   By: Almira Bar M.D.   On: 03/21/2022 20:52   Abdomen 1 view (KUB)  Result Date: 03/20/2022 CLINICAL DATA:  Nephrolithiasis EXAM: ABDOMEN - 1 VIEW COMPARISON:  03/22/2021, CT 05/02/2020 FINDINGS: A 3 mm calculus overlies the interpolar region of the right kidney. Cluster calculi overlie the expected upper pole of the left kidney measuring up to 5 mm. No definite ureteral calculi. 9 mm rounded calcification adjacent to the left L5 transverse process is in keeping with a diverticular calcification. Normal abdominal gas pattern. No organomegaly. IMPRESSION: Mild bilateral nephrolithiasis.  No urolithiasis. Electronically Signed   By: Helyn Numbers M.D.   On: 03/20/2022 21:55   CT ABDOMEN PELVIS W CONTRAST  Result Date: 03/21/2022 CLINICAL DATA:  A 71 year old female presents for evaluation of acute abdominal pain with flank pain, suspected kidney stones. EXAM: CT ABDOMEN AND PELVIS WITH CONTRAST TECHNIQUE: Multidetector CT imaging of the abdomen and pelvis was performed using the standard protocol following bolus administration of intravenous contrast. RADIATION DOSE REDUCTION: This exam was performed according to the departmental dose-optimization program which includes automated exposure control, adjustment of the mA and/or kV according to patient size and/or use of iterative reconstruction technique. CONTRAST:  OMNIPAQUE IOHEXOL 300 MG/ML  SOLN COMPARISON:  May 02, 2020.  FINDINGS: Lower chest: Basilar atelectasis and scarring. No effusion. No consolidative changes. Hepatobiliary: Hepatic steatosis. No focal, suspicious hepatic lesion, pericholecystic stranding or biliary duct distension. Portal vein is patent. Pancreas: Normal, without mass, inflammation or ductal dilatation. Spleen: Normal. Adrenals/Urinary Tract: Adrenal glands are normal. Signs of large renal sinus cysts without hydronephrosis on the LEFT, similar to previous imaging splaying the renal sinuses bilaterally. Small RIGHT ureteral calculus (image 46/2) mild urothelial enhancement along the course of the RIGHT ureter. This calcification was present in fact dating back to the previous exam and is not changed. There is mild RIGHT hydroureteronephrosis and some delayed excretion but with similar enhancement of RIGHT as compared to LEFT kidney. Diffuse mild urothelial enhancement is demonstrated on the RIGHT which may relate to longstanding presence of this calculus. Renal pelvis dilated up to 3 cm collecting system  elements show less dilation elsewhere with mild distension of the infundibular collecting system elements and without substantial dilation of caliceal elements. Urinary bladder without adjacent stranding. No LEFT ureteral calculi. Bilateral nephrolithiasis otherwise similar to the prior study largest calculus in the LEFT upper pole measuring 4-5 mm. No dedicated follow-up recommended for renal sinus cysts. Stomach/Bowel: Abundant stool in the rectosigmoid colon. No pericolonic stranding. No signs of bowel obstruction. Appendix is normal stomach mild-to-moderately distended with fluid. Vascular/Lymphatic: Aortic atherosclerosis. No sign of aneurysm. Smooth contour of the IVC. There is no gastrohepatic or hepatoduodenal ligament lymphadenopathy. No retroperitoneal or mesenteric lymphadenopathy. No pelvic sidewall lymphadenopathy. Reproductive: Unremarkable by CT.  No adnexal masses. Other: No ascites.  No  pneumoperitoneum. Musculoskeletal: No acute bone finding. No destructive bone process. Spinal degenerative changes. IMPRESSION: 1. Mild to moderate RIGHT-sided hydroureteronephrosis, due to proximal RIGHT ureteral calculus just beyond the RIGHT UPJ, dilation involving renal pelvis and infundibular elements more so than calyces, mildly confounded by presence of large RIGHT renal cysts. 2. Calculus appears to have been present since May of 2021 and is associated with mild diffuse urothelial enhancement which may be due to chronic irritation. 3. Bilateral nephrolithiasis otherwise similar to the prior study. 4. Hepatic steatosis. 5. Abundant stool in the rectosigmoid colon. Correlate with any symptoms of constipation. 6. Aortic atherosclerosis. Aortic Atherosclerosis (ICD10-I70.0). Electronically Signed   By: Donzetta Kohut M.D.   On: 03/21/2022 15:18    Labs on Admission: I have personally reviewed following labs  CBC: Recent Labs  Lab 03/21/22 1333  WBC 8.1  NEUTROABS 5.0  HGB 14.4  HCT 43.9  MCV 90.7  PLT 169   Basic Metabolic Panel: Recent Labs  Lab 03/21/22 1333  NA 137  K 3.7  CL 103  CO2 22  GLUCOSE 210*  BUN 13  CREATININE 0.77  CALCIUM 8.1*  MG 1.9   GFR: Estimated Creatinine Clearance: 64.9 mL/min (by C-G formula based on SCr of 0.77 mg/dL).  Liver Function Tests: Recent Labs  Lab 03/21/22 1333  AST 17  ALT 9  ALKPHOS 40  BILITOT 0.7  PROT 6.3*  ALBUMIN 2.9*   Urine analysis:    Component Value Date/Time   COLORURINE YELLOW (A) 03/21/2022 1749   APPEARANCEUR HAZY (A) 03/21/2022 1749   APPEARANCEUR Cloudy (A) 03/20/2022 0834   LABSPEC >1.046 (H) 03/21/2022 1749   LABSPEC 1.028 03/11/2015 2310   PHURINE 8.0 03/21/2022 1749   GLUCOSEU >=500 (A) 03/21/2022 1749   GLUCOSEU >=500 03/11/2015 2310   HGBUR LARGE (A) 03/21/2022 1749   BILIRUBINUR NEGATIVE 03/21/2022 1749   BILIRUBINUR Negative 03/20/2022 0834   BILIRUBINUR Negative 03/11/2015 2310   KETONESUR 5  (A) 03/21/2022 1749   PROTEINUR 30 (A) 03/21/2022 1749   UROBILINOGEN 1.0 09/19/2012 1920   NITRITE NEGATIVE 03/21/2022 1749   LEUKOCYTESUR LARGE (A) 03/21/2022 1749   LEUKOCYTESUR Negative 03/11/2015 2310   CRITICAL CARE Performed by: Nadyne Coombes Jahree Dermody  Total critical care time: 35 minutes  Critical care time was exclusive of separately billable procedures and treating other patients.  Critical care was necessary to treat or prevent imminent or life-threatening deterioration.  Critical care was time spent personally by me on the following activities: development of treatment plan with patient and/or surrogate as well as nursing, discussions with consultants, evaluation of patient's response to treatment, examination of patient, obtaining history from patient or surrogate, ordering and performing treatments and interventions, ordering and review of laboratory studies, ordering and review of radiographic studies, pulse oximetry and  re-evaluation of patient's condition.  Dr. Sedalia Muta [facility]  If 7PM-7AM, please contact overnight-coverage provider If 7AM-7PM, please contact day coverage provider [email or web address]  03/21/2022, 10:41 PM

## 2022-03-21 NOTE — ED Notes (Signed)
Pt taken to CT at this time.

## 2022-03-21 NOTE — Assessment & Plan Note (Signed)
-   Atorvastatin 40 mg daily resumed 

## 2022-03-21 NOTE — ED Triage Notes (Signed)
From home via [facility]. Per medic, pt was on the toilet, husband went to get pt water d/t pt calling out saying she needed water and didn't feel well. Medic unsure if pt's husband witnessed event but states pt did not hit head, states husband was holding pt up and pt was diaphoretic and cold when they arrived. Pt cannot remember event. Pt denies blood thinners, state head doesn't hurt like she hit it.  ? ?Pt went to urologist yesterday for chronic pain to see if she had another kidney stone and had tests performed ? ?BGL 249 ?126/66 ?78 HR ?97% RA ?RR 16 ?

## 2022-03-21 NOTE — Progress Notes (Signed)
Handoff with the ED Nurse, Baron Sane, RN ?Patient arrived on the floor. Vital signs taken.  ?

## 2022-03-21 NOTE — Assessment & Plan Note (Signed)
-   Does not meet the definition of sepsis as her vitals are otherwise within normal limits ?- However given elevated lactic acid, syncope, and a possible source of urinary, I have ordered for broad-spectrum antibiotic ?- Patient is status post LR 1 L bolus per EDP ?- Patient does not appear septic clinically at this time ?- Ordered sodium chloride IVF at 150 mL/h, 1 day ordered ?- Blood cultures x2 collected and are in process, urine culture collected and is in process ?

## 2022-03-21 NOTE — Hospital Course (Addendum)
[name] is a 71 year old female with history of hypertension, hyperlipidemia, non-insulin-dependent diabetes mellitus on oral antihyperglycemic agents, who presents emergency department for chief concerns of syncope. ? ?Initial vitals in the emergency department showed temperature of 98.7, respiration rate of 13, heart rate 77, blood pressure 150/70, SPO2 of 98% on room air. ? ?Serum sodium 137, potassium 3.7, chloride 103, bicarb 22, BUN of 13, serum creatinine of 0.77, GFR greater than 60, nonfasting blood glucose 210, WBC 8.1, hemoglobin 14.4, platelets of 169. ? ?UA: Was positive for large leukocytes. ? ?Valproic acid level was 65. ? ?CT abdomen pelvis with contrast: Was read as mild to moderate right sided hydroureteronephrosis, due to proximal right urethral calculus just beyond the right UPJ, dilatation involving renal pelvis and infundibular elements more so than calyces, mildly confounded by presence of large right renal cysts.  Calculus appears to have been present since May 2021, is associated with mild diffuse urothelial enhancement which may be due to chronic irritation.  Bilateral nephrolithiasis otherwise similar to prior study.  Hepatic steatosis.  Abundant stool in the rectosigmoid colon.  Aortic atherosclerosis. ? ?Per EDP who discussed with Dr. Diamantina Providence who states that the urethral calculus is not contributing to her syncope and does not recommend further inpatient follow-up from a urology standpoint. ? ?ED treatment: Toradol 15 mg IV, ceftriaxone 1 g, LR 1 L bolus. ?

## 2022-03-21 NOTE — Assessment & Plan Note (Signed)
-   Patient takes losartan 100 mg daily at home ?

## 2022-03-21 NOTE — Assessment & Plan Note (Addendum)
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 ?- Depakote 1250 mg daily in the morning resumed ?- Ativan 2 mg IV as needed for seizure, 3 doses ordered with instructions to administer medication and then let provider know ?

## 2022-03-22 DIAGNOSIS — N3000 Acute cystitis without hematuria: Secondary | ICD-10-CM | POA: Diagnosis not present

## 2022-03-22 DIAGNOSIS — E039 Hypothyroidism, unspecified: Secondary | ICD-10-CM | POA: Diagnosis present

## 2022-03-22 DIAGNOSIS — E1165 Type 2 diabetes mellitus with hyperglycemia: Secondary | ICD-10-CM | POA: Diagnosis present

## 2022-03-22 DIAGNOSIS — E1122 Type 2 diabetes mellitus with diabetic chronic kidney disease: Secondary | ICD-10-CM | POA: Diagnosis present

## 2022-03-22 DIAGNOSIS — Z825 Family history of asthma and other chronic lower respiratory diseases: Secondary | ICD-10-CM | POA: Diagnosis not present

## 2022-03-22 DIAGNOSIS — Z808 Family history of malignant neoplasm of other organs or systems: Secondary | ICD-10-CM | POA: Diagnosis not present

## 2022-03-22 DIAGNOSIS — N390 Urinary tract infection, site not specified: Secondary | ICD-10-CM | POA: Diagnosis not present

## 2022-03-22 DIAGNOSIS — K760 Fatty (change of) liver, not elsewhere classified: Secondary | ICD-10-CM | POA: Diagnosis present

## 2022-03-22 DIAGNOSIS — N202 Calculus of kidney with calculus of ureter: Secondary | ICD-10-CM | POA: Diagnosis present

## 2022-03-22 DIAGNOSIS — N201 Calculus of ureter: Secondary | ICD-10-CM

## 2022-03-22 DIAGNOSIS — I700 Atherosclerosis of aorta: Secondary | ICD-10-CM | POA: Diagnosis present

## 2022-03-22 DIAGNOSIS — I129 Hypertensive chronic kidney disease with stage 1 through stage 4 chronic kidney disease, or unspecified chronic kidney disease: Secondary | ICD-10-CM | POA: Diagnosis present

## 2022-03-22 DIAGNOSIS — A419 Sepsis, unspecified organism: Secondary | ICD-10-CM | POA: Diagnosis present

## 2022-03-22 DIAGNOSIS — N136 Pyonephrosis: Secondary | ICD-10-CM | POA: Diagnosis present

## 2022-03-22 DIAGNOSIS — R55 Syncope and collapse: Secondary | ICD-10-CM | POA: Diagnosis present

## 2022-03-22 DIAGNOSIS — Z87442 Personal history of urinary calculi: Secondary | ICD-10-CM | POA: Diagnosis not present

## 2022-03-22 DIAGNOSIS — Z8249 Family history of ischemic heart disease and other diseases of the circulatory system: Secondary | ICD-10-CM | POA: Diagnosis not present

## 2022-03-22 DIAGNOSIS — Z79899 Other long term (current) drug therapy: Secondary | ICD-10-CM | POA: Diagnosis not present

## 2022-03-22 DIAGNOSIS — E860 Dehydration: Secondary | ICD-10-CM | POA: Diagnosis present

## 2022-03-22 DIAGNOSIS — J449 Chronic obstructive pulmonary disease, unspecified: Secondary | ICD-10-CM | POA: Diagnosis present

## 2022-03-22 DIAGNOSIS — R7989 Other specified abnormal findings of blood chemistry: Secondary | ICD-10-CM | POA: Diagnosis present

## 2022-03-22 DIAGNOSIS — E7800 Pure hypercholesterolemia, unspecified: Secondary | ICD-10-CM | POA: Diagnosis present

## 2022-03-22 DIAGNOSIS — G40409 Other generalized epilepsy and epileptic syndromes, not intractable, without status epilepticus: Secondary | ICD-10-CM | POA: Diagnosis present

## 2022-03-22 DIAGNOSIS — N182 Chronic kidney disease, stage 2 (mild): Secondary | ICD-10-CM | POA: Diagnosis present

## 2022-03-22 DIAGNOSIS — Z7984 Long term (current) use of oral hypoglycemic drugs: Secondary | ICD-10-CM | POA: Diagnosis not present

## 2022-03-22 LAB — GLUCOSE, CAPILLARY
Glucose-Capillary: 174 mg/dL — ABNORMAL HIGH (ref 70–99)
Glucose-Capillary: 244 mg/dL — ABNORMAL HIGH (ref 70–99)
Glucose-Capillary: 224 mg/dL — ABNORMAL HIGH (ref 70–99)
Glucose-Capillary: 219 mg/dL — ABNORMAL HIGH (ref 70–99)
Glucose-Capillary: 169 mg/dL — ABNORMAL HIGH (ref 70–99)

## 2022-03-22 LAB — MRSA NEXT GEN BY PCR, NASAL: MRSA by PCR Next Gen: NOT DETECTED

## 2022-03-22 LAB — HEMOGLOBIN A1C
Hgb A1c MFr Bld: 8.5 % — ABNORMAL HIGH (ref 4.8–5.6)
Mean Plasma Glucose: 197 mg/dL

## 2022-03-22 LAB — TROPONIN I (HIGH SENSITIVITY): Troponin I (High Sensitivity): 4 ng/L (ref <18)

## 2022-03-22 LAB — LACTIC ACID, PLASMA: Lactic Acid, Venous: 1.8 mmol/L (ref 0.5–1.9)

## 2022-03-22 NOTE — Progress Notes (Signed)
?PROGRESS NOTE ? ? ? ?Crystal Foley  GLO:[id number] DOB: August 23, 1951 DOA: 03/21/2022 ?PCP: [name], MD  ? ?Assessment & Plan: ?  ?Principal Problem: ?  Syncope ?Active Problems: ?  Benign hypertension ?  HLD (hyperlipidemia) ?  Seizure ([place]) ?  Diabetes mellitus, type 2 ([place]) ?  Pure hypercholesterolemia ?  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[place]) ?  Elevated lactic acid level ?  Sepsis secondary to UTI [facility]) ? ? ?Syncope: etiology unclear, possibly vasovagal vs orthostatic vs dehydration vs cardiogenic vs neurogenic. Continue on IVFs ?  ?Sepsis: see how pt met sepsis criteria via Dr. Alyse Low note. Continue on IV abxs. ? ?Possible UTI: urine cx is pending. Continue on IV cefepime ? ?Right ureteral calculus: w/ mild to mod right sided hydroureteronephrosis. Will f/u w/ urology outpatient on 03/26/22 as per urology. Continue on flomax  ? ?CKDII: Cr is labile. Continue on IVFs ?  ?Elevated lactic acid: resolved ? ?DM2: likely poorly controlled, HbA1c is pending. Continue on SSI w/ accuchecks  ?  ?HLD: continue on statin  ?  ?Seizure : continue on home dose of depakote. IV ativan prn  ? ?HTN: continue on losartan  ? ? ? ?DVT prophylaxis: lovenox ?Code Status: full  ?Family Communication: discussed pt's care w/ pt's family and answered their questions  ?Disposition Plan: likely d/c back home  ? ?Level of care: Telemetry Cardiac ?Consultants:  ? ? ?Procedures:  ? ?Antimicrobials: cefepime  ? ? ?Subjective: ?Pt c/o malaise  ? ?Objective: ?Vitals:  ? 03/22/22 0437 03/22/22 0753 03/22/22 0842 03/22/22 1208  ?BP: (!) 147/72 (!) 122/49  126/67  ?Pulse: 81 85  92  ?Resp: 17 18  17   ?Temp: 97.8 ?F (36.6 ?C)  (!) 97.5 ?F (36.4 ?C) 97.9 ?F (36.6 ?C)  ?TempSrc: Oral  Axillary   ?SpO2: 98% 97%  98%  ?Weight:      ?Height:      ? ? ?Intake/Output Summary (Last 24 hours) at 03/22/2022 1428 ?Last data filed at 03/22/2022 1050 ?Gross per 24 hour  ?Intake 0 ml  ?Output --  ?Net 0 ml  ? ?Filed Weights  ?  03/21/22 1330  ?Weight: 75 kg  ? ? ?Examination: ? ?General exam: Appears calm and comfortable  ?Respiratory system: Clear to auscultation. Respiratory effort normal. ?Cardiovascular system: S1 & S2 +. No rubs, gallops or clicks.  ?Gastrointestinal system: Abdomen is nondistended, soft and nontender. Normal bowel sounds heard. ?Central nervous system: Alert and oriented. Moves all extremities  ?Psychiatry: Judgement and insight appear normal. Mood & affect appropriate.  ? ? ? ?Data Reviewed: I have personally reviewed following labs and imaging studies ? ?CBC: ?Recent Labs  ?Lab 03/21/22 ?1333  ?WBC 8.1  ?NEUTROABS 5.0  ?HGB 14.4  ?HCT 43.9  ?MCV 90.7  ?PLT 169  ? ?Basic Metabolic Panel: ?Recent Labs  ?Lab 03/21/22 ?1333  ?NA 137  ?K 3.7  ?CL 103  ?CO2 22  ?GLUCOSE 210*  ?BUN 13  ?CREATININE 0.77  ?CALCIUM 8.1*  ?MG 1.9  ? ?GFR: ?Estimated Creatinine Clearance: 64.9 mL/min (by C-G formula based on SCr of 0.77 mg/dL). ?Liver Function Tests: ?Recent Labs  ?Lab 03/21/22 ?1333  ?AST 17  ?ALT 9  ?ALKPHOS 40  ?BILITOT 0.7  ?PROT 6.3*  ?ALBUMIN 2.9*  ? ?No results for input(s): LIPASE, AMYLASE in the last 168 hours. ?No results for input(s): AMMONIA in the last 168 hours. ?Coagulation Profile: ?No results for input(s): INR, PROTIME in the last  168 hours. ?Cardiac Enzymes: ?No results for input(s): CKTOTAL, CKMB, CKMBINDEX, TROPONINI in the last 168 hours. ?BNP (last 3 results) ?No results for input(s): PROBNP in the last 8760 hours. ?HbA1C: ?No results for input(s): HGBA1C in the last 72 hours. ?CBG: ?Recent Labs  ?Lab 03/21/22 ?2225 03/22/22 ?5732 03/22/22 ?1046 03/22/22 ?1240  ?GLUCAP 181* 174* 244* 224*  ? ?Lipid Profile: ?No results for input(s): CHOL, HDL, LDLCALC, TRIG, CHOLHDL, LDLDIRECT in the last 72 hours. ?Thyroid Function Tests: ?Recent Labs  ?  03/21/22 ?2052  ?TSH 2.380  ? ?Anemia Panel: ?No results for input(s): VITAMINB12, FOLATE, FERRITIN, TIBC, IRON, RETICCTPCT in the last 72 hours. ?Sepsis Labs: ?Recent  Labs  ?Lab 03/21/22 ?2052 03/21/22 ?2316  ?LATICACIDVEN 2.4* 1.8  ? ? ?Recent Results (from the past 240 hour(s))  ?Microscopic Examination     Status: Abnormal  ? Collection Time: 03/20/22  8:34 AM  ? Urine  ?Result Value Ref Range Status  ? WBC, UA 6-10 (A) 0 - 5 /hpf Final  ? RBC 3-10 (A) 0 - 2 /hpf Final  ? Epithelial Cells (non renal) >10 (A) 0 - 10 /hpf Final  ? Renal Epithel, UA 0-10 None seen /hpf Final  ? Bacteria, UA Moderate (A) None seen/Few Final  ?CULTURE, URINE COMPREHENSIVE     Status: None (Preliminary result)  ? Collection Time: 03/20/22  9:32 AM  ? Specimen: Urine  ? UR  ?Result Value Ref Range Status  ? Urine Culture, Comprehensive Preliminary report  Preliminary  ? Organism ID, Bacteria Comment  Preliminary  ?  Comment: Microbiological testing to rule out the presence of possible pathogens ?is in progress. ?  ?MRSA Next Gen by PCR, Nasal     Status: None  ? Collection Time: 03/21/22 10:40 AM  ? Specimen: Nasal Mucosa; Nasal Swab  ?Result Value Ref Range Status  ? MRSA by PCR Next Gen NOT DETECTED NOT DETECTED Final  ?  Comment: (NOTE) ?The GeneXpert MRSA Assay (FDA approved for NASAL specimens only), ?is one component of a comprehensive MRSA colonization surveillance ?program. It is not intended to diagnose MRSA infection nor to guide ?or monitor treatment for MRSA infections. ?Test performance is not FDA approved in patients less than 2 years ?old. ?Performed at [facility], [place], ?[place] [address] ?  ?CULTURE, BLOOD (ROUTINE X 2) w Reflex to ID Panel     Status: None (Preliminary result)  ? Collection Time: 03/21/22  8:52 PM  ? Specimen: BLOOD  ?Result Value Ref Range Status  ? Specimen Description BLOOD LAC  Final  ? Special Requests BOTTLES DRAWN AEROBIC AND ANAEROBIC BCAV  Final  ? Culture   Final  ?  NO GROWTH < 12 HOURS ?Performed at [facility], [address] [place], [place] [address] ?  ? Report Status PENDING  Incomplete  ?CULTURE, BLOOD  (ROUTINE X 2) w Reflex to ID Panel     Status: None (Preliminary result)  ? Collection Time: 03/21/22  9:22 PM  ? Specimen: BLOOD  ?Result Value Ref Range Status  ? Specimen Description BLOOD RIGHT HAND  Final  ? Special Requests   Final  ?  BOTTLES DRAWN AEROBIC AND ANAEROBIC Blood Culture adequate volume  ? Culture   Final  ?  NO GROWTH < 12 HOURS ?Performed at [facility], [address], [place], [place] [address] ?  ? Report Status PENDING  Incomplete  ?  ? ? ? ? ? ?Radiology Studies: ?X-ray chest  PA and lateral ? ?Result Date: 03/21/2022 ?CLINICAL DATA:  Syncopal episode. EXAM: CHEST - 2 VIEW COMPARISON:  PA Lat 05/30/2021. FINDINGS: The heart size and mediastinal contours are stable with aortic atherosclerosis and tortuosity, normal cardiac size. The lungs are clear of focal infiltrates with old left perihilar linear scarring again shown. No pleural effusion is seen. There is osteopenia, mild thoracic kyphosis, mild thoracic spondylosis. Mild chronic anterior wedge compression fracture deformity T6 vertebral body. IMPRESSION: No active cardiopulmonary disease. Stable chest with chronic change. Aortic atherosclerosis. Electronically Signed   By: Telford Nab M.D.   On: 03/21/2022 20:52  ? ?CT ABDOMEN PELVIS W CONTRAST ? ?Result Date: 03/21/2022 ?CLINICAL DATA:  A 71 year old female presents for evaluation of acute abdominal pain with flank pain, suspected kidney stones. EXAM: CT ABDOMEN AND PELVIS WITH CONTRAST TECHNIQUE: Multidetector CT imaging of the abdomen and pelvis was performed using the standard protocol following bolus administration of intravenous contrast. RADIATION DOSE REDUCTION: This exam was performed according to the departmental dose-optimization program which includes automated exposure control, adjustment of the mA and/or kV according to patient size and/or use of iterative reconstruction technique. CONTRAST:  163m OMNIPAQUE IOHEXOL 300 MG/ML  SOLN COMPARISON:  May 02, 2020.  FINDINGS: Lower chest: Basilar atelectasis and scarring. No effusion. No consolidative changes. Hepatobiliary: Hepatic steatosis. No focal, suspicious hepatic lesion, pericholecystic stranding or biliary duct dist

## 2022-03-22 NOTE — Progress Notes (Signed)
Patient complained of pain 6/10. Ketorolac administered. ?

## 2022-03-22 NOTE — Progress Notes (Signed)
Pharmacy Antibiotic Note ? ?Crystal Foley is a 71 y.o. female admitted on 03/21/2022 with sepsis / increased lactic acid with syncope.  Pharmacy has been consulted for Cefepime and Vancomycin dosing. ? ?Plan: ?Continue cefepime 2 gm q8h  ? ?Pt received vancomycin 1750 mg x 1 dose followed by 1250 mg q24H. Predicted AUC 451. Goal AUC 400-550. Plan to obtain vancomycin level after 4th or 5th dose.  ? ? ?Height: '5\' 4"'$  (162.6 cm) ?Weight: 75 kg (165 lb 5.5 oz) ?IBW/kg (Calculated) : 54.7 ? ?Temp (24hrs), Avg:98.1 ?F (36.7 ?C), Min:97.5 ?F (36.4 ?C), Max:98.7 ?F (37.1 ?C) ? ?Recent Labs  ?Lab 03/21/22 ?1333 03/21/22 ?2052 03/21/22 ?2316  ?WBC 8.1  --   --   ?CREATININE 0.77  --   --   ?LATICACIDVEN  --  2.4* 1.8  ? ?  ?Estimated Creatinine Clearance: 64.9 mL/min (by C-G formula based on SCr of 0.77 mg/dL).   ? ?Allergies  ?Allergen Reactions  ? Dilantin [Phenytoin Sodium Extended] Swelling  ? Metformin Diarrhea  ? Phenytoin Swelling  ? ? ?Antimicrobials this admission: ?4/06 Ceftriaxone >> x 1 dose ?4/06 Vancomycin >>  ?4/07 Cefepime >> ? ?Microbiology results: ?4/06 BCx: Pending ?4/06 UCx: Pending  ? ?Thank you for allowing pharmacy to be a part of this patient?s care. ? ?Eleonore Chiquito, PharmD,  ?03/22/2022 ?8:47 AM ? ? ?

## 2022-03-22 NOTE — Progress Notes (Signed)
Patient complained of right sided flank pain 8/10. Morphine administered. ?

## 2022-03-22 NOTE — Evaluation (Signed)
Physical Therapy Evaluation ?Patient Details ?Name: Crystal Foley ?MRN: [id number] ?DOB: [birth year] ?Today's Date: 03/22/2022 ? ?History of Present Illness ? Crystal Foley is a70yoF who [facility] after concern for 2 minutes syncope on commode at home, witnessed by husband. PMH: HTN, HLD, NIIDM. UA (+). Pt denies any prior syncope or near syncope episodes. Pt endorses bearing down with elevated IAP on commode during incident.  ?Clinical Impression ? Pt admitted with above diagnosis. Pt currently with functional limitations due to the deficits listed below (see "PT Problem List"). Upon entry, pt in bed, awake and agreeable to participate. The pt is alert, pleasant, interactive, and able to provide info regarding prior level of function, both in tolerance and independence. No frank impairment in balance or safety issues this date, nor any dizziness or other presyncopal prodrome. OH assessment appears unremarkable. Pt denies any acute impairment with mobility in room earlier with RN. Pt does endorse bearing down for stubborn bowel during episode, urine color also indicating a secondary dehydration component which could have made vagal reflex more significant. As pt has no acute impairment at this time, PT signing off at this time. Educated pt on ways to avoid deconditioning while admitted.  ? ? ?Orthostatic VS for the past 24 hrs (Last 3 readings): ? BP- Lying Pulse- Lying BP- Sitting Pulse- Sitting BP- Standing at 0 minutes Pulse- Standing at 0 minutes BP- Standing at 3 minutes Pulse- Standing at 3 minutes  ?03/22/22 1453 138/76 93 143/75 97 140/72 108 132/68 108  ?03/21/22 2155 184/75 87 158/83 92 152/85 107 -- --  ?03/21/22 1551 (!) 132/105 88 146/80 98 163/72 105 -- --  ? ?   ?   ? ?Recommendations for follow up therapy are one component of a multi-disciplinary discharge planning process, led by the attending physician.  Recommendations may be updated based on patient status, additional functional criteria  and insurance authorization. ? ?Follow Up Recommendations No PT follow up ? ?  ?Assistance Recommended at Discharge None  ?Patient can return home with the following ?   ? ?  ?Equipment Recommendations None recommended by PT  ?Recommendations for Other Services ?    ?  ?Functional Status Assessment Patient has not had a recent decline in their functional status  ? ?  ?Precautions / Restrictions Precautions ?Precautions: Fall ?Restrictions ?Weight Bearing Restrictions: No  ? ?  ? ?Mobility ? Bed Mobility ?Overal bed mobility: Modified Independent ?  ?  ?  ?  ?  ?  ?  ?  ? ?Transfers ?Overall transfer level: Modified independent ?  ?  ?  ?  ?  ?  ?  ?  ?  ?  ? ?Ambulation/Gait ?Ambulation/Gait assistance:  (deferred at eval; AMB with RN earlier in day without issue per RN and per Pt) ?  ?  ?  ?  ?  ?  ?  ? ?Stairs ?  ?  ?  ?  ?  ? ?Wheelchair Mobility ?  ? ?Modified [place] (Stroke Patients Only) ?  ? ?  ? ?Balance   ?  ?  ?  ?  ?  ?  ?  ?  ?  ?  ?  ?  ?  ?  ?  ?  ?  ?  ?   ? ? ? ?Pertinent Vitals/Pain Pain Assessment ?Pain Assessment: No/denies pain  ? ? ?Home Living Family/patient expects to be discharged to:: Private residence ?Living Arrangements: Alone ?Available Help at Discharge: Family ?  Type of Home:  [place] [facility]) ?Home Access: Level entry ?  ?  ?  ?Home Layout: One level ?Home Equipment: None ?Additional Comments: no DME, no falls history  ?  ?Prior Function Prior Level of Function : Independent/Modified Independent ?  ?  ?  ?  ?  ?  ?  ?  ?  ? ? ?Hand Dominance  ?   ? ?  ?Extremity/Trunk Assessment  ? Upper Extremity Assessment ?Upper Extremity Assessment: Overall WFL for tasks assessed ?  ? ?Lower Extremity Assessment ?Lower Extremity Assessment: Overall WFL for tasks assessed ?  ? ?Cervical / Trunk Assessment ?Cervical / Trunk Assessment: Normal  ?Communication  ?    ?Cognition Arousal/Alertness: Awake/alert ?Behavior During Therapy: [facility] for tasks assessed/performed ?Overall Cognitive Status:  Within Functional Limits for tasks assessed ?  ?  ?  ?  ?  ?  ?  ?  ?  ?  ?  ?  ?  ?  ?  ?  ?  ?  ?  ? ?  ?General Comments   ? ?  ?Exercises    ? ?Assessment/Plan  ?  ?PT Assessment Patient does not need any further PT services  ?PT Problem List   ? ?   ?  ?PT Treatment Interventions     ? ?PT Goals (Current goals can be found in the Care Plan section)  ?Acute Rehab PT Goals ?PT Goal Formulation: All assessment and education complete, DC therapy ? ?  ?Frequency   ?  ? ? ?Co-evaluation   ?  ?  ?  ?  ? ? ?  ?AM-PAC PT "6 Clicks" Mobility  ?Outcome Measure Help needed turning from your back to your side while in a flat bed without using bedrails?: None ?Help needed moving from lying on your back to sitting on the side of a flat bed without using bedrails?: None ?Help needed moving to and from a bed to a chair (including a wheelchair)?: None ?Help needed standing up from a chair using your arms (e.g., wheelchair or bedside chair)?: None ?Help needed to walk in hospital room?: A Little ?Help needed climbing 3-5 steps with a railing? : A Little ?6 Click Score: 22 ? ?  ?End of Session   ?Activity Tolerance: Patient tolerated treatment well;No increased pain ?Patient left: in bed;with call bell/phone within reach;with family/visitor present ?Nurse Communication: Mobility status ?PT Visit Diagnosis: Other symptoms and signs involving the nervous system (R29.898) ?  ? ?Time: 2694-8546 ?PT Time Calculation (min) (ACUTE ONLY): 22 min ? ? ?Charges:   PT Evaluation ?$PT Eval Moderate Complexity: 1 Mod ?  ?  ?   ? ?3:24 PM, 03/22/22 ?Etta Grandchild, PT, DPT ?Physical Therapist - [place] ?[facility]  ?[phone] (ASCOM)  ? ? ?Crystal Foley ?03/22/2022, 3:20 PM ? ?

## 2022-03-23 DIAGNOSIS — N3000 Acute cystitis without hematuria: Secondary | ICD-10-CM

## 2022-03-23 LAB — BASIC METABOLIC PANEL
Anion gap: 6 (ref 5–15)
BUN: 13 mg/dL (ref 8–23)
CO2: 25 mmol/L (ref 22–32)
Calcium: 8.2 mg/dL — ABNORMAL LOW (ref 8.9–10.3)
Chloride: 103 mmol/L (ref 98–111)
Creatinine, Ser: 0.55 mg/dL (ref 0.44–1.00)
GFR, Estimated: >60 mL/min (ref >60)
Glucose, Bld: 117 mg/dL — ABNORMAL HIGH (ref 70–99)
Potassium: 3.7 mmol/L (ref 3.5–5.1)
Sodium: 134 mmol/L — ABNORMAL LOW (ref 135–145)

## 2022-03-23 LAB — CULTURE, URINE COMPREHENSIVE

## 2022-03-23 LAB — CBC
HCT: 36.3 % (ref 36.0–46.0)
Hemoglobin: 12.1 g/dL (ref 12.0–15.0)
MCH: 30 pg (ref 26.0–34.0)
MCHC: 33.3 g/dL (ref 30.0–36.0)
MCV: 90.1 fL (ref 80.0–100.0)
Platelets: 142 10*3/uL — ABNORMAL LOW (ref 150–400)
RBC: 4.03 MIL/uL (ref 3.87–5.11)
RDW: 13 % (ref 11.5–15.5)
WBC: 5.4 10*3/uL (ref 4.0–10.5)
nRBC: 0 % (ref 0.0–0.2)

## 2022-03-23 LAB — GLUCOSE, CAPILLARY
Glucose-Capillary: 114 mg/dL — ABNORMAL HIGH (ref 70–99)
Glucose-Capillary: 171 mg/dL — ABNORMAL HIGH (ref 70–99)

## 2022-03-23 LAB — URINE CULTURE: Culture: >=100000 — AB

## 2022-03-23 MED ORDER — CEFDINIR 300 MG PO CAPS
300.0000 mg | ORAL_CAPSULE | Freq: Two times a day (BID) | ORAL | 0 refills | Status: AC
Start: 2022-03-23 — End: 2022-03-28

## 2022-03-23 MED ORDER — OXYCODONE-ACETAMINOPHEN 5-325 MG PO TABS: 1.0000 | ORAL_TABLET | Freq: Three times a day (TID) | ORAL | 0 refills | Status: AC | PRN

## 2022-03-23 NOTE — Discharge Summary (Signed)
Physician Discharge Summary  ?Crystal Foley QAS:[id number] DOB: Apr 17, 1951 DOA: 03/21/2022 ? ?PCP: [name], MD ? ?Admit date: 03/21/2022 ?Discharge date: 03/23/2022 ? ?Admitted From: home  ?Disposition:  home  ? ?Recommendations for Outpatient Follow-up:  ?Follow up with PCP in 1-2 weeks ?F/u w/ urology, Dr. Diamantina Providence, on 03/26/22 ? ?Home Health: no  ?Equipment/Devices: ? ?Discharge Condition: stable  ?CODE STATUS: full  ?Diet recommendation: Heart Healthy / Carb Modified  ? ?Brief/Interim Summary: ?HPI was taken from Dr. Tobie Poet: ?[name] is a 71 year old female with history of hypertension, hyperlipidemia, non-insulin-dependent diabetes mellitus on oral antihyperglycemic agents, who presents emergency department for chief concerns of syncope. ? ?Initial vitals in the emergency department showed temperature of 98.7, respiration rate of 13, heart rate 77, blood pressure 150/70, SPO2 of 98% on room air. ?  ?Serum sodium 137, potassium 3.7, chloride 103, bicarb 22, BUN of 13, serum creatinine of 0.77, GFR greater than 60, nonfasting blood glucose 210, WBC 8.1, hemoglobin 14.4, platelets of 169. ?  ?UA: Was positive for large leukocytes. ? ?Valproic acid level was 65. ?  ?CT abdomen pelvis with contrast: Was read as mild to moderate right sided hydroureteronephrosis, due to proximal right urethral calculus just beyond the right UPJ, dilatation involving renal pelvis and infundibular elements more so than calyces, mildly confounded by presence of large right renal cysts.  Calculus appears to have been present since May 2021, is associated with mild diffuse urothelial enhancement which may be due to chronic irritation.  Bilateral nephrolithiasis otherwise similar to prior study.  Hepatic steatosis.  Abundant stool in the rectosigmoid colon.  Aortic atherosclerosis. ?  ?Per EDP who discussed with Dr. Diamantina Providence who states that the urethral calculus is not contributing to her syncope and does not recommend further  inpatient follow-up from a urology standpoint. ?  ?ED treatment: Toradol 15 mg IV, ceftriaxone 1 g, LR 1 L bolus. ?  ?At bedside, she is able to tell me her name, age, current year, and her husband.  She does not appear to be in acute distress. ?  ?She reports that while she was having a bowel movement at approximately noon, the initial bowel movement was normal and well formed without difficulty.  The second bowel movement she had some straining.  She endorsed feeling dizzy and thirsty and asked her husband to bring her some water.  Per her husband, patient's eyes rolled back and she lost consciousness and passed out several times while on the toilet.  This prompted family to call EMS to bring her to the emergency department for further evaluation.  She reports this is never happened before. ?  ?She denies chest pain, shortness of breath, abdominal pain, diarrhea, nausea, vomiting, fever, new cough, chills. ?  ?She endorses dysuria and burning with urination for the last couple of days.  ? ?As per Dr. Jimmye Norman 4/7-03/23/22: Pt presented after a syncopal episode. The etiology of the syncopal episode was unknown. Orthostatic vital signs were neg. Of note, pt was found to have a right ureteral calculus w/ mild to moderate right sided hydroureteronephrosis. Urine cx was pending at the time of d/c. Pt did receive IV cefepime inpatient and was d/c home on po cefdinir to complete the course. Pt will f/u outpatient w/ uro, Dr. Diamantina Providence, on 03/26/22. Pt verbalized her understanding. ? ?Discharge Diagnoses:  ?Principal Problem: ?  Syncope ?Active Problems: ?  Benign hypertension ?  HLD (hyperlipidemia) ?  Seizure ([place]) ?  Diabetes mellitus, type 2 ([place]) ?  Pure hypercholesterolemia ?  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[place]) ?  Elevated lactic acid level ?  Sepsis secondary to UTI [facility]) ?  UTI (urinary tract infection) ? ?Syncope: etiology unclear, possibly vasovagal vs dehydration vs cardiogenic vs  neurogenic.  ?  ?Sepsis: see how pt met sepsis criteria via Dr. Alyse Low note. Continue on abxs Resolved  ? ?Possible UTI: urine cx is pending. Continue on IV cefepime ? ?Right ureteral calculus: w/ mild to mod right sided hydroureteronephrosis. Will f/u w/ urology outpatient on 03/26/22 as per urology. Continue on flomax  ? ?CKDII: Cr is trending down from day prior. ?  ?Elevated lactic acid: resolved ? ?DM2: poorly controlled, HbA1c 8.5. Continue on SSI w/ accuchecks  ?  ?HLD: continue on statin  ?  ?Seizure : continue on home dose of depakote. IV ativan prn  ? ?HTN: continue on losartan  ? ?Discharge Instructions ? ?Discharge Instructions   ? ? Diet - low sodium heart healthy   Complete by: As directed ?  ? Diet Carb Modified   Complete by: As directed ?  ? Discharge instructions   Complete by: As directed ?  ? F/u w/ PCP in 1-2 weeks. F/u w/ urology, Dr. Diamantina Providence on 03/26/22  ? Increase activity slowly   Complete by: As directed ?  ? ?  ? ?Allergies as of 03/23/2022   ? ?   Reactions  ? Dilantin [phenytoin Sodium Extended] Swelling  ? Metformin Diarrhea  ? Phenytoin Swelling  ? ?  ? ?  ?Medication List  ?  ? ?STOP taking these medications   ? ?canagliflozin 300 MG Tabs tablet ?Commonly known as: INVOKANA ?  ? ?  ? ?TAKE these medications   ? ?albuterol 108 (90 Base) MCG/ACT inhaler ?Commonly known as: VENTOLIN HFA ?Inhale 1 puff into the lungs every 6 (six) hours as needed for wheezing or shortness of breath. ?  ?atorvastatin 40 MG tablet ?Commonly known as: LIPITOR ?Take 40 mg by mouth daily. ?  ?cefdinir 300 MG capsule ?Commonly known as: OMNICEF ?Take 1 capsule (300 mg total) by mouth 2 (two) times daily for 5 days. ?  ?clotrimazole-betamethasone cream ?Commonly known as: LOTRISONE ?Apply 1 application topically 2 (two) times daily. ?  ?diclofenac sodium 1 % Gel ?Commonly known as: VOLTAREN ?Apply 4 g topically 4 (four) times daily. ?  ?divalproex 250 MG 24 hr tablet ?Commonly known as: DEPAKOTE ER ?Take 500-750 mg  by mouth 2 (two) times daily. Take 550m in morning and take 7576mat night ?  ?estradiol 0.1 MG/GM vaginal cream ?Commonly known as: ESTRACE VAGINAL ?Apply 0.92m100mpea-sized amount)  just inside the vaginal introitus with a finger-tip on Monday, Wednesday and Friday nights. ?  ?fluticasone 50 MCG/ACT nasal spray ?Commonly known as: FLONASE ?Place 2 sprays into both nostrils daily as needed for allergies or rhinitis. ?  ?glimepiride 2 MG tablet ?Commonly known as: AMARYL ?TAKE 1 TABLET DAILY WITH  BREAKFAST ?  ?glipiZIDE 5 MG tablet ?Commonly known as: GLUCOTROL ?Take 5 mg by mouth daily. ?  ?losartan 100 MG tablet ?Commonly known as: COZAAR ?Take 1 tablet by mouth daily. ?  ?oxyCODONE-acetaminophen 5-325 MG tablet ?Commonly known as: Percocet ?Take 1 tablet by mouth every 8 (eight) hours as needed for up to 3 days for severe pain or moderate pain. ?  ?Rybelsus 7 MG Tabs ?Generic drug: Semaglutide ?Take 1 tablet by mouth daily. ?  ?tamsulosin 0.4 MG Caps capsule ?Commonly known as: FLOMAX ?  TAKE 1 CAPSULE BY MOUTH EVERY DAY ?  ?Trospium Chloride 60 MG Cp24 ?Take 1 capsule (60 mg total) by mouth daily. ?  ? ?  ? ? ?Allergies  ?Allergen Reactions  ? Dilantin [Phenytoin Sodium Extended] Swelling  ? Metformin Diarrhea  ? Phenytoin Swelling  ? ? ?Consultations: ?uro ? ? ?Procedures/Studies: ?X-ray chest PA and lateral ? ?Result Date: 03/21/2022 ?CLINICAL DATA:  Syncopal episode. EXAM: CHEST - 2 VIEW COMPARISON:  PA Lat 05/30/2021. FINDINGS: The heart size and mediastinal contours are stable with aortic atherosclerosis and tortuosity, normal cardiac size. The lungs are clear of focal infiltrates with old left perihilar linear scarring again shown. No pleural effusion is seen. There is osteopenia, mild thoracic kyphosis, mild thoracic spondylosis. Mild chronic anterior wedge compression fracture deformity T6 vertebral body. IMPRESSION: No active cardiopulmonary disease. Stable chest with chronic change. Aortic atherosclerosis.  Electronically Signed   By: Telford Nab M.D.   On: 03/21/2022 20:52  ? ?Abdomen 1 view (KUB) ? ?Result Date: 03/20/2022 ?CLINICAL DATA:  Nephrolithiasis EXAM: ABDOMEN - 1 VIEW COMPARISON:  03/22/2021, CT 0

## 2022-03-25 ENCOUNTER — Other Ambulatory Visit: Payer: Self-pay

## 2022-03-25 DIAGNOSIS — N200 Calculus of kidney: Secondary | ICD-10-CM

## 2022-03-26 ENCOUNTER — Ambulatory Visit
Admission: RE | Admit: 2022-03-26 | Discharge: 2022-03-26 | Disposition: A | Discharge status: Home / Self Care | Payer: Medicare Other | Attending: Urology | Admitting: Urology

## 2022-03-26 ENCOUNTER — Ambulatory Visit
Admission: RE | Admit: 2022-03-26 | Discharge: 2022-03-26 | Disposition: A | Discharge status: Home / Self Care | Payer: Medicare Other | Source: Ambulatory Visit | Attending: Urology | Admitting: Urology

## 2022-03-26 ENCOUNTER — Other Ambulatory Visit
Admission: RE | Admit: 2022-03-26 | Discharge: 2022-03-26 | Disposition: A | Discharge status: Home / Self Care | Payer: Medicare Other | Source: Home / Self Care | Attending: Urology | Admitting: Urology

## 2022-03-26 ENCOUNTER — Ambulatory Visit (INDEPENDENT_AMBULATORY_CARE_PROVIDER_SITE_OTHER): Payer: Medicare Other | Admitting: Urology

## 2022-03-26 ENCOUNTER — Telehealth: Payer: Self-pay

## 2022-03-26 ENCOUNTER — Encounter: Payer: Self-pay | Admitting: Urology

## 2022-03-26 VITALS — BP 151/94 | HR 112 | Ht 64.0 in | Wt 165.0 lb

## 2022-03-26 DIAGNOSIS — N200 Calculus of kidney: Secondary | ICD-10-CM

## 2022-03-26 LAB — CULTURE, BLOOD (ROUTINE X 2)
Culture: NO GROWTH
Culture: NO GROWTH
Special Requests: ADEQUATE

## 2022-03-26 LAB — URINALYSIS, COMPLETE (UACMP) WITH MICROSCOPIC
Bilirubin Urine: NEGATIVE
Glucose, UA: >1000 mg/dL — AB
Ketones, ur: 15 mg/dL — AB
Leukocytes,Ua: NEGATIVE
Nitrite: NEGATIVE
Protein, ur: NEGATIVE mg/dL
Specific Gravity, Urine: 1.015 (ref 1.005–1.030)
pH: 6 (ref 5.0–8.0)

## 2022-03-26 NOTE — Progress Notes (Signed)
? ?  03/26/2022 ?12:34 PM  ? ?Crystal Foley ?August 03, 1951 ?[id number] ? ?Reason for visit: Right proximal ureteral stone ? ?HPI: ?71 year old female who has been followed by Crystal Council, PA for nephrolithiasis and OAB.  She was seen recently in urology clinic on 03/20/2022 and UA showed 6-10 WBCs, 3-10 RBCs, greater than 10 epithelial cells, and moderate bacteria, and KUB suggested a possible 4 mm right renal stone.  Crystal Foley ordered a microscopic hematuria work-up with a CT urogram and cystoscopy which had not yet been completed. ? ?She then presented to the ER the next day on 03/21/2022 after a syncopal episode while in the restroom, as well as some right-sided flank pain.  CT at that time showed a 4 mm right proximal ureteral stone, urinalysis was contaminated with skin cells, and she was started on antibiotics.  I reviewed the inpatient notes extensively.  She reports that since discharge from the hospital she has not had any right-sided flank pain or urinary symptoms. ? ?I personally viewed and interpreted the CT from recent hospitalization as well as the KUB today.  KUB today shows no evidence of right 4 mm stone that was previously seen on both CT and KUB, and urinalysis today is completely benign with no microscopic hematuria or pyuria. ? ?Reassurance was provided that with her normal UA, benign KUB, and resolution of symptoms, she most likely passed the stone spontaneously.  We discussed options including a repeat CT, but she defers for now which I think is very reasonable.  Return precautions were discussed at length. ? ?-RTC with Crystal Foley 6 months KUB ?-CT urogram and cystoscopy canceled that were previously ordered for microscopic hematuria work-up with likely etiology being a ureteral stone, with resolution of microscopic hematuria after suspected passage ? ?I spent 30 total minutes on the day of the encounter including pre-visit review of the medical record, face-to-face time with the patient, and post visit  ordering of labs/imaging/tests. ? ? ?Crystal Co, MD ? ?[facility] ?[address] ?[place], [place] [address] ?[phone] ? ?

## 2022-03-26 NOTE — Patient Instructions (Signed)
Lithotripsy ?Lithotripsy is a treatment that can help break up kidney stones that are too large to pass on their own. This is a nonsurgical procedure that crushes a kidney stone with shock waves. These shock waves pass through your body and focus on the kidney stone. They cause the kidney stone to break up into smaller pieces while it is still in the urinary tract. The smaller pieces of stone can pass more easily out of your body in the urine. ?Tell a health care provider about: ?Any allergies you have. ?All medicines you are taking, including vitamins, herbs, eye drops, creams, and over-the-counter medicines. ?Any problems you or family members have had with anesthetic medicines. ?Any blood disorders you have. ?Any surgeries you have had. ?Any medical conditions you have. ?Whether you are pregnant or may be pregnant. ?What are the risks? ?Generally, this is a safe procedure. However, problems may occur, including: ?Infection. ?Bleeding from the kidney. ?Bruising of the kidney or skin. ?Scarring of the kidney, which can lead to: ?Increased blood pressure. ?Poor kidney function. ?Return (recurrence) of kidney stones. ?Damage to other structures or organs, such as the liver, colon, spleen, or pancreas. ?Blockage (obstruction) of the tube that carries urine from the kidney to the bladder (ureter). ?Failure of the kidney stone to break into pieces (fragments). ?What happens before the procedure? ?Staying hydrated ?Follow instructions from your health care provider about hydration, which may include: ?Up to 2 hours before the procedure - you may continue to drink clear liquids, such as water, clear fruit juice, black coffee, and plain tea. ?Eating and drinking restrictions ?Follow instructions from your health care provider about eating and drinking, which may include: ?8 hours before the procedure - stop eating heavy meals or foods, such as meat, fried foods, or fatty foods. ?6 hours before the procedure - stop eating  light meals or foods, such as toast or cereal. ?6 hours before the procedure - stop drinking milk or drinks that contain milk. ?2 hours before the procedure - stop drinking clear liquids. ?Medicines ?Ask your health care provider about: ?Changing or stopping your regular medicines. This is especially important if you are taking diabetes medicines or blood thinners. ?Taking medicines such as aspirin and ibuprofen. These medicines can thin your blood. Do not take these medicines unless your health care provider tells you to take them. ?Taking over-the-counter medicines, vitamins, herbs, and supplements. ?Tests ?You may have tests, such as: ?Blood tests. ?Urine tests. ?Imaging tests, such as a CT scan. ?General instructions ?Plan to have someone take you home from the hospital or clinic. ?If you will be going home right after the procedure, plan to have someone with you for 24 hours. ?Ask your health care provider what steps will be taken to help prevent infection. These may include washing skin with a germ-killing soap. ?What happens during the procedure? ? ?An IV will be inserted into one of your veins. ?You will be given one or more of the following: ?A medicine to help you relax (sedative). ?A medicine to make you fall asleep (general anesthetic). ?A water-filled cushion may be placed behind your kidney or on your abdomen. In some cases, you may be placed in a tub of lukewarm water. ?Your body will be positioned in a way that makes it easy to target the kidney stone. ?An X-ray or ultrasound exam will be done to locate your stone. ?Shock waves will be aimed at the stone. If you are awake, you may feel a tapping sensation as  the shock waves pass through your body. ?A flexible tube with holes in it (stent) may be placed in the ureter. This will help keep urine flowing from the kidney if the fragments of the stone have been blocking the ureter. ?The procedure may vary among health care providers and hospitals. ?What  happens after the procedure? ?You may have an X-ray to see whether the procedure was able to break up the kidney stone and how much of the stone has passed. If large stone fragments remain after treatment, you may need to have a second procedure at a later time. ?Your blood pressure, heart rate, breathing rate, and blood oxygen level will be monitored until you leave the hospital or clinic. ?You may be given antibiotics or pain medicine as needed. ?If a stent was placed in your ureter during surgery, it may stay in place for a few weeks. ?You may need to strain your urine to collect pieces of the kidney stone for testing. ?You will need to drink plenty of water. ?If you were given a sedative during the procedure, it can affect you for several hours. Do not drive or operate machinery until your health care provider says that it is safe. ?Summary ?Lithotripsy is a treatment that can help break up kidney stones that are too large to pass on their own. ?Lithotripsy is a nonsurgical procedure that crushes a kidney stone with shock waves. ?Generally, this is a safe procedure. However, problems may occur, including damage to the kidney or other organs, infection, or obstruction of the tube that carries urine from the kidney to the bladder (ureter). ?You may have a stent placed in your ureter to help drain your urine. This stent may stay in place for a few weeks. ?After the procedure, you will need to drink plenty of water. You may be asked to strain your urine to collect pieces of the kidney stone for testing. ?This information is not intended to replace advice given to you by your health care provider. Make sure you discuss any questions you have with your health care provider. ?Document Revised: 09/14/2019 Document Reviewed: 09/15/2019 ?Elsevier Patient Education ? [place]. ? ?

## 2022-03-26 NOTE — Telephone Encounter (Signed)
Called pt informed her of the information below. CT & Cysto cancelled. KUB ordered for 22mo. Appt scheduled. Pt voiced understanding.  ?

## 2022-03-26 NOTE — Telephone Encounter (Signed)
-----   Message from Billey Co, MD sent at 03/26/2022 12:24 PM EDT ----- ?Regarding: Cancel follow-up and imaging ?Please cancel her cystoscopy 4/26 with Dr. Erlene Quan, and canceled the CT urogram that was ordered by [facility].  She passed a small stone and so does not need further imaging or work-up for microscopic blood ? ?She can follow-up with [name] in 6 months with KUB prior ? ? ? ?Nickolas Madrid, MD ?03/26/2022 ? ? ? ?

## 2022-04-10 ENCOUNTER — Other Ambulatory Visit: Payer: Medicare Other | Admitting: Urology

## 2022-04-11 ENCOUNTER — Encounter: Payer: Self-pay | Admitting: Podiatry

## 2022-04-11 ENCOUNTER — Ambulatory Visit (INDEPENDENT_AMBULATORY_CARE_PROVIDER_SITE_OTHER): Payer: Medicare Other | Admitting: Podiatry

## 2022-04-11 DIAGNOSIS — B351 Tinea unguium: Secondary | ICD-10-CM

## 2022-04-11 DIAGNOSIS — E119 Type 2 diabetes mellitus without complications: Secondary | ICD-10-CM | POA: Diagnosis not present

## 2022-04-11 DIAGNOSIS — M79609 Pain in unspecified limb: Secondary | ICD-10-CM | POA: Diagnosis not present

## 2022-04-11 NOTE — Progress Notes (Signed)

## 2022-05-07 ENCOUNTER — Other Ambulatory Visit: Payer: Self-pay | Admitting: Urology

## 2022-05-07 DIAGNOSIS — N3946 Mixed incontinence: Secondary | ICD-10-CM

## 2022-05-21 ENCOUNTER — Ambulatory Visit: Payer: Medicare Other | Admitting: Urology

## 2022-06-25 ENCOUNTER — Other Ambulatory Visit: Payer: Self-pay | Admitting: Internal Medicine

## 2022-06-25 DIAGNOSIS — Z1231 Encounter for screening mammogram for malignant neoplasm of breast: Secondary | ICD-10-CM

## 2022-07-17 ENCOUNTER — Ambulatory Visit
Admission: RE | Admit: 2022-07-17 | Discharge: 2022-07-17 | Disposition: A | Discharge status: Home / Self Care | Payer: Medicare Other | Source: Ambulatory Visit | Attending: Internal Medicine | Admitting: Internal Medicine

## 2022-07-17 DIAGNOSIS — Z1231 Encounter for screening mammogram for malignant neoplasm of breast: Secondary | ICD-10-CM | POA: Insufficient documentation

## 2022-07-19 ENCOUNTER — Emergency Department: Payer: Medicare Other

## 2022-07-19 ENCOUNTER — Other Ambulatory Visit: Payer: Self-pay

## 2022-07-19 ENCOUNTER — Emergency Department
Admission: EM | Admit: 2022-07-19 | Discharge: 2022-07-19 | Disposition: A | Discharge status: Home / Self Care | Payer: Medicare Other | Attending: Emergency Medicine | Admitting: Emergency Medicine

## 2022-07-19 DIAGNOSIS — E860 Dehydration: Secondary | ICD-10-CM | POA: Insufficient documentation

## 2022-07-19 DIAGNOSIS — E119 Type 2 diabetes mellitus without complications: Secondary | ICD-10-CM | POA: Insufficient documentation

## 2022-07-19 DIAGNOSIS — I10 Essential (primary) hypertension: Secondary | ICD-10-CM | POA: Insufficient documentation

## 2022-07-19 DIAGNOSIS — R251 Tremor, unspecified: Secondary | ICD-10-CM | POA: Diagnosis not present

## 2022-07-19 DIAGNOSIS — R0602 Shortness of breath: Secondary | ICD-10-CM | POA: Insufficient documentation

## 2022-07-19 LAB — CBC WITH DIFFERENTIAL/PLATELET
Abs Immature Granulocytes: 0.02 10*3/uL (ref 0.00–0.07)
Basophils Absolute: 0 10*3/uL (ref 0.0–0.1)
Basophils Relative: 0 %
Eosinophils Absolute: 0 10*3/uL (ref 0.0–0.5)
Eosinophils Relative: 0 %
HCT: 45.7 % (ref 36.0–46.0)
Hemoglobin: 14.9 g/dL (ref 12.0–15.0)
Immature Granulocytes: 0 %
Lymphocytes Relative: 38 %
Lymphs Abs: 2.2 10*3/uL (ref 0.7–4.0)
MCH: 30.2 pg (ref 26.0–34.0)
MCHC: 32.6 g/dL (ref 30.0–36.0)
MCV: 92.5 fL (ref 80.0–100.0)
Monocytes Absolute: 0.4 10*3/uL (ref 0.1–1.0)
Monocytes Relative: 7 %
Neutro Abs: 3.2 10*3/uL (ref 1.7–7.7)
Neutrophils Relative %: 55 %
Platelets: 219 10*3/uL (ref 150–400)
RBC: 4.94 MIL/uL (ref 3.87–5.11)
RDW: 13.3 % (ref 11.5–15.5)
WBC: 5.9 10*3/uL (ref 4.0–10.5)
nRBC: 0 % (ref 0.0–0.2)

## 2022-07-19 LAB — COMPREHENSIVE METABOLIC PANEL
ALT: 14 U/L (ref 0–44)
AST: 24 U/L (ref 15–41)
Albumin: 3.7 g/dL (ref 3.5–5.0)
Alkaline Phosphatase: 54 U/L (ref 38–126)
Anion gap: 14 (ref 5–15)
BUN: 11 mg/dL (ref 8–23)
CO2: 19 mmol/L — ABNORMAL LOW (ref 22–32)
Calcium: 9.2 mg/dL (ref 8.9–10.3)
Chloride: 103 mmol/L (ref 98–111)
Creatinine, Ser: 0.88 mg/dL (ref 0.44–1.00)
GFR, Estimated: >60 mL/min (ref >60)
Glucose, Bld: 253 mg/dL — ABNORMAL HIGH (ref 70–99)
Potassium: 3.2 mmol/L — ABNORMAL LOW (ref 3.5–5.1)
Sodium: 136 mmol/L (ref 135–145)
Total Bilirubin: 0.9 mg/dL (ref 0.3–1.2)
Total Protein: 7.3 g/dL (ref 6.5–8.1)

## 2022-07-19 LAB — URINALYSIS, ROUTINE W REFLEX MICROSCOPIC
Bacteria, UA: NONE SEEN
Bilirubin Urine: NEGATIVE
Glucose, UA: >=500 mg/dL — AB
Hgb urine dipstick: NEGATIVE
Ketones, ur: 20 mg/dL — AB
Nitrite: NEGATIVE
Protein, ur: NEGATIVE mg/dL
Specific Gravity, Urine: 1.021 (ref 1.005–1.030)
pH: 7 (ref 5.0–8.0)

## 2022-07-19 LAB — TROPONIN I (HIGH SENSITIVITY): Troponin I (High Sensitivity): 17 ng/L (ref <18)

## 2022-07-19 MED ORDER — LACTATED RINGERS IV BOLUS
1000.0000 mL | Freq: Once | INTRAVENOUS | Status: AC
  Administered 2022-07-19: 1000 mL via INTRAVENOUS

## 2022-07-19 MED ORDER — IOHEXOL 350 MG/ML SOLN
75.0000 mL | Freq: Once | INTRAVENOUS | Status: AC | PRN
  Administered 2022-07-19: 75 mL via INTRAVENOUS

## 2022-07-19 NOTE — ED Provider Notes (Signed)
[facility] Provider Note    Event Date/Time   First MD Initiated Contact with Patient 07/19/22 0038     (approximate)   History   Tremors   HPI  Crystal Foley is a 71 y.o. female who presents to the ED for evaluation of Tremors   I reviewed DC summary from medical admission in April.  She is a history of DM, HTN, HLD.  She was observed for syncope.  Found to have a ureteral stone.  Patient presents with her husband for evaluation of an episode of trembling.  They reside at a local independent living facility.  Patient reports feeling at her baseline normal throughout the day today until passing a "normal" bowel movement this evening.  She reports that she sometimes gets trembly and shaky after passing stool, but this time lasted longer.  Husband acknowledges that she has done this before, but tonight was novel because the syndrome lasted more than an hour.  By the time I see her, she reports feeling better and less tremulous.  She reports that when she was feeling tremulous she felt "Judithann Graves in my chest" and her husband says they look like she was having trouble breathing at that time.  Patient reports these cardiopulmonary symptoms have resolved and she feels fine now.  She is asking when she can go home during my initial evaluation.  No recent illnesses, diarrhea, emesis.  Husband reports that she Provident drinking of water, and patient reports that she drinks a lot of Diet Coke.   Physical Exam   Triage Vital Signs: ED Triage Vitals  Enc Vitals Group     BP 07/19/22 0020 (!) 145/87     Pulse Rate 07/19/22 0020 (!) 121     Resp 07/19/22 0020 18     Temp 07/19/22 0020 (!) 97.5 F (36.4 C)     Temp Source 07/19/22 0020 Oral     SpO2 07/19/22 0020 100 %     Weight 07/19/22 0022 165 lb (74.8 kg)     Height 07/19/22 0022 '5\' 4"'$  (1.626 m)     Head Circumference --      Peak Flow --      Pain Score 07/19/22 0022 0     Pain Loc --      Pain Edu? --       Excl. in [place]? --     Most recent vital signs: Vitals:   07/19/22 0107 07/19/22 0243  BP: 128/73 125/67  Pulse: (!) 112 (!) 102  Resp: 18 15  Temp:  98.3 F (36.8 C)  SpO2: 99% 97%    General: Awake, no distress.  Pleasant and conversational.  No noted tremulousness.  No tremor with volitional movements. CV:  Good peripheral perfusion.  Tachycardic and regular Resp:  Normal effort.  Abd:  No distention.  MSK:  No deformity noted.  Neuro:  No focal deficits appreciated. Cranial nerves II through XII intact 5/5 strength and sensation in all 4 extremities Other:     ED Results / Procedures / Treatments   Labs (all labs ordered are listed, but only abnormal results are displayed) Labs Reviewed  COMPREHENSIVE METABOLIC PANEL - Abnormal; Notable for the following components:      Result Value   Potassium 3.2 (*)    CO2 19 (*)    Glucose, Bld 253 (*)    All other components within normal limits  URINALYSIS, ROUTINE W REFLEX MICROSCOPIC - Abnormal; Notable for the following components:  Color, Urine STRAW (*)    APPearance CLEAR (*)    Glucose, UA >=500 (*)    Ketones, ur 20 (*)    Leukocytes,Ua TRACE (*)    All other components within normal limits  CBC WITH DIFFERENTIAL/PLATELET  TROPONIN I (HIGH SENSITIVITY)  TROPONIN I (HIGH SENSITIVITY)    EKG Sinus tachycardia with a rate of 122 bpm.  Normal axis and intervals.  Nonspecific ST changes laterally and inferiorly without STEMI.  RADIOLOGY CT chest interpreted by me without evidence of saddle PE  Official radiology report(s): CT Angio Chest PE W and/or Wo Contrast  Result Date: 07/19/2022 CLINICAL DATA:  Shortness of breath and chest pain EXAM: CT ANGIOGRAPHY CHEST WITH CONTRAST TECHNIQUE: Multidetector CT imaging of the chest was performed using the standard protocol during bolus administration of intravenous contrast. Multiplanar CT image reconstructions and MIPs were obtained to evaluate the vascular anatomy.  RADIATION DOSE REDUCTION: This exam was performed according to the departmental dose-optimization program which includes automated exposure control, adjustment of the mA and/or kV according to patient size and/or use of iterative reconstruction technique. CONTRAST:  76m OMNIPAQUE IOHEXOL 350 MG/ML SOLN COMPARISON:  None Available. FINDINGS: Cardiovascular: Contrast injection is sufficient to demonstrate satisfactory opacification of the pulmonary arteries to the segmental level. There is no pulmonary embolus or evidence of right heart strain. The size of the main pulmonary artery is normal. Normal heart size with coronary artery calcification. The course and caliber of the aorta are normal. There is atherosclerotic calcification. No acute aortic syndrome. Mediastinum/Nodes: Small hiatal hernia.  No adenopathy. Lungs/Pleura: Airways are patent. No pleural effusion, lobar consolidation, pneumothorax or pulmonary infarction. Upper Abdomen: Contrast bolus timing is not optimized for evaluation of the abdominal organs. Low-density focus medial to the upper pole of the left kidney is likely an incompletely visualized renal cyst. Musculoskeletal: No chest wall abnormality. No bony spinal canal stenosis. Review of the MIP images confirms the above findings. IMPRESSION: 1. No pulmonary embolus or acute aortic syndrome. 2. Small hiatal hernia. 3. Coronary artery and aortic Atherosclerosis (ICD10-I70.0). Electronically Signed   By: KUlyses JarredM.D.   On: 07/19/2022 01:44    PROCEDURES and INTERVENTIONS:  .1-3 Lead EKG Interpretation  Performed by: SVladimir Crofts MD Authorized by: SVladimir Crofts MD     Interpretation: abnormal     ECG rate:  116   ECG rate assessment: tachycardic     Rhythm: sinus tachycardia     Ectopy: none     Conduction: normal     Medications  lactated ringers bolus 1,000 mL (0 mLs Intravenous Stopped 07/19/22 0239)  iohexol (OMNIPAQUE) 350 MG/ML injection 75 mL (75 mLs Intravenous  Contrast Given 07/19/22 0133)     IMPRESSION / MDM / ASSESSMENT AND PLAN / ED COURSE  I reviewed the triage vital signs and the nursing notes.  Differential diagnosis includes, but is not limited to, ACS, PTX, PNA, muscle strain/spasm, PE, dissection  {Patient presents with symptoms of an acute illness or injury that is potentially life-threatening.  71year old female presents to the ED with recurrence of another episode of tremulousness, lasting longer this time, and ultimately without evidence of acute pathology.  Suitable for outpatient management.  She looks systemically well to me despite her noted sinus tachycardia.  Benign examination otherwise.  Blood work with slight nonanion gap metabolic acidosis with decreased bicarbonate.  Normal CBC, negative troponins.  Urine with ketones suggestive of dehydration, but no infectious features.  CTA chest performed without evidence of acute  cardiopulmonary pathology, such as PE, pneumothorax or pneumonia.  Tachycardia and symptoms resolved after IV fluids.  I considered observation admission for this patient, but ultimately we decided on outpatient management with close return precautions.  Clinical Course as of 07/19/22 0331  Fri Jul 19, 2022  0235 Reassessed.  Patient reports feeling much better.  Tachycardia has resolved.  Asked work-up with stigmata of dehydration considering her ketonuria and mild non-anion gap metabolic acidosis.  Discussed reassuring work-up otherwise.  Urged her to drink more fluids throughout the day.  We discussed appropriate return precautions for the ED.  She is asking to go home, which think is reasonable. [DS]    Clinical Course User Index [DS] Vladimir Crofts, MD     FINAL CLINICAL IMPRESSION(S) / ED DIAGNOSES   Final diagnoses:  Episodes of trembling  Dehydration     Rx / DC Orders   ED Discharge Orders     None        Note:  This document was prepared using Dragon voice recognition software and may  include unintentional dictation errors.   Vladimir Crofts, MD 07/19/22 [phone]

## 2022-07-19 NOTE — ED Triage Notes (Signed)
To triage via [facility] from [facility]. Pt reports she was having BM this evening and became shaky afterwards. Denies chest pain, denies shortness of breath.  Pt reports she sometimes gets these shaking episodes when she tries to hurry up and go to the bathroom and it normally lasts apx 10-15 mins.  Pt also states she " feels like I cant get ahold of myself." Denies hx of anxiety.

## 2022-07-19 NOTE — ED Notes (Signed)
Pt taken to CT.

## 2022-07-19 NOTE — ED Notes (Addendum)
First nurse note: Pt presents via EMS from [facility] for tremors. Per EMS, pt has a hx of anxiety and tremors but this has lasted longer than normal. States that the tremors lasted 20 mins PTA.   VS with EMS:  99% on RA BP-170/39 HR-136 CBG- 211

## 2022-08-12 ENCOUNTER — Encounter: Payer: Self-pay | Admitting: Podiatry

## 2022-08-12 ENCOUNTER — Ambulatory Visit (INDEPENDENT_AMBULATORY_CARE_PROVIDER_SITE_OTHER): Payer: Medicare Other | Admitting: Podiatry

## 2022-08-12 DIAGNOSIS — M79609 Pain in unspecified limb: Secondary | ICD-10-CM | POA: Diagnosis not present

## 2022-08-12 DIAGNOSIS — B351 Tinea unguium: Secondary | ICD-10-CM | POA: Diagnosis not present

## 2022-08-12 DIAGNOSIS — E119 Type 2 diabetes mellitus without complications: Secondary | ICD-10-CM | POA: Diagnosis not present

## 2022-08-12 NOTE — Progress Notes (Signed)

## 2022-08-29 ENCOUNTER — Other Ambulatory Visit: Payer: Self-pay

## 2022-08-29 DIAGNOSIS — N3946 Mixed incontinence: Secondary | ICD-10-CM

## 2022-08-30 MED ORDER — TROSPIUM CHLORIDE ER 60 MG PO CP24: 60.0000 mg | ORAL_CAPSULE | Freq: Every day | ORAL | 0 refills | Status: DC

## 2022-09-25 ENCOUNTER — Ambulatory Visit: Payer: Medicare Other | Admitting: Urology

## 2022-10-15 ENCOUNTER — Encounter: Payer: Self-pay | Admitting: Emergency Medicine

## 2022-10-15 ENCOUNTER — Emergency Department
Admission: EM | Admit: 2022-10-15 | Discharge: 2022-10-15 | Disposition: A | Discharge status: Home / Self Care | Payer: Medicare Other | Attending: Emergency Medicine | Admitting: Emergency Medicine

## 2022-10-15 ENCOUNTER — Other Ambulatory Visit: Payer: Self-pay

## 2022-10-15 DIAGNOSIS — J449 Chronic obstructive pulmonary disease, unspecified: Secondary | ICD-10-CM | POA: Diagnosis not present

## 2022-10-15 DIAGNOSIS — E119 Type 2 diabetes mellitus without complications: Secondary | ICD-10-CM | POA: Insufficient documentation

## 2022-10-15 DIAGNOSIS — R251 Tremor, unspecified: Secondary | ICD-10-CM | POA: Diagnosis not present

## 2022-10-15 DIAGNOSIS — I10 Essential (primary) hypertension: Secondary | ICD-10-CM | POA: Diagnosis not present

## 2022-10-15 LAB — BLOOD GAS, VENOUS
Acid-Base Excess: 2.5 mmol/L — ABNORMAL HIGH (ref 0.0–2.0)
Bicarbonate: 28.4 mmol/L — ABNORMAL HIGH (ref 20.0–28.0)
O2 Saturation: 46.5 %
Patient temperature: 37
pCO2, Ven: 48 mmHg (ref 44–60)
pH, Ven: 7.38 (ref 7.25–7.43)
pO2, Ven: 34 mmHg (ref 32–45)

## 2022-10-15 LAB — CBC
HCT: 45.9 % (ref 36.0–46.0)
Hemoglobin: 15.1 g/dL — ABNORMAL HIGH (ref 12.0–15.0)
MCH: 29.6 pg (ref 26.0–34.0)
MCHC: 32.9 g/dL (ref 30.0–36.0)
MCV: 90 fL (ref 80.0–100.0)
Platelets: 159 10*3/uL (ref 150–400)
RBC: 5.1 MIL/uL (ref 3.87–5.11)
RDW: 13 % (ref 11.5–15.5)
WBC: 6.6 10*3/uL (ref 4.0–10.5)
nRBC: 0 % (ref 0.0–0.2)

## 2022-10-15 LAB — BASIC METABOLIC PANEL
Anion gap: 10 (ref 5–15)
BUN: 11 mg/dL (ref 8–23)
CO2: 23 mmol/L (ref 22–32)
Calcium: 8.7 mg/dL — ABNORMAL LOW (ref 8.9–10.3)
Chloride: 103 mmol/L (ref 98–111)
Creatinine, Ser: 0.67 mg/dL (ref 0.44–1.00)
GFR, Estimated: >60 mL/min (ref >60)
Glucose, Bld: 195 mg/dL — ABNORMAL HIGH (ref 70–99)
Potassium: 3.2 mmol/L — ABNORMAL LOW (ref 3.5–5.1)
Sodium: 136 mmol/L (ref 135–145)

## 2022-10-15 LAB — URINALYSIS, ROUTINE W REFLEX MICROSCOPIC
Bilirubin Urine: NEGATIVE
Glucose, UA: >=500 mg/dL — AB
Ketones, ur: 80 mg/dL — AB
Nitrite: NEGATIVE
Protein, ur: NEGATIVE mg/dL
Specific Gravity, Urine: 1.023 (ref 1.005–1.030)
pH: 7 (ref 5.0–8.0)

## 2022-10-15 LAB — TROPONIN I (HIGH SENSITIVITY): Troponin I (High Sensitivity): 21 ng/L — ABNORMAL HIGH (ref <18)

## 2022-10-15 LAB — VALPROIC ACID LEVEL: Valproic Acid Lvl: 69 ug/mL (ref 50.0–100.0)

## 2022-10-15 MED ORDER — DIVALPROEX SODIUM ER 250 MG PO TB24
1250.0000 mg | ORAL_TABLET | Freq: Once | ORAL | Status: AC
  Administered 2022-10-15: 1250 mg via ORAL
  Filled 2022-10-15: qty 5

## 2022-10-15 MED ORDER — SODIUM CHLORIDE 0.9 % IV BOLUS
1000.0000 mL | Freq: Once | INTRAVENOUS | Status: AC
  Administered 2022-10-15: 1000 mL via INTRAVENOUS

## 2022-10-15 NOTE — ED Notes (Signed)
Depakote was given by the RN

## 2022-10-15 NOTE — ED Provider Notes (Signed)
[facility] Provider Note    Event Date/Time   First MD Initiated Contact with Patient 10/15/22 0715     (approximate)  History   Chief Complaint: Tremors  HPI  Crystal Foley is a 71 y.o. female with a past medical history of COPD, diabetes, hypertension, seizure disorder, presents to the emergency department for tremors.  According to the husband around 1:00 in the morning she called the husband into the room where she was sitting in a chair.  Husband states she was shaking with tremors in both of her upper extremities.  Husband states this is happened before the last time she was on glipizide, they took her off this medication and the tremors stopped however she recently restarted the medication and this morning was the first time that the tremors had returned.  During the episode which lasted approximately 30 minutes husband states the patient was less responsive she was awake but would not answer most questions.  Husband states similar event happened last time that this occurred but was less significant.  Currently patient is awake alert oriented she has no complaints and states she feels well.  Patient denies any chest pain or shortness of breath at any point.  Denies any headache.   Physical Exam   Triage Vital Signs: ED Triage Vitals  Enc Vitals Group     BP 10/15/22 0319 (!) 158/90     Pulse Rate 10/15/22 0319 (!) 107     Resp 10/15/22 0319 18     Temp 10/15/22 0319 98 F (36.7 C)     Temp Source 10/15/22 0319 Oral     SpO2 10/15/22 0319 99 %     Weight 10/15/22 0718 165 lb 5.5 oz (75 kg)     Height 10/15/22 0317 '5\' 4"'$  (1.626 m)     Head Circumference --      Peak Flow --      Pain Score 10/15/22 0317 0     Pain Loc --      Pain Edu? --      Excl. in [place]? --     Most recent vital signs: Vitals:   10/15/22 0319 10/15/22 0729  BP: (!) 158/90   Pulse: (!) 107   Resp: 18   Temp: 98 F (36.7 C) 98.2 F (36.8 C)  SpO2: 99%      General: Awake, no distress.  CV:  Good peripheral perfusion.  Regular rate and rhythm  Resp:  Normal effort.  Equal breath sounds bilaterally.  Abd:  No distention.  Soft, nontender.  No rebound or guarding.   ED Results / Procedures / Treatments   EKG  EKG viewed and interpreted by myself shows sinus tachycardia 113 bpm with a narrow QRS, normal axis, normal intervals, nonspecific but no concerning ST changes.   MEDICATIONS ORDERED IN ED: Medications  divalproex (DEPAKOTE ER) 24 hr tablet 1,250 mg (has no administration in time range)  sodium chloride 0.9 % bolus 1,000 mL (has no administration in time range)     IMPRESSION / MDM / ASSESSMENT AND PLAN / ED COURSE  I reviewed the triage vital signs and the nursing notes.  Patient's presentation is most consistent with acute presentation with potential threat to life or bodily function.  Patient presents emergency department for tremors overnight.  Husband states this is happened multiple times in the past with the patient was taking glipizide.  They took the patient off this medication but recently put her back on  the medication and the tremors appear to have started again.  Husband was able to show me the patient's glucose monitor which continuously monitors the patient's blood glucose.  Around 1 AM it did appear to drop around 70 which did correlate with the patient's symptoms.  Blood sugar is currently 195 on chemistry.  Patient has no complaints.  Patient's work-up shows a normal CBC reassuring chemistry including a normal anion gap troponin of 21 and a Depakote level of 69.  We will dose the patient's morning valproic acid.  Patient's urinalysis does show 80 ketones.  As the patient is diabetic we will IV hydrate and obtain a VBG as a precaution.  Patient agreeable to plan.  States she feels well and is ready to go home.  VBG is reassuring.  Patient appears well.  Vital signs reassuring.  As patient is feeling better discussed  with the patient to follow-up with her PCP regarding her glipizide and whether or not to continue this medication.  Patient has been agreeable to plan of care.  Patient will be discharged shortly.  FINAL CLINICAL IMPRESSION(S) / ED DIAGNOSES   Tremor    Note:  This document was prepared using Dragon voice recognition software and may include unintentional dictation errors.   Harvest Dark, MD 10/15/22 [phone]

## 2022-10-15 NOTE — ED Triage Notes (Addendum)
EMS brings pt in from [facility]; st last several wks, recent med changes; hx diabetes, seizures, COPD; tonight having tremors (none noted at present); c/o only of increased thirst

## 2022-10-15 NOTE — Discharge Instructions (Signed)
Please drink plenty of fluids and obtain plenty of rest.  Please follow-up with your doctor regarding your glipizide medication and whether or not to continue this medicine.  Return to the emergency department for any symptom personally concerning to yourself.

## 2022-11-18 ENCOUNTER — Ambulatory Visit (INDEPENDENT_AMBULATORY_CARE_PROVIDER_SITE_OTHER): Payer: Medicare Other | Admitting: Podiatry

## 2022-11-18 ENCOUNTER — Encounter: Payer: Self-pay | Admitting: Podiatry

## 2022-11-18 VITALS — BP 132/89 | HR 99

## 2022-11-18 DIAGNOSIS — E119 Type 2 diabetes mellitus without complications: Secondary | ICD-10-CM | POA: Diagnosis not present

## 2022-11-18 DIAGNOSIS — M79609 Pain in unspecified limb: Secondary | ICD-10-CM | POA: Diagnosis not present

## 2022-11-18 DIAGNOSIS — B351 Tinea unguium: Secondary | ICD-10-CM

## 2022-11-18 NOTE — Progress Notes (Signed)

## 2022-11-30 ENCOUNTER — Other Ambulatory Visit: Payer: Self-pay | Admitting: Urology

## 2022-11-30 DIAGNOSIS — N3946 Mixed incontinence: Secondary | ICD-10-CM

## 2022-12-04 NOTE — Progress Notes (Unsigned)
12/05/2022 10:18 PM   Crystal Foley July 11, 1951 [id number]  Referring provider: [name], MD [place] [facility] [place],  [place] [address]  Urological history: 1. Nephrolithiasis -stone composition 99% calcium oxalate monohydrate and 1% calcium phosphate carbonate -24 hour in 2016 noted extremely low urine volume, borderline hyperoxaluria, high calcium oxalate stone risk, mild calcium phosphate stone risk and mild uric acid supersaturation -URS x 1 in 2016 -most recent episode of stone in 04/2020 with the spontaneous passage of a right 3 mm stone -KUB 2023 bilateral nephrolithiasis   2. High risk hematuria -non-smoker -non-contrast CT's x several - nephrolithiasis and bilateral peripelvic cysts  -cysto 2016 NED -secondary to nephrolithiasis -no reports of gross heme -UA > 30 RBC's   3. Incontinence -at goal by avoiding diet sodas -completed series of PTNS  -PVR 1  mL  -Trospium XL 60 mg daily   4. Renal cysts -RUS 2022 - numerous large parapelvic cysts. Overall appearance of parapelvic cysts and renal configuration is stable as compared with prior CT and ultrasound.  HPI: Crystal Foley is a 71 y.o. female who presents today for follow up.    She experiencing 1-7 daytime urinations, 1-2 nighttime urinations and a mild urge to urinate.  She wears 1 absorbent pad when she is out of the house.  Patient denies any modifying or aggravating factors.  Patient denies any gross hematuria, dysuria or suprapubic/flank pain.  Patient denies any fevers, chills, nausea or vomiting.    She has been seen in the ED 2 times since her last visit with [place] for tremors.    UA yellow cloudy, 3+ glucose, ketone trace, specific gravity 1.015, 3+ blood, 0-5 WBCs, greater than 30 RBCs, greater than 10 epithelial cells and moderate bacteria.  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[place])    Hematuria, gross    Hydronephrosis    Hypercholesterolemia    Hypertension    Hypothyroidism    Nephrolithiasis    Nephrolithiasis    Seizures ([place])    last seizure 7-8 yrs ago   Shortness of breath dyspnea    with exertion    Surgical History: Past Surgical History:  Procedure Laterality Date   Bladder tack     COLONOSCOPY     COLONOSCOPY WITH PROPOFOL N/A 03/25/2018   Procedure: COLONOSCOPY WITH PROPOFOL;  Surgeon: Toledo, Benay Pike, MD;  Location: [facility] ENDOSCOPY;  Service: Gastroenterology;  Laterality: N/A;   COLONOSCOPY WITH PROPOFOL N/A 05/13/2018   Procedure: COLONOSCOPY WITH PROPOFOL;  Surgeon: Toledo, Benay Pike, MD;  Location: [facility] ENDOSCOPY;  Service: Gastroenterology;  Laterality: N/A;   CYSTOSCOPY W/ URETERAL STENT PLACEMENT  03/11/15   CYSTOSCOPY WITH RETROGRADE PYELOGRAM, URETEROSCOPY AND STENT PLACEMENT Left 03/17/2015   Procedure: CYSTOSCOPY, STENT REMOVAL, LEFT URETEROSCOPY  WITH STONE REMOVAL WITH BASKET;  Surgeon: Irine Seal, MD;  Location: [facility];  Service: Urology;  Laterality: Left;   SHOULDER ARTHROSCOPY DISTAL CLAVICLE EXCISION AND OPEN ROTATOR CUFF REPAIR     TUBAL LIGATION      Home Medications:  Allergies as of 12/05/2022       Reactions   Dilantin [phenytoin Sodium Extended] Swelling   Glipizide Other (See Comments)   Metformin Diarrhea  Phenytoin Swelling        Medication List        Accurate as of December 05, 2022 10:18 PM. If you have any questions, ask your nurse or doctor.          STOP taking these medications    clotrimazole-betamethasone cream Commonly known as: LOTRISONE Stopped by: Lacheryl Niesen, PA-C   diclofenac sodium 1 % Gel Commonly known as: VOLTAREN Stopped by: Johnatha Zeidman, PA-C   glipiZIDE 5 MG tablet Commonly known as:  GLUCOTROL Stopped by: Danyla Wattley, PA-C       TAKE these medications    albuterol 108 (90 Base) MCG/ACT inhaler Commonly known as: VENTOLIN HFA Inhale 1 puff into the lungs every 6 (six) hours as needed for wheezing or shortness of breath.   atorvastatin 40 MG tablet Commonly known as: LIPITOR Take 40 mg by mouth daily.   divalproex 250 MG 24 hr tablet Commonly known as: DEPAKOTE ER Take 500-750 mg by mouth 2 (two) times daily. Take '500mg'$  in morning and take '750mg'$  at night   DSS 100 MG Caps Take by mouth.   estradiol 0.1 MG/GM vaginal cream Commonly known as: ESTRACE VAGINAL Apply 0.'5mg'$  (pea-sized amount)  just inside the vaginal introitus with a finger-tip on Monday, Wednesday and Friday nights.   losartan 100 MG tablet Commonly known as: COZAAR Take 1 tablet by mouth daily.   Rybelsus 7 MG Tabs Generic drug: Semaglutide Take 1 tablet by mouth daily.   Trospium Chloride 60 MG Cp24 Take 1 capsule (60 mg total) by mouth daily.        Allergies:  Allergies  Allergen Reactions   Dilantin [Phenytoin Sodium Extended] Swelling   Glipizide Other (See Comments)   Metformin Diarrhea   Phenytoin Swelling    Family History: Family History  Problem Relation Age of Onset   Coronary artery disease Sister    Heart disease Sister    Brain cancer Father    Emphysema Mother     Social History:  reports that she has never smoked. She has never used smokeless tobacco. She reports that she does not drink alcohol and does not use drugs.  ROS: Pertinent ROS in HPI  Physical Exam: BP (!) 153/92   Pulse 99   Ht '5\' 4"'$  (1.626 m)   Wt 152 lb (68.9 kg)   BMI 26.09 kg/m   Constitutional:  Well nourished. Alert and oriented, No acute distress. HEENT: [place] AT, moist mucus membranes.  Trachea midline Cardiovascular: No clubbing, cyanosis, or edema. Respiratory: Normal respiratory effort, no increased work of breathing. Neurologic: Grossly intact, no focal deficits, moving  all 4 extremities. Psychiatric: Normal mood and affect.    Laboratory Data: Serum creatinine (10/2022) 0.8 Hemoglobin A1c (10/2022) 7.5 Urinalysis See EPIC and HPI  I have reviewed the labs.   Pertinent Imaging:    12/05/22 15:37  Scan Result 1    Assessment & Plan:    1. Nephrolithiasis -asymptomatic   2. High risk hematuria -likely secondary to nephrolithiasis -several RUS and non-contrast CT - positive for stone -cysto 2016 NED -no reports of gross heme -UA > 30 RBC's -was going to send urine for culture, but lab services had left for the day -she is asymptomatic, so we will reassess when she returns for a cath UA  3. Incontinence -at goal w/ trospium XL 60 mg daily  4. Renal cysts  - benign  5.  Vaginal atrophy/urethral caruncle  -continue vaginal estrogen cream   Return  in about 2 months (around 02/05/2023) for CATH UA .  These notes generated with voice recognition software. I apologize for typographical errors.  [place], [place] [address]  [place], [place] [address] [phone]

## 2022-12-05 ENCOUNTER — Ambulatory Visit (INDEPENDENT_AMBULATORY_CARE_PROVIDER_SITE_OTHER): Payer: Medicare Other | Admitting: Urology

## 2022-12-05 ENCOUNTER — Encounter: Payer: Self-pay | Admitting: Urology

## 2022-12-05 VITALS — BP 153/92 | HR 99 | Ht 64.0 in | Wt 152.0 lb

## 2022-12-05 DIAGNOSIS — N39498 Other specified urinary incontinence: Secondary | ICD-10-CM | POA: Diagnosis not present

## 2022-12-05 DIAGNOSIS — R319 Hematuria, unspecified: Secondary | ICD-10-CM

## 2022-12-05 DIAGNOSIS — N281 Cyst of kidney, acquired: Secondary | ICD-10-CM

## 2022-12-05 DIAGNOSIS — N200 Calculus of kidney: Secondary | ICD-10-CM | POA: Diagnosis not present

## 2022-12-05 DIAGNOSIS — N3946 Mixed incontinence: Secondary | ICD-10-CM

## 2022-12-05 DIAGNOSIS — N952 Postmenopausal atrophic vaginitis: Secondary | ICD-10-CM

## 2022-12-05 LAB — URINALYSIS, COMPLETE
Bilirubin, UA: NEGATIVE
Leukocytes,UA: NEGATIVE
Nitrite, UA: NEGATIVE
Protein,UA: NEGATIVE
Specific Gravity, UA: 1.015 (ref 1.005–1.030)
Urobilinogen, Ur: 1 mg/dL (ref 0.2–1.0)
pH, UA: 6 (ref 5.0–7.5)

## 2022-12-05 LAB — MICROSCOPIC EXAMINATION
Epithelial Cells (non renal): >10 /hpf — AB (ref 0–10)
RBC, Urine: >30 /hpf — AB (ref 0–2)

## 2022-12-05 LAB — BLADDER SCAN AMB NON-IMAGING: Scan Result: 1

## 2022-12-05 MED ORDER — TROSPIUM CHLORIDE ER 60 MG PO CP24: 60.0000 mg | ORAL_CAPSULE | Freq: Every day | ORAL | 3 refills | Status: DC

## 2023-02-05 NOTE — Progress Notes (Signed)
02/06/2023 10:28 AM   [name] Aug 07, 1951 [id number]  Referring provider: [name], MD [place] [facility] [place],  [place] [address]  Urological history: 1. Nephrolithiasis -stone composition 99% calcium oxalate monohydrate and 1% calcium phosphate carbonate -24 hour in 2016 noted extremely low urine volume, borderline hyperoxaluria, high calcium oxalate stone risk, mild calcium phosphate stone risk and mild uric acid supersaturation -URS x 1 in 2016 -most recent episode of stone in 04/2020 with the spontaneous passage of a right 3 mm stone -KUB 2023 bilateral nephrolithiasis   2. High risk hematuria -non-smoker -non-contrast CT's x several - nephrolithiasis and bilateral peripelvic cysts  -cysto 2016 NED -secondary to nephrolithiasis -no reports of gross heme -CATH UA 3-10 RBC's  3. Incontinence -at goal by avoiding diet sodas -completed series of PTNS  -PVR 110 mL  -Trospium XL 60 mg daily   4. Renal cysts -RUS 2022 - numerous large parapelvic cysts. Overall appearance of parapelvic cysts and renal configuration is stable as compared with prior CT and ultrasound.  HPI: Crystal Foley is a 72 y.o. female who presents today for follow up.    She is having 7 daytime urinations, 1 episode of nocturia with a mild urge to urinate.  She has urinary leakage on occasion, but mostly when she is away from her house for a long time.  She uses pantiliners as protection.  She does engage in toilet mapping.  Patient denies any modifying or aggravating factors.  Patient denies any gross hematuria, dysuria or suprapubic/flank pain.  Patient denies any fevers, chills, nausea or vomiting.    PVR 110 mL  CATH UA yellow clear, 3+ glucose, specific gravity 1.020, trace blood, pH of 5.5, 3-10 RBCs, 0-5 WBCs, 0-10 epithelial cells and few bacteria with mucus threads.  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[place])    Hematuria, gross    Hydronephrosis    Hypercholesterolemia    Hypertension    Hypothyroidism    Nephrolithiasis    Nephrolithiasis    Seizures ([place])    last seizure 7-8 yrs ago   Shortness of breath dyspnea    with exertion    Surgical History: Past Surgical History:  Procedure Laterality Date   Bladder tack     COLONOSCOPY     COLONOSCOPY WITH PROPOFOL N/A 03/25/2018   Procedure: COLONOSCOPY WITH PROPOFOL;  Surgeon: Toledo, Benay Pike, MD;  Location: [facility];  Service: Gastroenterology;  Laterality: N/A;   COLONOSCOPY WITH PROPOFOL N/A 05/13/2018   Procedure: COLONOSCOPY WITH PROPOFOL;  Surgeon: Toledo, Benay Pike, MD;  Location: [facility];  Service: Gastroenterology;  Laterality: N/A;   CYSTOSCOPY W/ URETERAL STENT PLACEMENT  03/11/15   CYSTOSCOPY WITH RETROGRADE PYELOGRAM, URETEROSCOPY AND STENT PLACEMENT Left 03/17/2015   Procedure: CYSTOSCOPY, STENT REMOVAL, LEFT URETEROSCOPY  WITH STONE REMOVAL WITH BASKET;  Surgeon: Irine Seal, MD;  [facility];  Service: Urology;  Laterality: Left;   SHOULDER ARTHROSCOPY DISTAL CLAVICLE EXCISION AND OPEN ROTATOR CUFF REPAIR     TUBAL LIGATION      Home Medications:  Allergies as of 02/06/2023       Reactions   Dilantin [phenytoin Sodium Extended] Swelling   Glipizide Other (See  Comments)   Metformin Diarrhea   Phenytoin Swelling        Medication List        Accurate as of February 06, 2023 10:28 AM. If you have any questions, ask your nurse or doctor.          albuterol 108 (90 Base) MCG/ACT inhaler Commonly known as: VENTOLIN HFA Inhale 1 puff into the lungs every 6 (six) hours as needed for wheezing or shortness of breath.   atorvastatin 40 MG tablet Commonly known as: LIPITOR Take 40 mg by  mouth daily.   divalproex 250 MG 24 hr tablet Commonly known as: DEPAKOTE ER Take 500-750 mg by mouth 2 (two) times daily. Take '500mg'$  in morning and take '750mg'$  at night   DSS 100 MG Caps Take by mouth.   estradiol 0.1 MG/GM vaginal cream Commonly known as: ESTRACE VAGINAL Apply 0.'5mg'$  (pea-sized amount)  just inside the vaginal introitus with a finger-tip on Monday, Wednesday and Friday nights.   losartan 100 MG tablet Commonly known as: COZAAR Take 1 tablet by mouth daily.   Rybelsus 7 MG Tabs Generic drug: Semaglutide Take 1 tablet by mouth daily.   Trospium Chloride 60 MG Cp24 Take 1 capsule (60 mg total) by mouth daily.        Allergies:  Allergies  Allergen Reactions   Dilantin [Phenytoin Sodium Extended] Swelling   Glipizide Other (See Comments)   Metformin Diarrhea   Phenytoin Swelling    Family History: Family History  Problem Relation Age of Onset   Coronary artery disease Sister    Heart disease Sister    Brain cancer Father    Emphysema Mother     Social History:  reports that she has never smoked. She has never used smokeless tobacco. She reports that she does not drink alcohol and does not use drugs.  ROS: Pertinent ROS in HPI  Physical Exam: BP (!) 165/97   Pulse (!) 114   Ht '5\' 4"'$  (1.626 m)   Wt 148 lb (67.1 kg)   BMI 25.40 kg/m   Constitutional:  Well nourished. Alert and oriented, No acute distress. HEENT: [place] AT, moist mucus membranes.  Trachea midline Cardiovascular: No clubbing, cyanosis, or edema. Respiratory: Normal respiratory effort, no increased work of breathing. Neurologic: Grossly intact, no focal deficits, moving all 4 extremities. Psychiatric: Normal mood and affect.    Laboratory Data: Urinalysis See EPIC and HPI  I have reviewed the labs.   Pertinent Imaging:   CLINICAL DATA:  Abdominal pain.   EXAM: ABDOMEN - 1 VIEW   COMPARISON:  03/26/2022   FINDINGS: The bowel gas pattern is normal without gaseous  distention. No radio-opaque calculi or other significant radiographic abnormality are seen. Increased stool noted consistent with constipation.   IMPRESSION: Constipation.     Electronically Signed   By: Sammie Bench M.D.   On: 02/07/2023 20:52  I have independently reviewed the films.  See HPI.    Assessment & Plan:    1. Nephrolithiasis -asymptomatic   2. High risk hematuria -likely secondary to nephrolithiasis -several RUS and non-contrast CT - positive for stone -cysto 2016 NED -no reports of gross heme -CATH UA 3-10 RBC's -We discussed that today's micro heme may be the result of just having it in and out cath or due to nephrolithiasis  3. Incontinence -at goal w/ trospium XL 60 mg daily  4. Renal cysts  - benign  5.  Vaginal atrophy/urethral caruncle  -continue vaginal estrogen cream  Return in about 3 months (around 05/07/2023) for KUB, UA and OV .  These notes generated with voice recognition software. I apologize for typographical errors.  [place], [place] [address]  [place], [place] [address] [phone]

## 2023-02-06 ENCOUNTER — Ambulatory Visit
Admission: RE | Admit: 2023-02-06 | Discharge: 2023-02-06 | Disposition: A | Discharge status: Home / Self Care | Payer: Medicare Other | Attending: Urology | Admitting: Urology

## 2023-02-06 ENCOUNTER — Ambulatory Visit (INDEPENDENT_AMBULATORY_CARE_PROVIDER_SITE_OTHER): Payer: Medicare Other | Admitting: Urology

## 2023-02-06 ENCOUNTER — Ambulatory Visit
Admission: RE | Admit: 2023-02-06 | Discharge: 2023-02-06 | Disposition: A | Discharge status: Home / Self Care | Payer: Medicare Other | Source: Ambulatory Visit | Attending: Urology | Admitting: Urology

## 2023-02-06 ENCOUNTER — Encounter: Payer: Self-pay | Admitting: Urology

## 2023-02-06 VITALS — BP 165/97 | HR 114 | Ht 64.0 in | Wt 148.0 lb

## 2023-02-06 DIAGNOSIS — N200 Calculus of kidney: Secondary | ICD-10-CM

## 2023-02-06 DIAGNOSIS — N39498 Other specified urinary incontinence: Secondary | ICD-10-CM

## 2023-02-06 DIAGNOSIS — R32 Unspecified urinary incontinence: Secondary | ICD-10-CM | POA: Diagnosis not present

## 2023-02-06 DIAGNOSIS — N952 Postmenopausal atrophic vaginitis: Secondary | ICD-10-CM

## 2023-02-06 DIAGNOSIS — R319 Hematuria, unspecified: Secondary | ICD-10-CM

## 2023-02-06 DIAGNOSIS — N281 Cyst of kidney, acquired: Secondary | ICD-10-CM | POA: Diagnosis not present

## 2023-02-06 LAB — URINALYSIS, COMPLETE
Bilirubin, UA: NEGATIVE
Ketones, UA: NEGATIVE
Leukocytes,UA: NEGATIVE
Nitrite, UA: NEGATIVE
Protein,UA: NEGATIVE
Specific Gravity, UA: 1.02 (ref 1.005–1.030)
Urobilinogen, Ur: 0.2 mg/dL (ref 0.2–1.0)
pH, UA: 5.5 (ref 5.0–7.5)

## 2023-02-06 LAB — MICROSCOPIC EXAMINATION

## 2023-02-06 NOTE — Progress Notes (Signed)
In and Out Catheterization  Patient is present today for a I & O catheterization due to cath UA needed-jhematuria. Patient was cleaned and prepped in a sterile fashion with betadine . A 14 FR cath was inserted no complications were noted , 160m of urine return was noted, urine was yellow in color. A clean urine sample was collected for Casth UA sample for hematuria. Bladder was drained  And catheter was removed with out difficulty.    Performed by: ATiffany Kocher RMA  Follow up/ Additional notes:

## 2023-02-20 ENCOUNTER — Encounter: Payer: Self-pay | Admitting: Podiatry

## 2023-02-20 ENCOUNTER — Ambulatory Visit (INDEPENDENT_AMBULATORY_CARE_PROVIDER_SITE_OTHER): Payer: Medicare Other | Admitting: Podiatry

## 2023-02-20 DIAGNOSIS — B351 Tinea unguium: Secondary | ICD-10-CM

## 2023-02-20 DIAGNOSIS — M79609 Pain in unspecified limb: Secondary | ICD-10-CM | POA: Diagnosis not present

## 2023-02-20 DIAGNOSIS — E119 Type 2 diabetes mellitus without complications: Secondary | ICD-10-CM

## 2023-02-20 NOTE — Progress Notes (Signed)
This patient returns to my office for at risk foot care.  This patient requires this care by a professional since this patient will be at risk due to having diabetes.  This patient is unable to cut nails herself since the patient cannot reach her nails.These nails are painful walking and wearing shoes.  This patient presents for at risk foot care today.  General Appearance  Alert, conversant and in no acute stress.  Vascular  Dorsalis pedis and posterior tibial  pulses are palpable  bilaterally.  Capillary return is within normal limits  bilaterally. Temperature is within normal limits  bilaterally.  Neurologic  Senn-Weinstein monofilament wire test within normal limits  bilaterally. Muscle power within normal limits bilaterally.  Nails Thick disfigured discolored nails with subungual debris  from hallux to fifth toes bilaterally. No evidence of bacterial infection or drainage bilaterally.  Orthopedic  No limitations of motion  feet .  No crepitus or effusions noted.  No bony pathology or digital deformities noted.  Skin  normotropic skin with no porokeratosis noted bilaterally.  No signs of infections or ulcers noted.     Onychomycosis  Pain in right toes  Pain in left toes  Consent was obtained for treatment procedures.   Mechanical debridement of nails 1-5  bilaterally performed with a nail nipper.  Filed with dremel without incident.    Return office visit   4 months                   Told patient to return for periodic foot care and evaluation due to potential at risk complications.   Gardiner Barefoot DPM

## 2023-03-26 ENCOUNTER — Emergency Department
Admission: EM | Admit: 2023-03-26 | Discharge: 2023-03-26 | Disposition: A | Discharge status: Home / Self Care | Payer: Medicare Other | Attending: Emergency Medicine | Admitting: Emergency Medicine

## 2023-03-26 ENCOUNTER — Encounter: Payer: Self-pay | Admitting: Intensive Care

## 2023-03-26 ENCOUNTER — Other Ambulatory Visit: Payer: Self-pay

## 2023-03-26 DIAGNOSIS — L03113 Cellulitis of right upper limb: Secondary | ICD-10-CM | POA: Diagnosis not present

## 2023-03-26 DIAGNOSIS — E871 Hypo-osmolality and hyponatremia: Secondary | ICD-10-CM

## 2023-03-26 DIAGNOSIS — L0390 Cellulitis, unspecified: Secondary | ICD-10-CM

## 2023-03-26 DIAGNOSIS — M25511 Pain in right shoulder: Secondary | ICD-10-CM | POA: Diagnosis present

## 2023-03-26 LAB — COMPREHENSIVE METABOLIC PANEL
ALT: 13 U/L (ref 0–44)
AST: 25 U/L (ref 15–41)
Albumin: 3.4 g/dL — ABNORMAL LOW (ref 3.5–5.0)
Alkaline Phosphatase: 53 U/L (ref 38–126)
Anion gap: 11 (ref 5–15)
BUN: 9 mg/dL (ref 8–23)
CO2: 19 mmol/L — ABNORMAL LOW (ref 22–32)
Calcium: 8.2 mg/dL — ABNORMAL LOW (ref 8.9–10.3)
Chloride: 95 mmol/L — ABNORMAL LOW (ref 98–111)
Creatinine, Ser: 0.64 mg/dL (ref 0.44–1.00)
GFR, Estimated: >60 mL/min (ref >60)
Glucose, Bld: 171 mg/dL — ABNORMAL HIGH (ref 70–99)
Potassium: 4.5 mmol/L (ref 3.5–5.1)
Sodium: 125 mmol/L — ABNORMAL LOW (ref 135–145)
Total Bilirubin: 0.4 mg/dL (ref 0.3–1.2)
Total Protein: 7 g/dL (ref 6.5–8.1)

## 2023-03-26 LAB — URINALYSIS, ROUTINE W REFLEX MICROSCOPIC
Bacteria, UA: NONE SEEN
Bilirubin Urine: NEGATIVE
Glucose, UA: >=500 mg/dL — AB
Hgb urine dipstick: NEGATIVE
Ketones, ur: 20 mg/dL — AB
Nitrite: NEGATIVE
Protein, ur: NEGATIVE mg/dL
Specific Gravity, Urine: 1.033 — ABNORMAL HIGH (ref 1.005–1.030)
WBC, UA: >50 WBC/hpf (ref 0–5)
pH: 5 (ref 5.0–8.0)

## 2023-03-26 LAB — CBC WITH DIFFERENTIAL/PLATELET
Abs Immature Granulocytes: 0.02 10*3/uL (ref 0.00–0.07)
Basophils Absolute: 0 10*3/uL (ref 0.0–0.1)
Basophils Relative: 0 %
Eosinophils Absolute: 0 10*3/uL (ref 0.0–0.5)
Eosinophils Relative: 0 %
HCT: 45 % (ref 36.0–46.0)
Hemoglobin: 15.2 g/dL — ABNORMAL HIGH (ref 12.0–15.0)
Immature Granulocytes: 0 %
Lymphocytes Relative: 17 %
Lymphs Abs: 1 10*3/uL (ref 0.7–4.0)
MCH: 30.6 pg (ref 26.0–34.0)
MCHC: 33.8 g/dL (ref 30.0–36.0)
MCV: 90.5 fL (ref 80.0–100.0)
Monocytes Absolute: 0.6 10*3/uL (ref 0.1–1.0)
Monocytes Relative: 10 %
Neutro Abs: 4.2 10*3/uL (ref 1.7–7.7)
Neutrophils Relative %: 73 %
Platelets: 161 10*3/uL (ref 150–400)
RBC: 4.97 MIL/uL (ref 3.87–5.11)
RDW: 12.7 % (ref 11.5–15.5)
WBC: 5.8 10*3/uL (ref 4.0–10.5)
nRBC: 0 % (ref 0.0–0.2)

## 2023-03-26 MED ORDER — CEPHALEXIN 500 MG PO CAPS
500.0000 mg | ORAL_CAPSULE | Freq: Once | ORAL | Status: AC
  Administered 2023-03-26: 500 mg via ORAL
  Filled 2023-03-26: qty 1

## 2023-03-26 MED ORDER — CEPHALEXIN 500 MG PO CAPS: 500.0000 mg | ORAL_CAPSULE | Freq: Four times a day (QID) | ORAL | 0 refills | Status: AC

## 2023-03-26 MED ORDER — SODIUM CHLORIDE 0.9 % IV BOLUS
1000.0000 mL | Freq: Once | INTRAVENOUS | Status: AC
  Administered 2023-03-26: 1000 mL via INTRAVENOUS

## 2023-03-26 NOTE — ED Provider Notes (Signed)
[facility] Provider Note    Event Date/Time   First MD Initiated Contact with Patient 03/26/23 1931     (approximate)   History   Redness, swelling, pain  HPI  Crystal Foley is a 72 y.o. female who presents to the emergency department today because of concerns for redness swelling and pain to her right shoulder.  This is where the patient received a COVID shot yesterday.  She has had this shot before without any concerning rashes in the past.  She denies any associated fevers.  However she has had some nausea and vomiting.  Additionally she has felt weak.     Physical Exam   Triage Vital Signs: ED Triage Vitals  Enc Vitals Group     BP 03/26/23 1848 137/86     Pulse Rate 03/26/23 1848 (!) 107     Resp 03/26/23 1848 18     Temp 03/26/23 1846 98.4 F (36.9 C)     Temp Source 03/26/23 1846 Oral     SpO2 03/26/23 1848 98 %     Weight 03/26/23 1847 147 lb (66.7 kg)     Height 03/26/23 1847 5\' 4"  (1.626 m)     Head Circumference --      Peak Flow --      Pain Score 03/26/23 1847 0     Pain Loc --      Pain Edu? --      Excl. in GC? --     Most recent vital signs: Vitals:   03/26/23 1848 03/26/23 2212  BP: 137/86 138/89  Pulse: (!) 107 100  Resp: 18 15  Temp:  98.7 F (37.1 C)  SpO2: 98% 97%   General: Awake, alert, oriented. CV:  Good peripheral perfusion. Regular rate and rhythm. Resp:  Normal effort. Lungs clear. Abd:  No distention. Non tender. Skin:  Circular area of erythema and warm to the right shoulder roughly 4 cm in diameter. No fluctuance. No drainage.   ED Results / Procedures / Treatments   Labs (all labs ordered are listed, but only abnormal results are displayed) Labs Reviewed  CBC WITH DIFFERENTIAL/PLATELET - Abnormal; Notable for the following components:      Result Value   Hemoglobin 15.2 (*)    All other components within normal limits  COMPREHENSIVE METABOLIC PANEL - Abnormal; Notable for the following  components:   Sodium 125 (*)    Chloride 95 (*)    CO2 19 (*)    Glucose, Bld 171 (*)    Calcium 8.2 (*)    Albumin 3.4 (*)    All other components within normal limits  URINALYSIS, ROUTINE W REFLEX MICROSCOPIC - Abnormal; Notable for the following components:   Color, Urine YELLOW (*)    APPearance HAZY (*)    Specific Gravity, Urine 1.033 (*)    Glucose, UA >=500 (*)    Ketones, ur 20 (*)    Leukocytes,Ua MODERATE (*)    All other components within normal limits     EKG  None   RADIOLOGY None  PROCEDURES:  Critical Care performed: No    MEDICATIONS ORDERED IN ED: Medications  cephALEXin (KEFLEX) capsule 500 mg (has no administration in time range)  sodium chloride 0.9 % bolus 1,000 mL (1,000 mLs Intravenous New Bag/Given 03/26/23 2039)     IMPRESSION / MDM / ASSESSMENT AND PLAN / ED COURSE  I reviewed the triage vital signs and the nursing notes.  Differential diagnosis includes, but is not limited to, cellulitis, abscess.  Patient's presentation is most consistent with acute presentation with potential threat to life or bodily function.   Patient presents to the emergency department today because of concerns for redness and erythema to right shoulder.  This is where the patient had a COVID shot.  Exam of that area is consistent with cellulitis.  At this time no fluctuance no drainage.  I have lower suspicion for abscess.  Do think patient would benefit from antibiotics.  Patient blood work was checked.  Patient is hyponatremic here.  Unclear etiology of the hyponatremia at this point.  Patient was given IV fluids.  She stated she did feel better.  At this time I think it is reasonable for patient be discharged home.  I did discuss importance of increasing sodium intake over the next couple days and close follow-up with primary care for recheck.  They state they can call them tomorrow.  Did discuss return precautions.  FINAL CLINICAL  IMPRESSION(S) / ED DIAGNOSES   Final diagnoses:  Cellulitis, unspecified cellulitis site     Note:  This document was prepared using Dragon voice recognition software and may include unintentional dictation errors.    Phineas Semen, MD 03/26/23 [id number]

## 2023-03-26 NOTE — ED Triage Notes (Signed)
Patient had a third covid vaccine injection yesterday into right deltoid. Area around injection site is red, large, and warm to touch.   Patient is feeling shaky, fatigue, and N/V. Reports she has been sleeping ever since getting the vaccine  A&O x4 in triage

## 2023-03-26 NOTE — ED Notes (Addendum)
Pt lives in independent living at [facility]. Pt's husband requested to take patient home instead of arranging transport. Report attempted to [facility].

## 2023-03-26 NOTE — Discharge Instructions (Signed)
Please seek medical attention for any high fevers, chest pain, shortness of breath, change in behavior, persistent vomiting, bloody stool or any other new or concerning symptoms.  

## 2023-05-06 NOTE — Progress Notes (Unsigned)
05/07/2023 9:38 AM   [name] [birth year] [id number]  Referring provider: Lauro Regulus, MD [address] [facility] [place],  [place] [address]  Urological history: 1. Nephrolithiasis -stone composition 99% calcium oxalate monohydrate and 1% calcium phosphate carbonate -24 hour in 2016 noted extremely low urine volume, borderline hyperoxaluria, high calcium oxalate stone risk, mild calcium phosphate stone risk and mild uric acid supersaturation -URS x 1 in 2016 -most recent episode of stone in 04/2020 with the spontaneous passage of a right 3 mm stone -KUB 2023 bilateral nephrolithiasis   2. High risk hematuria -non-smoker -non-contrast CT's x several - nephrolithiasis and bilateral peripelvic cysts  -cysto 2016 NED -secondary to nephrolithiasis  3. Incontinence -at goal by avoiding diet sodas -completed series of PTNS  -Trospium XL 60 mg daily   4. Renal cysts -RUS 2022 - numerous large parapelvic cysts. Overall appearance of parapelvic cysts and renal configuration is stable as compared with prior CT and ultrasound.  HPI: Crystal Foley is a 72 y.o. female who presents today for a 46-month follow-up for KUB, OAB, PVR and UA.  She is having 8 or more daytime voids, nocturia x 1-2 with a mild urge to urinate.  She has an occasional urge incontinence 1-2 times a week.  She does wear absorbent pads if she is going to be away from her house for long period of time.  She wears daily panty liners and she does engage in toilet mapping.  UA yellow slightly cloudy, 3+ glucose, trace ketone, trace leukocyte, greater than 30 WBCs, 0-2 RBCs, 0-2 epithelial cells, mucus threads present and a few bacteria  KUB with constipation making it difficult to evaluate for stones.  She has had an instance where her husband actually had to help pull out the stool.  She has since been in contact with her primary care physician, Dr. Dareen Piano, and she has been taking a  stool softener in the morning and MiraLAX in the evening.  She is having bowel movements every day.  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)    Hematuria, gross    Hydronephrosis    Hypercholesterolemia    Hypertension    Hypothyroidism    Nephrolithiasis    Nephrolithiasis    Seizures (HCC)    last seizure 7-8 yrs ago   Shortness of breath dyspnea    with exertion    Surgical History: Past Surgical History:  Procedure Laterality Date   Bladder tack     COLONOSCOPY     COLONOSCOPY WITH PROPOFOL N/A 03/25/2018   Procedure: COLONOSCOPY WITH PROPOFOL;  Surgeon: Toledo, [name], MD;  Location: [facility];  Service: Gastroenterology;  Laterality: N/A;   COLONOSCOPY WITH PROPOFOL N/A 05/13/2018   Procedure: COLONOSCOPY WITH PROPOFOL;  Surgeon: Toledo, [name], MD;  Location: [facility]COPY;  Service: Gastroenterology;  Laterality: N/A;   CYSTOSCOPY W/ URETERAL STENT PLACEMENT  03/11/15   CYSTOSCOPY WITH RETROGRADE PYELOGRAM, URETEROSCOPY AND STENT PLACEMENT Left 03/17/2015   Procedure: CYSTOSCOPY, STENT REMOVAL, LEFT URETEROSCOPY  WITH STONE REMOVAL WITH BASKET;  Surgeon: Bjorn Pippin, MD;  Location: [facility];  Service: Urology;  Laterality: Left;   SHOULDER ARTHROSCOPY DISTAL CLAVICLE EXCISION AND OPEN ROTATOR CUFF  REPAIR     TUBAL LIGATION      Home Medications:  Allergies as of 05/07/2023       Reactions   Dilantin [phenytoin Sodium Extended] Swelling   Glipizide Other (See Comments)   Metformin Diarrhea   Phenytoin Swelling        Medication List        Accurate as of May 07, 2023  9:38 AM. If you have any questions, ask your nurse or doctor.          albuterol 108 (90 Base) MCG/ACT inhaler Commonly known as: VENTOLIN  HFA Inhale 1 puff into the lungs every 6 (six) hours as needed for wheezing or shortness of breath.   atorvastatin 40 MG tablet Commonly known as: LIPITOR Take 40 mg by mouth daily.   divalproex 250 MG 24 hr tablet Commonly known as: DEPAKOTE ER Take 500-750 mg by mouth 2 (two) times daily. Take 500mg  in morning and take 750mg  at night   DSS 100 MG Caps Take by mouth.   estradiol 0.1 MG/GM vaginal cream Commonly known as: ESTRACE VAGINAL Apply 0.5mg  (pea-sized amount)  just inside the vaginal introitus with a finger-tip on Monday, Wednesday and Friday nights.   Jardiance 10 MG Tabs tablet Generic drug: empagliflozin Take 10 mg by mouth daily.   losartan 100 MG tablet Commonly known as: COZAAR Take 1 tablet by mouth daily.   Semaglutide 14 MG Tabs Take 14 mg by mouth daily. What changed: Another medication with the same name was removed. Continue taking this medication, and follow the directions you see here. Changed by: Michiel Cowboy, PA-C   Trospium Chloride 60 MG Cp24 Take 1 capsule (60 mg total) by mouth daily.        Allergies:  Allergies  Allergen Reactions   Dilantin [Phenytoin Sodium Extended] Swelling   Glipizide Other (See Comments)   Metformin Diarrhea   Phenytoin Swelling    Family History: Family History  Problem Relation Age of Onset   Coronary artery disease Sister    Heart disease Sister    Brain cancer Father    Emphysema Mother     Social History:  reports that she has never smoked. She has never used smokeless tobacco. She reports that she does not drink alcohol and does not use drugs.  ROS: Pertinent ROS in HPI  Physical Exam: BP 135/83 (BP Location: Left Arm, Patient Position: Sitting, Cuff Size: Normal)   Pulse (!) 101   Ht 5\' 4"  (1.626 m)   Wt 146 lb (66.2 kg)   BMI 25.06 kg/m   Constitutional:  Well nourished. Alert and oriented, No acute distress. HEENT: [place] AT, moist mucus membranes.  Trachea midline Cardiovascular: No  clubbing, cyanosis, or edema. Respiratory: Normal respiratory effort, no increased work of breathing. Neurologic: Grossly intact, no focal deficits, moving all 4 extremities. Psychiatric: Normal mood and affect.    Laboratory Data: Serum creatinine of 0.9 with a EGFR of 92 on March 31, 2023 Urinalysis See EPIC and HPI  I have reviewed the labs.   Pertinent Imaging:    05/07/23 09:26  Scan Result 15ml   KUB constipation   I have independently reviewed the films.  See HPI.  Radiologist reading still pending  Assessment & Plan:    1. Nephrolithiasis -asymptomatic  -KUB full of constipation, so could not evaluate for nephrolithiasis  2. High risk hematuria -likely secondary to nephrolithiasis -several RUS and non-contrast CT - positive for stone -cysto 2016 NED -no reports of  gross heme -UA w/o micro heme  3. Incontinence -at goal w/ trospium XL 60 mg daily  4. Renal cysts  - benign  5.  Vaginal atrophy/urethral caruncle  -continue vaginal estrogen cream   6.  Constipation -Advised her to take the stool softener twice daily  Return in about 1 year (around 05/06/2024) for PVR and OAB questionnaire, KUB, UA .  These notes generated with voice recognition software. I apologize for typographical errors.  Cloretta Ned  [facility] [address]  [address] [place], [place] [address] [phone]

## 2023-05-07 ENCOUNTER — Ambulatory Visit
Admission: RE | Admit: 2023-05-07 | Discharge: 2023-05-07 | Disposition: A | Discharge status: Home / Self Care | Payer: Medicare Other | Source: Ambulatory Visit | Attending: Urology | Admitting: Urology

## 2023-05-07 ENCOUNTER — Ambulatory Visit (INDEPENDENT_AMBULATORY_CARE_PROVIDER_SITE_OTHER): Payer: Medicare Other | Admitting: Urology

## 2023-05-07 ENCOUNTER — Encounter: Payer: Self-pay | Admitting: Urology

## 2023-05-07 VITALS — BP 135/83 | HR 101 | Ht 64.0 in | Wt 146.0 lb

## 2023-05-07 DIAGNOSIS — R319 Hematuria, unspecified: Secondary | ICD-10-CM

## 2023-05-07 DIAGNOSIS — R32 Unspecified urinary incontinence: Secondary | ICD-10-CM | POA: Diagnosis not present

## 2023-05-07 DIAGNOSIS — N281 Cyst of kidney, acquired: Secondary | ICD-10-CM

## 2023-05-07 DIAGNOSIS — N952 Postmenopausal atrophic vaginitis: Secondary | ICD-10-CM | POA: Diagnosis not present

## 2023-05-07 DIAGNOSIS — K5909 Other constipation: Secondary | ICD-10-CM

## 2023-05-07 DIAGNOSIS — N39498 Other specified urinary incontinence: Secondary | ICD-10-CM

## 2023-05-07 DIAGNOSIS — N200 Calculus of kidney: Secondary | ICD-10-CM

## 2023-05-07 LAB — MICROSCOPIC EXAMINATION: WBC, UA: >30 /hpf — AB (ref 0–5)

## 2023-05-07 LAB — URINALYSIS, COMPLETE
Bilirubin, UA: NEGATIVE
Nitrite, UA: NEGATIVE
Protein,UA: NEGATIVE
RBC, UA: NEGATIVE
Specific Gravity, UA: 1.02 (ref 1.005–1.030)
Urobilinogen, Ur: 0.2 mg/dL (ref 0.2–1.0)
pH, UA: 5.5 (ref 5.0–7.5)

## 2023-05-07 LAB — BLADDER SCAN AMB NON-IMAGING

## 2023-05-25 ENCOUNTER — Emergency Department
Admission: EM | Admit: 2023-05-25 | Discharge: 2023-05-25 | Disposition: A | Discharge status: Home / Self Care | Payer: Medicare Other | Attending: Emergency Medicine | Admitting: Emergency Medicine

## 2023-05-25 ENCOUNTER — Encounter: Payer: Self-pay | Admitting: Emergency Medicine

## 2023-05-25 ENCOUNTER — Other Ambulatory Visit: Payer: Self-pay

## 2023-05-25 ENCOUNTER — Emergency Department: Payer: Medicare Other

## 2023-05-25 DIAGNOSIS — R109 Unspecified abdominal pain: Secondary | ICD-10-CM | POA: Diagnosis present

## 2023-05-25 DIAGNOSIS — E039 Hypothyroidism, unspecified: Secondary | ICD-10-CM | POA: Diagnosis not present

## 2023-05-25 DIAGNOSIS — J449 Chronic obstructive pulmonary disease, unspecified: Secondary | ICD-10-CM | POA: Insufficient documentation

## 2023-05-25 DIAGNOSIS — I10 Essential (primary) hypertension: Secondary | ICD-10-CM | POA: Insufficient documentation

## 2023-05-25 DIAGNOSIS — N132 Hydronephrosis with renal and ureteral calculous obstruction: Secondary | ICD-10-CM | POA: Insufficient documentation

## 2023-05-25 DIAGNOSIS — E119 Type 2 diabetes mellitus without complications: Secondary | ICD-10-CM | POA: Insufficient documentation

## 2023-05-25 DIAGNOSIS — N200 Calculus of kidney: Secondary | ICD-10-CM

## 2023-05-25 LAB — URINALYSIS, ROUTINE W REFLEX MICROSCOPIC
Bilirubin Urine: NEGATIVE
Glucose, UA: >=500 mg/dL — AB
Ketones, ur: 5 mg/dL — AB
Nitrite: NEGATIVE
Protein, ur: 30 mg/dL — AB
RBC / HPF: >50 RBC/hpf (ref 0–5)
Specific Gravity, Urine: 1.03 (ref 1.005–1.030)
WBC, UA: >50 WBC/hpf (ref 0–5)
pH: 5 (ref 5.0–8.0)

## 2023-05-25 LAB — COMPREHENSIVE METABOLIC PANEL
ALT: 14 U/L (ref 0–44)
AST: 19 U/L (ref 15–41)
Albumin: 3.6 g/dL (ref 3.5–5.0)
Alkaline Phosphatase: 53 U/L (ref 38–126)
Anion gap: 11 (ref 5–15)
BUN: 15 mg/dL (ref 8–23)
CO2: 25 mmol/L (ref 22–32)
Calcium: 8.8 mg/dL — ABNORMAL LOW (ref 8.9–10.3)
Chloride: 99 mmol/L (ref 98–111)
Creatinine, Ser: 0.76 mg/dL (ref 0.44–1.00)
GFR, Estimated: >60 mL/min (ref >60)
Glucose, Bld: 210 mg/dL — ABNORMAL HIGH (ref 70–99)
Potassium: 3.8 mmol/L (ref 3.5–5.1)
Sodium: 135 mmol/L (ref 135–145)
Total Bilirubin: 0.5 mg/dL (ref 0.3–1.2)
Total Protein: 7.3 g/dL (ref 6.5–8.1)

## 2023-05-25 LAB — CBC
HCT: 44.4 % (ref 36.0–46.0)
Hemoglobin: 14.7 g/dL (ref 12.0–15.0)
MCH: 30.9 pg (ref 26.0–34.0)
MCHC: 33.1 g/dL (ref 30.0–36.0)
MCV: 93.5 fL (ref 80.0–100.0)
Platelets: 167 10*3/uL (ref 150–400)
RBC: 4.75 MIL/uL (ref 3.87–5.11)
RDW: 12.8 % (ref 11.5–15.5)
WBC: 5.5 10*3/uL (ref 4.0–10.5)
nRBC: 0 % (ref 0.0–0.2)

## 2023-05-25 LAB — LIPASE, BLOOD: Lipase: 37 U/L (ref 11–51)

## 2023-05-25 MED ORDER — KETOROLAC TROMETHAMINE 30 MG/ML IJ SOLN
15.0000 mg | Freq: Once | INTRAMUSCULAR | Status: AC
  Administered 2023-05-25: 15 mg via INTRAVENOUS
  Filled 2023-05-25: qty 1

## 2023-05-25 MED ORDER — MORPHINE SULFATE (PF) 4 MG/ML IV SOLN
4.0000 mg | Freq: Once | INTRAVENOUS | Status: AC
  Administered 2023-05-25: 4 mg via INTRAVENOUS
  Filled 2023-05-25: qty 1

## 2023-05-25 MED ORDER — SODIUM CHLORIDE 0.9 % IV SOLN
1.0000 g | Freq: Once | INTRAVENOUS | Status: AC
  Administered 2023-05-25: 1 g via INTRAVENOUS
  Filled 2023-05-25: qty 10

## 2023-05-25 MED ORDER — OXYCODONE HCL 5 MG PO TABS: 5.0000 mg | ORAL_TABLET | Freq: Three times a day (TID) | ORAL | 0 refills | Status: AC | PRN

## 2023-05-25 MED ORDER — CEFPODOXIME PROXETIL 200 MG PO TABS: 200.0000 mg | ORAL_TABLET | Freq: Two times a day (BID) | ORAL | 0 refills | Status: AC

## 2023-05-25 NOTE — ED Triage Notes (Signed)
Pt here with left side flank pain. Pt denies NVD. Pt has a hx of kidney stones and a kidney stent.

## 2023-05-25 NOTE — ED Provider Notes (Signed)
[facility] Provider Note    Event Date/Time   First MD Initiated Contact with Patient 05/25/23 1055     (approximate)   History   Flank Pain   HPI  Crystal Foley is a 72 y.o. female  with pmh of kidney stones, epilepsy, diabetes, COPD who presents with flank pain.  Symptoms started around 9 AM today on her way to church.  Initially she thought it was because she needed to have a bowel movement but she did have a bowel movement and her gassy feeling improved but continued to have pain in the left flank.  It is constant does have intermittent periods of worsening.  No nausea vomiting.  Denies dysuria urgency or frequency but did note some blood when she wiped her urine.  Does feel similar to prior kidney stones.  Denies fevers or chills.  Did not take anything for pain prior to come to the ED.     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)    Hematuria, gross    Hydronephrosis    Hypercholesterolemia    Hypertension    Hypothyroidism    Nephrolithiasis    Nephrolithiasis    Seizures (HCC)    last seizure 7-8 yrs ago   Shortness of breath dyspnea    with exertion    Patient Active Problem List   Diagnosis Date Noted   UTI (urinary tract infection) 03/22/2022   Syncope 03/21/2022   Elevated lactic acid level 03/21/2022   Sepsis secondary to UTI (HCC) 03/21/2022   Aortic atherosclerosis (HCC) 12/29/2018   Community acquired pneumonia 12/24/2018   Sepsis (HCC) 12/23/2018   Health care maintenance 09/19/2015   Calculus of kidney 06/01/2015   Hypercholesterolemia without hypertriglyceridemia 09/27/2014   Pure hypercholesterolemia 09/27/2014   Adult BMI 30+ 09/25/2014   Morbid obesity (HCC) 09/25/2014   Severe obesity (BMI  35.0-35.9 with comorbidity) (HCC) 09/25/2014   Asthma 05/15/2014   Benign hypertension 05/15/2014   HLD (hyperlipidemia) 05/15/2014   Seizure (HCC) 05/15/2014   Diabetes mellitus, type 2 (HCC) 05/15/2014   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      Excl. in GC?     Most recent vital signs: Vitals:   05/25/23 1017 05/25/23 1236  BP: (!) 141/89 (!) 145/74  Pulse: (!) 107 87  Resp: 18 18  Temp: (!) 97.5 F (36.4 C) 97.7 F (36.5 C)  SpO2: 97% 99%     General: Awake, no distress.  CV:  Good peripheral perfusion.  Resp:  Normal effort.  Abd:  No distention.  Abdomen is soft and nontender, + left CVA tenderness Neuro:             Awake, Alert, Oriented x 3  Other:     ED Results / Procedures / Treatments  Labs (all labs ordered are listed, but only abnormal results are displayed) Labs Reviewed  COMPREHENSIVE METABOLIC PANEL - Abnormal; Notable for the following components:      Result Value   Glucose, Bld 210 (*)    Calcium 8.8 (*)    All other components within normal limits  URINALYSIS, ROUTINE W REFLEX MICROSCOPIC - Abnormal; Notable for the following components:   Color, Urine YELLOW (*)    APPearance CLOUDY (*)    Glucose, UA >=500 (*)    Hgb urine dipstick LARGE (*)    Ketones, ur 5 (*)    Protein, ur 30 (*)    Leukocytes,Ua LARGE (*)    Bacteria, UA FEW (*)    All other components within normal limits  URINE CULTURE  LIPASE, BLOOD  CBC     EKG     RADIOLOGY I reviewed interpreted patient CT renal study which shows a left-sided  stone   PROCEDURES:  Critical Care performed: No  Procedures   MEDICATIONS ORDERED IN ED: Medications  morphine (PF) 4 MG/ML injection 4 mg (4 mg Intravenous Given 05/25/23 1133)  cefTRIAXone (ROCEPHIN) 1 g in sodium chloride 0.9 % 100 mL IVPB (1 g Intravenous New Bag/Given 05/25/23 1137)  ketorolac (TORADOL) 30 MG/ML injection 15 mg (15 mg Intravenous Given 05/25/23 1233)     IMPRESSION / MDM / ASSESSMENT AND PLAN / ED COURSE  I reviewed the triage vital signs and the nursing notes.                              Patient's presentation is most consistent with acute complicated illness / injury requiring diagnostic workup.  Differential diagnosis includes, but is not limited to, kidney stone, pyelonephritis, musculoskeletal pain, AAA, renal infarct  Patient is a 72 year old female with history of multiple kidney stones who presents with left flank pain since 9 AM today.  This is associated with some hematuria.  No fevers chills nausea vomiting.  Patient's temp is on the low side and she is mildly tachycardic on arrival.  She looks nontoxic.  Does have CVA tenderness but benign abdomen.  Urinalysis with greater than 50 red cells and white cells and few bacteria.  Strongly suspect nephrolithiasis given patient's history and clinical presentation.  Will obtain CT renal study to evaluate size and location of stone.  Will give morphine for pain.  I have added on a urine culture but clinically I have low suspicion for sepsis.  Patient CT renal study shows a left 4 mm stone just proximal to the UPJ.  She has no leukocytosis no fever and her tachycardia and temperature have resolved on repeat check.  I have low suspicion for septic stone at this time.  Will give a dose of ceftriaxone.  On reassessment patient is feeling improved pain is resolved.  As patient's pain is controlled and stone is relatively small and distal and she is not clinically septic I do think she can be discharged. I Encourage  close urology follow-up.  Discussed strict return precautions for fever altered mental status intractable pain or vomiting.  I recommended OTC Tylenol and ibuprofen for pain.  Will prescribe oxycodone for intractable pain.  Discussed increasing  her MiraLAX given she is constipated at baseline.       FINAL CLINICAL IMPRESSION(S) / ED DIAGNOSES   Final diagnoses:  Kidney stone     Rx / DC Orders   ED Discharge Orders          Ordered    oxyCODONE (ROXICODONE) 5 MG immediate release tablet  Every 8 hours PRN        05/25/23 1237    cefpodoxime (VANTIN) 200 MG tablet  2 times daily        05/25/23 1237             Note:  This document was prepared using Dragon voice recognition software and may include unintentional dictation errors.   Georga Hacking, MD 05/25/23 [phone]

## 2023-05-25 NOTE — Discharge Instructions (Addendum)
You have a 4 mm kidney stone on the left side.  Please take the antibiotic twice a day for the next 7 days as you may have a urinary tract infection.  You can take ibuprofen 400 mg every 6-8 hours for pain in addition to Tylenol.  If you have intractable pain you can take the oxycodone.  Please increase your MiraLAX to twice a day to prevent constipation.  It is very important that if you develop fevers, confusion or your pain is not controlled or you are vomiting and cannot keep fluids down then please return to the emergency department.

## 2023-05-26 LAB — URINE CULTURE: Culture: <10000 — AB

## 2023-06-01 NOTE — Progress Notes (Unsigned)
06/02/2023 9:59 AM   Crystal Foley 1951-04-30 [id number]  Referring provider: Lauro Regulus, MD [address] [facility] [place],  [place] [address]  Urological history: 1. Nephrolithiasis -stone composition 99% calcium oxalate monohydrate and 1% calcium phosphate carbonate -24 hour in 2016 noted extremely low urine volume, borderline hyperoxaluria, high calcium oxalate stone risk, mild calcium phosphate stone risk and mild uric acid supersaturation -URS x 1 in 2016 -most recent episode of stone in 04/2020 with the spontaneous passage of a right 3 mm stone -KUB 2023 bilateral nephrolithiasis   2. High risk hematuria -non-smoker -non-contrast CT's x several - nephrolithiasis and bilateral peripelvic cysts  -cysto 2016 NED -secondary to nephrolithiasis  3. Incontinence -at goal by avoiding diet sodas -completed series of PTNS  -Trospium XL 60 mg daily   4. Renal cysts -RUS 2022 - numerous large parapelvic cysts. Overall appearance of parapelvic cysts and renal configuration is stable as compared with prior CT and ultrasound.  HPI: Crystal Foley is a 72 y.o. female who presents today for follow up from ED visit.    She was seen in the ED on 05/25/2023 for left flank pain and gross heme.  CT scan revealed a 4 mm left ureteral stone with mild hydroureteronephrosis with additional non obstructing stones.  Serum creatinine 0.76, eGFR >60, WBC count 5.5, UA yellow cloudy, specific gravity 1.030, pH 5.0, glucose > 500, large heme, 5 ketones, 30 protein, large leukocytes, > 50 RBC's, > 50 WBC's, few bacteria, 11-20 squamous epithelial cells, WBC clumps present and mucus present.  Urine culture < 10,000 colonies.  He was discharged on OTC Tylenol, ibuprofen, oxycodone and to increase her MiraLAX.   On Monday, June 10, she went to have a bowel movement and had excruciating left lower quadrant pain.  She states that it felt like she was passing glass.  She also  noted blood in her urine.  She believes she may have passed the stone at that time, but she did not see a fragment retrieve a fragment due to the large amount of stool in the toilet.  She has been pain-free and without gross hematuria since that time.  Patient denies any modifying or aggravating factors.  Patient denies any recent UTI's, gross hematuria, dysuria or suprapubic/flank pain.  Patient denies any fevers, chills, nausea or vomiting.   KUB difficult to evaluate for stone secondary to constipation.  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)    Hematuria, gross    Hydronephrosis    Hypercholesterolemia    Hypertension    Hypothyroidism    Nephrolithiasis    Nephrolithiasis    Seizures (HCC)    last seizure 7-8 yrs ago   Shortness of breath dyspnea    with exertion    Surgical History: Past Surgical History:  Procedure Laterality Date   Bladder tack     COLONOSCOPY     COLONOSCOPY WITH PROPOFOL N/A 03/25/2018   Procedure: COLONOSCOPY WITH PROPOFOL;  Surgeon: Toledo, [name], MD;  Location: [facility] ENDOSCOPY;  Service: Gastroenterology;  Laterality: N/A;   COLONOSCOPY WITH PROPOFOL N/A 05/13/2018   Procedure: COLONOSCOPY WITH PROPOFOL;  Surgeon: Toledo, [name], MD;  Location: [facility] ENDOSCOPY;  Service: Gastroenterology;  Laterality: N/A;  CYSTOSCOPY W/ URETERAL STENT PLACEMENT  03/11/15   CYSTOSCOPY WITH RETROGRADE PYELOGRAM, URETEROSCOPY AND STENT PLACEMENT Left 03/17/2015   Procedure: CYSTOSCOPY, STENT REMOVAL, LEFT URETEROSCOPY  WITH STONE REMOVAL WITH BASKET;  Surgeon: Bjorn Pippin, MD;  Location: [facility];  Service: Urology;  Laterality: Left;   SHOULDER ARTHROSCOPY DISTAL CLAVICLE EXCISION AND OPEN ROTATOR CUFF REPAIR     TUBAL LIGATION      Home  Medications:  Allergies as of 06/02/2023       Reactions   Dilantin [phenytoin Sodium Extended] Swelling   Glipizide Other (See Comments)   Metformin Diarrhea   Phenytoin Swelling        Medication List        Accurate as of June 02, 2023  9:59 AM. If you have any questions, ask your nurse or doctor.          albuterol 108 (90 Base) MCG/ACT inhaler Commonly known as: VENTOLIN HFA Inhale 1 puff into the lungs every 6 (six) hours as needed for wheezing or shortness of breath.   atorvastatin 40 MG tablet Commonly known as: LIPITOR Take 40 mg by mouth daily.   divalproex 250 MG 24 hr tablet Commonly known as: DEPAKOTE ER Take 500-750 mg by mouth 2 (two) times daily. Take 500mg  in morning and take 750mg  at night   DSS 100 MG Caps Take by mouth.   estradiol 0.1 MG/GM vaginal cream Commonly known as: ESTRACE VAGINAL Apply 0.5mg  (pea-sized amount)  just inside the vaginal introitus with a finger-tip on Monday, Wednesday and Friday nights.   Jardiance 10 MG Tabs tablet Generic drug: empagliflozin Take 10 mg by mouth daily.   losartan 100 MG tablet Commonly known as: COZAAR Take 1 tablet by mouth daily.   Semaglutide 14 MG Tabs Take 14 mg by mouth daily.   Trospium Chloride 60 MG Cp24 Take 1 capsule (60 mg total) by mouth daily.        Allergies:  Allergies  Allergen Reactions   Dilantin [Phenytoin Sodium Extended] Swelling   Glipizide Other (See Comments)   Metformin Diarrhea   Phenytoin Swelling    Family History: Family History  Problem Relation Age of Onset   Coronary artery disease Sister    Heart disease Sister    Brain cancer Father    Emphysema Mother     Social History:  reports that she has never smoked. She has never used smokeless tobacco. She reports that she does not drink alcohol and does not use drugs.  ROS: Pertinent ROS in HPI  Physical Exam: BP 119/82 (BP Location: Left Arm, Patient Position: Sitting, Cuff Size: Normal)    Pulse (!) 112   Ht 5\' 4"  (1.626 m)   Wt 154 lb (69.9 kg)   BMI 26.43 kg/m   Constitutional:  Well nourished. Alert and oriented, No acute distress. HEENT: [place] AT, moist mucus membranes.  Trachea midline Cardiovascular: No clubbing, cyanosis, or edema. Respiratory: Normal respiratory effort, no increased work of breathing. Neurologic: Grossly intact, no focal deficits, moving all 4 extremities. Psychiatric: Normal mood and affect.    Laboratory Data: Results for orders placed or performed during the hospital encounter of 05/25/23  Urine Culture   Specimen: Urine, Clean Catch  Result Value Ref Range   Specimen Description      URINE, CLEAN CATCH Performed at [facility], [address] [place], [place] [address]    Special Requests      NONE Performed at [facility], [address] [place], [place] [address]    Culture (A)     <10,000 COLONIES/mL INSIGNIFICANT GROWTH Performed at [facility], [address], [place], [place] [address]    Report Status 05/26/2023 FINAL   Lipase, blood  Result Value Ref Range   Lipase 37 11 - 51 U/L  Comprehensive metabolic panel  Result Value Ref Range   Sodium 135 135 - 145 mmol/L   Potassium 3.8 3.5 - 5.1 mmol/L   Chloride 99 98 - 111 mmol/L   CO2 25 22 - 32 mmol/L   Glucose, Bld 210 (H) 70 - 99 mg/dL   BUN 15 8 - 23 mg/dL   Creatinine, Ser 0.98 0.44 - 1.00 mg/dL   Calcium 8.8 (L) 8.9 - 10.3 mg/dL   Total Protein 7.3 6.5 - 8.1 g/dL   Albumin 3.6 3.5 - 5.0 g/dL   AST 19 15 - 41 U/L   ALT 14 0 - 44 U/L   Alkaline Phosphatase 53 38 - 126 U/L   Total Bilirubin 0.5 0.3 - 1.2 mg/dL   GFR, Estimated >11 >91 mL/min   Anion gap 11 5 - 15  CBC  Result Value Ref Range   WBC 5.5 4.0 - 10.5 K/uL   RBC 4.75 3.87 - 5.11 MIL/uL   Hemoglobin 14.7 12.0 - 15.0 g/dL   HCT 47.8 29.5 - 62.1 %   MCV 93.5 80.0 - 100.0 fL   MCH 30.9 26.0 - 34.0 pg   MCHC 33.1 30.0 - 36.0 g/dL   RDW 30.8 65.7 - 84.6 %   Platelets 167  150 - 400 K/uL   nRBC 0.0 0.0 - 0.2 %  Urinalysis, Routine w reflex microscopic -Urine, Clean Catch  Result Value Ref Range   Color, Urine YELLOW (A) YELLOW   APPearance CLOUDY (A) CLEAR   Specific Gravity, Urine 1.030 1.005 - 1.030   pH 5.0 5.0 - 8.0   Glucose, UA >=500 (A) NEGATIVE mg/dL   Hgb urine dipstick LARGE (A) NEGATIVE   Bilirubin Urine NEGATIVE NEGATIVE   Ketones, ur 5 (A) NEGATIVE mg/dL   Protein, ur 30 (A) NEGATIVE mg/dL   Nitrite NEGATIVE NEGATIVE   Leukocytes,Ua LARGE (A) NEGATIVE   RBC / HPF >50 0 - 5 RBC/hpf   WBC, UA >50 0 - 5 WBC/hpf   Bacteria, UA FEW (A) NONE SEEN   Squamous Epithelial / HPF 11-20 0 - 5 /HPF   WBC Clumps PRESENT    Mucus PRESENT    I have reviewed the labs.   Pertinent Imaging:  CLINICAL DATA:  Left flank pain, history of nephrolithiasis   EXAM: CT ABDOMEN AND PELVIS WITHOUT CONTRAST   TECHNIQUE: Multidetector CT imaging of the abdomen and pelvis was performed following the standard protocol without IV contrast.   RADIATION DOSE REDUCTION: This exam was performed according to the departmental dose-optimization program which includes automated exposure control, adjustment of the mA and/or kV according to patient size and/or use of iterative reconstruction technique.   COMPARISON:  03/21/2022 CT abdomen/pelvis   FINDINGS: Lower chest: No significant pulmonary nodules or acute consolidative airspace disease. Coronary atherosclerosis.   Hepatobiliary: Normal liver size. No liver mass. Normal gallbladder with no radiopaque cholelithiasis. No biliary ductal dilatation.   Pancreas: Normal, with no mass or duct dilation.   Spleen: Normal size. No mass.   Adrenals/Urinary Tract: Normal adrenals. Obstructing 4 mm left pelvic ureteral stone located approximately 2 cm above the left ureterovesical junction, with mild left hydroureteronephrosis. Several nonobstructing stones  scattered in the kidneys bilaterally, largest 3 mm in the upper  right kidney and 5 mm in the upper left kidney. No right hydronephrosis. Normal caliber right ureter. No additional ureteral stones. Numerous chronic large benign parapelvic renal cysts in both kidneys, largest 7.1 cm in the upper left kidney, not appreciably changed, for which no follow-up imaging is recommended. No new contour deforming renal masses. Normal bladder.   Stomach/Bowel: Normal non-distended stomach. Normal caliber small bowel with no small bowel wall thickening. Normal appendix. Moderate diffuse colonic stool. No large bowel wall thickening, diverticulosis or significant pericolonic fat stranding.   Vascular/Lymphatic: Atherosclerotic nonaneurysmal abdominal aorta. No pathologically enlarged lymph nodes in the abdomen or pelvis.   Reproductive: Grossly normal uterus.  No adnexal mass.   Other: No pneumoperitoneum, ascites or focal fluid collection.   Musculoskeletal: No aggressive appearing focal osseous lesions. Mild thoracolumbar spondylosis.   IMPRESSION: 1. Obstructing 4 mm left pelvic ureteral stone located approximately 2 cm above the left UVJ, with mild left hydroureteronephrosis. 2. Additional nonobstructing stones in both kidneys. 3. Moderate diffuse colonic stool, suggesting constipation. 4. Coronary atherosclerosis. 5.  Aortic Atherosclerosis (ICD10-I70.0).     Electronically Signed   By: Delbert Phenix M.D.   On: 05/25/2023 12:10  I have independently reviewed the films.  See HPI.   Assessment & Plan:    1. Nephrolithiasis -left ureteral stone w/ mild hydro on CT -KUB difficult to evaluate for stone due to constipation -There is a high probability that she did pass the stone a week ago, so I will have her return on July 3 for follow-up KUB after she has had the time to clear her intestines of more stool -Reviewed return precautions   2. High risk hematuria -likely secondary to nephrolithiasis -several RUS and non-contrast CT - positive for  stone -cysto 2016 NED -reports of gross heme -UA w/ micro heme -will need to recheck UA upon treatment of stone to ensure the micro heme resolves with the treatment of the stones -if micro heme persists, will need to repeat hematuria work up  3.  Constipation -Advised her to take the stool softener twice daily -add Flax Seed   Return for KUB 07/03.  These notes generated with voice recognition software. I apologize for typographical errors.  Cloretta Ned  [facility] [address]  [address] [place], [place] [address] [phone]

## 2023-06-02 ENCOUNTER — Ambulatory Visit
Admission: RE | Admit: 2023-06-02 | Discharge: 2023-06-02 | Disposition: A | Discharge status: Home / Self Care | Payer: Medicare Other | Source: Ambulatory Visit | Attending: Urology | Admitting: Urology

## 2023-06-02 ENCOUNTER — Ambulatory Visit (INDEPENDENT_AMBULATORY_CARE_PROVIDER_SITE_OTHER): Payer: Medicare Other | Admitting: Urology

## 2023-06-02 ENCOUNTER — Other Ambulatory Visit: Payer: Self-pay

## 2023-06-02 ENCOUNTER — Ambulatory Visit
Admission: RE | Admit: 2023-06-02 | Discharge: 2023-06-02 | Disposition: A | Discharge status: Home / Self Care | Payer: Medicare Other | Attending: Urology | Admitting: Urology

## 2023-06-02 ENCOUNTER — Encounter: Payer: Self-pay | Admitting: Urology

## 2023-06-02 VITALS — BP 119/82 | HR 112 | Ht 64.0 in | Wt 154.0 lb

## 2023-06-02 DIAGNOSIS — R310 Gross hematuria: Secondary | ICD-10-CM

## 2023-06-02 DIAGNOSIS — N200 Calculus of kidney: Secondary | ICD-10-CM

## 2023-06-02 DIAGNOSIS — K5909 Other constipation: Secondary | ICD-10-CM

## 2023-06-02 NOTE — Patient Instructions (Signed)
Flax seeds for constipation

## 2023-06-18 ENCOUNTER — Ambulatory Visit
Admission: RE | Admit: 2023-06-18 | Discharge: 2023-06-18 | Disposition: A | Discharge status: Home / Self Care | Payer: Medicare Other | Source: Ambulatory Visit | Attending: Urology | Admitting: Urology

## 2023-06-18 ENCOUNTER — Ambulatory Visit
Admission: RE | Admit: 2023-06-18 | Discharge: 2023-06-18 | Disposition: A | Discharge status: Home / Self Care | Payer: Medicare Other | Attending: Urology | Admitting: Urology

## 2023-06-18 DIAGNOSIS — N200 Calculus of kidney: Secondary | ICD-10-CM | POA: Diagnosis present

## 2023-06-26 ENCOUNTER — Encounter: Payer: Self-pay | Admitting: Podiatry

## 2023-06-26 ENCOUNTER — Ambulatory Visit: Payer: Medicare Other | Admitting: Podiatry

## 2023-06-26 VITALS — BP 154/76 | HR 99

## 2023-06-26 DIAGNOSIS — M79676 Pain in unspecified toe(s): Secondary | ICD-10-CM | POA: Diagnosis not present

## 2023-06-26 DIAGNOSIS — M2012 Hallux valgus (acquired), left foot: Secondary | ICD-10-CM

## 2023-06-26 DIAGNOSIS — M2011 Hallux valgus (acquired), right foot: Secondary | ICD-10-CM

## 2023-06-26 DIAGNOSIS — E119 Type 2 diabetes mellitus without complications: Secondary | ICD-10-CM

## 2023-06-26 DIAGNOSIS — B351 Tinea unguium: Secondary | ICD-10-CM

## 2023-06-26 NOTE — Progress Notes (Signed)
ANNUAL DIABETIC FOOT EXAM  Subjective: Crystal Foley presents today annual diabetic foot exam.  Chief Complaint  Patient presents with   Nail Problem    "Trim and file my nails."   Patient confirms h/o diabetes.  Patient denies any h/o foot wounds.  Patient denies any numbness, tingling, burning, or pins/needle sensation in feet.  Risk factors: diabetes, HTN, COPD, CKD, hyperlipidemia, hypercholesterolemia.  Lauro Regulus, MD is patient's PCP.  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)    Hematuria, gross    Hydronephrosis    Hypercholesterolemia    Hypertension    Hypothyroidism    Nephrolithiasis    Nephrolithiasis    Seizures (HCC)    last seizure 7-8 yrs ago   Shortness of breath dyspnea    with exertion   Patient Active Problem List   Diagnosis Date Noted   UTI (urinary tract infection) 03/22/2022   Syncope 03/21/2022   Elevated lactic acid level 03/21/2022   Sepsis secondary to UTI (HCC) 03/21/2022   Aortic atherosclerosis (HCC) 12/29/2018   Community acquired pneumonia 12/24/2018   Sepsis (HCC) 12/23/2018   Health care maintenance 09/19/2015   Calculus of kidney 06/01/2015   Hypercholesterolemia without hypertriglyceridemia 09/27/2014   Pure hypercholesterolemia 09/27/2014   Adult BMI 30+ 09/25/2014   Morbid obesity (HCC) 09/25/2014   Severe obesity (BMI 35.0-35.9 with comorbidity) (HCC) 09/25/2014   Asthma 05/15/2014   Benign hypertension 05/15/2014   HLD (hyperlipidemia) 05/15/2014   Seizure (HCC) 05/15/2014   Diabetes mellitus, type 2 (HCC) 05/15/2014   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) 05/15/2014   Past Surgical History:  Procedure Laterality Date   Bladder tack     COLONOSCOPY      COLONOSCOPY WITH PROPOFOL N/A 03/25/2018   Procedure: COLONOSCOPY WITH PROPOFOL;  Surgeon: Toledo, [name], MD;  Location: [facility];  Service: Gastroenterology;  Laterality: N/A;   COLONOSCOPY WITH PROPOFOL N/A 05/13/2018   Procedure: COLONOSCOPY WITH PROPOFOL;  Surgeon: Toledo, [name], MD;  Location: [facility];  Service: Gastroenterology;  Laterality: N/A;   CYSTOSCOPY W/ URETERAL STENT PLACEMENT  03/11/15   CYSTOSCOPY WITH RETROGRADE PYELOGRAM, URETEROSCOPY AND STENT PLACEMENT Left 03/17/2015   Procedure: CYSTOSCOPY, STENT REMOVAL, LEFT URETEROSCOPY  WITH STONE REMOVAL WITH BASKET;  Surgeon: Bjorn Pippin, MD;  Location: [facility];  Service: Urology;  Laterality: Left;   SHOULDER ARTHROSCOPY DISTAL CLAVICLE EXCISION AND OPEN ROTATOR CUFF REPAIR     TUBAL LIGATION     Current Outpatient Medications on File Prior to Visit  Medication Sig Dispense Refill   albuterol (PROVENTIL HFA;VENTOLIN HFA) 108 (90 BASE) MCG/ACT inhaler Inhale 1 puff into the lungs every 6 (six) hours as needed for wheezing or shortness of breath.     atorvastatin (LIPITOR) 40 MG tablet Take 40 mg by mouth daily.     divalproex (DEPAKOTE ER) 250 MG 24 hr tablet Take 500-750 mg by mouth 2 (two) times daily. Take 500mg  in morning and take 750mg  at night     Docusate Sodium (DSS) 100 MG CAPS Take by mouth.     estradiol (ESTRACE VAGINAL) 0.1 MG/GM vaginal cream Apply 0.5mg  (pea-sized amount)  just inside the vaginal introitus with a finger-tip on Monday, Wednesday and Friday nights. 30 g 12  JARDIANCE 10 MG TABS tablet Take 10 mg by mouth daily.     losartan (COZAAR) 100 MG tablet Take 1 tablet by mouth daily.     Semaglutide 14 MG TABS Take 14 mg by mouth daily.     Trospium Chloride 60 MG CP24 Take 1 capsule (60 mg total) by mouth daily. 90 capsule 3   No current facility-administered medications on file prior to visit.    Allergies  Allergen Reactions   Dilantin [Phenytoin Sodium Extended] Swelling   Glipizide  Other (See Comments)   Metformin Diarrhea   Phenytoin Swelling   Social History   Occupational History   Not on file  Tobacco Use   Smoking status: Never   Smokeless tobacco: Never  Vaping Use   Vaping status: Never Used  Substance and Sexual Activity   Alcohol use: No   Drug use: Never   Sexual activity: Not Currently    Comment: Married    Family History  Problem Relation Age of Onset   Coronary artery disease Sister    Heart disease Sister    Brain cancer Father    Emphysema Mother    Immunization History  Administered Date(s) Administered   Influenza Inj Mdck Quad Pf 10/07/2017   Influenza Split 09/19/2015   Influenza, High Dose Seasonal PF 08/28/2018, 08/20/2019, 09/25/2020   Influenza, Seasonal, Injecte, Preservative Fre 09/08/2014   Influenza-Unspecified 09/24/2010, 10/07/2017, 09/27/2021, 09/16/2022   Moderna Sars-Covid-2 Vaccination 12/31/2019, 01/28/2020, 10/31/2020   PFIZER Comirnaty(Gray Top)Covid-19 Tri-Sucrose Vaccine 09/16/2022   Pneumococcal Conjugate-13 02/12/2017   Pneumococcal Polysaccharide-23 09/19/2015   Tdap 02/12/2012   Zoster Recombinant(Shingrix) 02/25/2018, 08/28/2018     Review of Systems: Negative except as noted in the HPI.   Objective: Vitals:   06/26/23 0827  BP: (!) 154/76  Pulse: 99   Orean E Bran is a pleasant 72 y.o. female in NAD. AAO X 3. Vascular Examination: Capillary refill time immediate b/l. Vascular status intact b/l with palpable pedal pulses. Pedal hair sparse b/l. No pain with calf compression b/l. Skin temperature gradient WNL b/l. No cyanosis or clubbing b/l. No ischemia or gangrene noted b/l.   Neurological Examination: Sensation grossly intact b/l with 10 gram monofilament. Vibratory sensation intact b/l.   Dermatological Examination: Pedal skin with normal turgor, texture and tone b/l.  No open wounds. No interdigital macerations.   Toenails 1-5 b/l thick, discolored, elongated with subungual debris and  pain on dorsal palpation.   No hyperkeratotic nor porokeratotic lesions present on today's visit.  Musculoskeletal Examination: Muscle strength 5/5 to all lower extremity muscle groups bilaterally. HAV with bunion deformity noted b/l LE.  Radiographs: None  Lab Results  Component Value Date   HGBA1C 8.5 (H) 03/22/2022   ADA Risk Categorization: Low Risk :  Patient has all of the following: Intact protective sensation No prior foot ulcer  No severe deformity Pedal pulses present  Assessment: 1. Pain due to onychomycosis of nail   2. Hallux valgus, acquired, bilateral   3. Diabetes mellitus without complication (HCC)   4. Encounter for diabetic foot exam [facility])     Plan: -Patient was evaluated and treated. All patient's and/or POA's questions/concerns answered on today's visit. -Diabetic foot examination performed today. -Discussed and educated patient on diabetic foot care, especially with  regards to the vascular, neurological and musculoskeletal systems.  Stressed the importance of good glycemic control and the detriment of not  controlling glucose levels in relation to the foot. -Discussed diabetic foot care principles. Literature dispensed on  today. -Patient to continue soft, supportive shoe gear daily. -Toenails 1-5 b/l were debrided in length and girth with sterile nail nippers and dremel without iatrogenic bleeding.  -Patient/POA to call should there be question/concern in the interim. Return in about 4 months (around 10/27/2023).  Freddie Breech, DPM

## 2023-08-04 ENCOUNTER — Telehealth: Payer: Self-pay | Admitting: Urology

## 2023-08-04 NOTE — Telephone Encounter (Signed)
I had sent her a MyChart message in July, but she didn't see it.  Mrs. Sokolowski, your Xray did not show any stones.  This could be the result of the stool and gas in your intestines.  I recommend that we see you again in about three months.  We can check your urine again to see if the blood is still present.  Please call the office to schedule. Regards, Lukisha Procida, PA-C.  Would you call her and get that scheduled?

## 2023-08-08 NOTE — Telephone Encounter (Signed)
Spoke with patient and advised results, pt declined to schedule due to her now having any blood or pain, if anything changes she will give [place] a call.

## 2023-09-03 ENCOUNTER — Other Ambulatory Visit: Payer: Self-pay | Admitting: Internal Medicine

## 2023-09-03 DIAGNOSIS — Z1231 Encounter for screening mammogram for malignant neoplasm of breast: Secondary | ICD-10-CM

## 2023-10-30 ENCOUNTER — Encounter: Payer: Self-pay | Admitting: Podiatry

## 2023-10-30 ENCOUNTER — Ambulatory Visit (INDEPENDENT_AMBULATORY_CARE_PROVIDER_SITE_OTHER): Payer: Medicare Other | Admitting: Podiatry

## 2023-10-30 VITALS — Ht 64.0 in | Wt 154.0 lb

## 2023-10-30 DIAGNOSIS — B351 Tinea unguium: Secondary | ICD-10-CM

## 2023-10-30 DIAGNOSIS — E119 Type 2 diabetes mellitus without complications: Secondary | ICD-10-CM | POA: Diagnosis not present

## 2023-10-30 DIAGNOSIS — M79609 Pain in unspecified limb: Secondary | ICD-10-CM

## 2023-10-30 NOTE — Progress Notes (Signed)
  Subjective:  Patient ID: Crystal Foley, female    DOB: 09/06/1951,  MRN: [id number]  72 y.o. female presents preventative diabetic foot care and painful elongated mycotic toenails 1-5 bilaterally which are tender when wearing enclosed shoe gear. Pain is relieved with periodic professional debridement.  Chief Complaint  Patient presents with   Nail Problem    Pt is here for [facility], last A1C was 5, PCP is Dr Dareen Piano and LOV was is September.    New problem(s): None   PCP is Lauro Regulus, MD , and last visit was September, 2024.  Allergies  Allergen Reactions   Dilantin [Phenytoin Sodium Extended] Swelling   Glipizide Other (See Comments)   Metformin Diarrhea   Phenytoin Swelling    Review of Systems: Negative except as noted in the HPI.   Objective:  Crystal Foley is a pleasant 72 y.o. female WD, WN in NAD. AAO x 3.  Vascular Examination: Vascular status intact b/l with palpable pedal pulses. CFT immediate b/l. Pedal hair present. No edema. No pain with calf compression b/l. Skin temperature gradient WNL b/l. No varicosities noted. No cyanosis or clubbing noted.  Neurological Examination: Sensation grossly intact b/l with 10 gram monofilament. Vibratory sensation intact b/l.  Dermatological Examination: Pedal skin with normal turgor, texture and tone b/l. No open wounds nor interdigital macerations noted. Toenails 1-5 b/l thick, discolored, elongated with subungual debris and pain on dorsal palpation. No hyperkeratotic lesions noted b/l.   Musculoskeletal Examination: Muscle strength 5/5 to b/l LE.  No pain, crepitus noted b/l. HAV with bunion deformity noted b/l LE.  Radiographs: None  Last A1c:       No data to display           Assessment:   1. Pain due to onychomycosis of nail   2. Diabetes mellitus without complication (HCC)    Plan:  Patient was evaluated and treated. All patient's and/or POA's questions/concerns addressed on today's visit. Mycotic  toenails 1-5 debrided in length and girth without incident. Continue soft, supportive shoe gear daily. Report any pedal injuries to medical professional. Call office if there are any quesitons/concerns. -Continue foot and shoe inspections daily. Monitor blood glucose per PCP/Endocrinologist's recommendations. -Patient/POA to call should there be question/concern in the interim.  Return in about 3 months (around 01/30/2024).  Freddie Breech, DPM

## 2023-11-03 ENCOUNTER — Other Ambulatory Visit: Payer: Self-pay | Admitting: Urology

## 2023-11-03 DIAGNOSIS — N3946 Mixed incontinence: Secondary | ICD-10-CM

## 2023-11-10 ENCOUNTER — Emergency Department: Payer: Medicare Other

## 2023-11-10 ENCOUNTER — Emergency Department
Admission: EM | Admit: 2023-11-10 | Discharge: 2023-11-10 | Disposition: A | Discharge status: Home / Self Care | Payer: Medicare Other | Attending: Emergency Medicine | Admitting: Emergency Medicine

## 2023-11-10 DIAGNOSIS — Z20822 Contact with and (suspected) exposure to covid-19: Secondary | ICD-10-CM | POA: Diagnosis not present

## 2023-11-10 DIAGNOSIS — R0602 Shortness of breath: Secondary | ICD-10-CM | POA: Insufficient documentation

## 2023-11-10 DIAGNOSIS — R000 Tachycardia, unspecified: Secondary | ICD-10-CM | POA: Insufficient documentation

## 2023-11-10 DIAGNOSIS — R251 Tremor, unspecified: Secondary | ICD-10-CM | POA: Insufficient documentation

## 2023-11-10 LAB — BASIC METABOLIC PANEL
Anion gap: 13 (ref 5–15)
BUN: 11 mg/dL (ref 8–23)
CO2: 19 mmol/L — ABNORMAL LOW (ref 22–32)
Calcium: 8.4 mg/dL — ABNORMAL LOW (ref 8.9–10.3)
Chloride: 103 mmol/L (ref 98–111)
Creatinine, Ser: 0.71 mg/dL (ref 0.44–1.00)
GFR, Estimated: >60 mL/min (ref >60)
Glucose, Bld: 210 mg/dL — ABNORMAL HIGH (ref 70–99)
Potassium: 2.9 mmol/L — ABNORMAL LOW (ref 3.5–5.1)
Sodium: 135 mmol/L (ref 135–145)

## 2023-11-10 LAB — CBC WITH DIFFERENTIAL/PLATELET
Abs Immature Granulocytes: 0.02 10*3/uL (ref 0.00–0.07)
Basophils Absolute: 0 10*3/uL (ref 0.0–0.1)
Basophils Relative: 1 %
Eosinophils Absolute: 0 10*3/uL (ref 0.0–0.5)
Eosinophils Relative: 0 %
HCT: 41.4 % (ref 36.0–46.0)
Hemoglobin: 14.3 g/dL (ref 12.0–15.0)
Immature Granulocytes: 0 %
Lymphocytes Relative: 30 %
Lymphs Abs: 1.7 10*3/uL (ref 0.7–4.0)
MCH: 31.3 pg (ref 26.0–34.0)
MCHC: 34.5 g/dL (ref 30.0–36.0)
MCV: 90.6 fL (ref 80.0–100.0)
Monocytes Absolute: 0.6 10*3/uL (ref 0.1–1.0)
Monocytes Relative: 10 %
Neutro Abs: 3.4 10*3/uL (ref 1.7–7.7)
Neutrophils Relative %: 59 %
Platelets: 159 10*3/uL (ref 150–400)
RBC: 4.57 MIL/uL (ref 3.87–5.11)
RDW: 12.6 % (ref 11.5–15.5)
WBC: 5.7 10*3/uL (ref 4.0–10.5)
nRBC: 0 % (ref 0.0–0.2)

## 2023-11-10 LAB — RESP PANEL BY RT-PCR (RSV, FLU A&B, COVID)  RVPGX2
Influenza A by PCR: NEGATIVE
Influenza B by PCR: NEGATIVE
Resp Syncytial Virus by PCR: NEGATIVE
SARS Coronavirus 2 by RT PCR: NEGATIVE

## 2023-11-10 LAB — TROPONIN I (HIGH SENSITIVITY)
Troponin I (High Sensitivity): 11 ng/L (ref <18)
Troponin I (High Sensitivity): 21 ng/L — ABNORMAL HIGH (ref <18)

## 2023-11-10 MED ORDER — POTASSIUM CHLORIDE 20 MEQ PO PACK: 40.0000 meq | PACK | Freq: Two times a day (BID) | ORAL | Status: DC

## 2023-11-10 MED ORDER — POTASSIUM CHLORIDE 20 MEQ PO PACK
40.0000 meq | PACK | Freq: Two times a day (BID) | ORAL | Status: DC
  Administered 2023-11-10: 40 meq via ORAL
  Filled 2023-11-10: qty 2

## 2023-11-10 MED ORDER — IOHEXOL 350 MG/ML SOLN
75.0000 mL | Freq: Once | INTRAVENOUS | Status: AC | PRN
  Administered 2023-11-10: 75 mL via INTRAVENOUS

## 2023-11-10 NOTE — ED Triage Notes (Signed)
Pt arrived from home via [facility] c/o sob that began this evening. Upon ems arival v/s b/p 200/111 p 125 r 55 o2 100. Pt is a & o x 4

## 2023-11-10 NOTE — Discharge Instructions (Signed)
Please seek medical attention for any high fevers, chest pain, shortness of breath, change in behavior, persistent vomiting, bloody stool or any other new or concerning symptoms.  

## 2023-11-10 NOTE — ED Provider Notes (Signed)
[facility] Provider Note    Event Date/Time   First MD Initiated Contact with Patient 11/10/23 0203     (approximate)   History   Shortness of Breath   HPI  Crystal Foley is a 72 y.o. female who presents to the emergency department today via EMS because of concerns for shortness of breath and tremors.  Patient states that when she went to bed last night she was in her normal state of health.  Denies any unusual activity or exertion prior to going to bed.  However in the middle the night she woke up short of breath.  She also started having tremors.  Patient denies any chest pain.  Denies any recent fevers.     Physical Exam   Triage Vital Signs: ED Triage Vitals  Encounter Vitals Group     BP 11/10/23 0200 (!) 147/94     Systolic BP Percentile --      Diastolic BP Percentile --      Pulse Rate 11/10/23 0200 (!) 112     Resp 11/10/23 0200 19     Temp 11/10/23 0200 98.1 F (36.7 C)     Temp Source 11/10/23 0200 Oral     SpO2 11/10/23 0200 100 %     Weight 11/10/23 0203 154 lb (69.9 kg)     Height 11/10/23 0203 5\' 4"  (1.626 m)     Head Circumference --      Peak Flow --      Pain Score 11/10/23 0202 0     Pain Loc --      Pain Education --      Exclude from Growth Chart --     Most recent vital signs: Vitals:   11/10/23 0200 11/10/23 0202  BP: (!) 147/94   Pulse: (!) 112   Resp: 19   Temp: 98.1 F (36.7 C)   SpO2: 100% 100%   General: Awake, alert, oriented. CV:  Good peripheral perfusion. Tachycardia. Resp:  Normal effort. Lungs clear. Abd:  No distention.    ED Results / Procedures / Treatments   Labs (all labs ordered are listed, but only abnormal results are displayed) Labs Reviewed  BASIC METABOLIC PANEL - Abnormal; Notable for the following components:      Result Value   Potassium 2.9 (*)    CO2 19 (*)    Glucose, Bld 210 (*)    Calcium 8.4 (*)    All other components within normal limits  RESP PANEL BY RT-PCR  (RSV, FLU A&B, COVID)  [id number]  CBC WITH DIFFERENTIAL/PLATELET  TROPONIN I (HIGH SENSITIVITY)     EKG  I, Phineas Semen, attending physician, personally viewed and interpreted this EKG  EKG Time: 0218 Rate: 103 Rhythm: sinus tachycardia Axis: normal Intervals: qtc 464 QRS: narrow ST changes: no st elevation Impression: abnormal ekg    RADIOLOGY I independently interpreted and visualized the CXR. My interpretation: No pneumonia Radiology interpretation:  IMPRESSION:  1. Pulmonary hypoinflation.   I independently interpreted and visualized the CT angio PE. My interpretation: No large PE Radiology interpretation:  IMPRESSION:  1. Negative for acute pulmonary embolism.  2. Mild bronchial wall thickening which can be seen with bronchitis.  3. Small hiatal hernia node  4. Bilateral nonobstructing nephrolithiasis.    Aortic Atherosclerosis (ICD10-I70.0).     PROCEDURES:  Critical Care performed: No   MEDICATIONS ORDERED IN ED: Medications - No data to display   IMPRESSION / MDM / ASSESSMENT AND PLAN /  ED COURSE  I reviewed the triage vital signs and the nursing notes.                              Differential diagnosis includes, but is not limited to, pneumonia, PE, ACS, pneumonia  Patient's presentation is most consistent with acute presentation with potential threat to life or bodily function.   The patient is on the cardiac monitor to evaluate for evidence of arrhythmia and/or significant heart rate changes.  Patient presented to the emergency department today because of concern for an episode of shortness of breath and trembling. On initial exam lungs were clear, patient was slightly tachycardic. CXR without concerning abnormality. Given SOB and tachycardia a CT angio was obtained which did not show evidence of PE or any other concerning abnormality. The patient initial troponin was negative. Repeat slightly elevated. I discussed this with the patient. I  think likely slight elevation due to tachycardia. Patient without any chest pain to suggest acs and per chart review she has had similar elevation in tachycardia as well. Additionally patient states she has had similar episodes in the past. Think it is reasonable for patient to be discharged.      FINAL CLINICAL IMPRESSION(S) / ED DIAGNOSES   Final diagnoses:  Shortness of breath    Note:  This document was prepared using Dragon voice recognition software and may include unintentional dictation errors.    Phineas Semen, MD 11/10/23 [phone]

## 2023-11-11 ENCOUNTER — Ambulatory Visit
Admission: RE | Admit: 2023-11-11 | Discharge: 2023-11-11 | Disposition: A | Discharge status: Home / Self Care | Payer: Medicare Other | Source: Ambulatory Visit | Attending: Internal Medicine | Admitting: Internal Medicine

## 2023-11-11 DIAGNOSIS — Z1231 Encounter for screening mammogram for malignant neoplasm of breast: Secondary | ICD-10-CM | POA: Insufficient documentation

## 2023-11-19 ENCOUNTER — Other Ambulatory Visit
Admission: RE | Admit: 2023-11-19 | Discharge: 2023-11-19 | Disposition: A | Discharge status: Home / Self Care | Payer: Medicare Other | Source: Ambulatory Visit | Attending: Internal Medicine | Admitting: Internal Medicine

## 2023-11-19 DIAGNOSIS — R0602 Shortness of breath: Secondary | ICD-10-CM | POA: Diagnosis present

## 2023-11-19 LAB — TROPONIN I (HIGH SENSITIVITY): Troponin I (High Sensitivity): 4 ng/L (ref <18)

## 2024-02-02 ENCOUNTER — Encounter: Payer: Self-pay | Admitting: Podiatry

## 2024-02-02 ENCOUNTER — Ambulatory Visit: Payer: Medicare Other | Admitting: Podiatry

## 2024-02-02 VITALS — Ht 64.0 in | Wt 154.0 lb

## 2024-02-02 DIAGNOSIS — M79609 Pain in unspecified limb: Secondary | ICD-10-CM | POA: Diagnosis not present

## 2024-02-02 DIAGNOSIS — B351 Tinea unguium: Secondary | ICD-10-CM

## 2024-02-02 DIAGNOSIS — E119 Type 2 diabetes mellitus without complications: Secondary | ICD-10-CM | POA: Diagnosis not present

## 2024-02-02 NOTE — Progress Notes (Signed)
  Subjective:  Patient ID: Crystal Foley, female    DOB: August 23, 1951,  MRN: [id number]  73 y.o. female presents preventative diabetic foot care and painful, elongated thickened toenails x 10 which are symptomatic when wearing enclosed shoe gear. This interferes with his/her daily activities.  No chief complaint on file.  New problem(s): None   PCP is Lauro Regulus, MD , and last visit was November 19, 2023.  Allergies  Allergen Reactions   Dilantin [Phenytoin Sodium Extended] Swelling   Glipizide Other (See Comments)   Metformin Diarrhea   Phenytoin Swelling    Review of Systems: Negative except as noted in the HPI.   Objective:  Crystal Foley is a pleasant 73 y.o. female WD, WN in NAD. AAO x 3.  Vascular Examination: Vascular status intact b/l with palpable pedal pulses. CFT immediate b/l. Pedal hair present. No edema. No pain with calf compression b/l. Skin temperature gradient WNL b/l. No varicosities noted. No cyanosis or clubbing noted.  Neurological Examination: Sensation grossly intact b/l with 10 gram monofilament. Vibratory sensation intact b/l.  Dermatological Examination: Pedal skin with normal turgor, texture and tone b/l. No open wounds nor interdigital macerations noted. Toenails 1-5 b/l thick, discolored, elongated with subungual debris and pain on dorsal palpation. No hyperkeratotic lesions noted b/l.   Musculoskeletal Examination: Muscle strength 5/5 to b/l LE.  No pain, crepitus noted b/l. No gross pedal deformities. Patient ambulates independently without assistive aids.   Radiographs: None  Assessment:   1. Pain due to onychomycosis of nail   2. Diabetes mellitus without complication (HCC)     Plan:  Patient was evaluated and treated. All patient's and/or POA's questions/concerns addressed on today's visit. Toenails 1-5 debrided in length and girth without incident. Continue foot and shoe inspections daily. Monitor blood glucose per  PCP/Endocrinologist's recommendations. Continue soft, supportive shoe gear daily. Report any pedal injuries to medical professional. Call office if there are any questions/concerns. -Patient/POA to call should there be question/concern in the interim.  Return in about 3 months (around 05/01/2024).  Freddie Breech, DPM      [place] LOCATION: [address] [place] [address]                   Office [phone]   [facility] LOCATION: [address] [place], [place] [address] Office [phone]

## 2024-02-08 ENCOUNTER — Encounter: Payer: Self-pay | Admitting: Podiatry

## 2024-02-13 ENCOUNTER — Encounter: Payer: Self-pay | Admitting: Internal Medicine

## 2024-02-18 ENCOUNTER — Encounter: Payer: Self-pay | Admitting: Internal Medicine

## 2024-02-25 ENCOUNTER — Ambulatory Visit: Admitting: Certified Registered"

## 2024-02-25 ENCOUNTER — Encounter: Payer: Self-pay | Admitting: Internal Medicine

## 2024-02-25 ENCOUNTER — Other Ambulatory Visit: Payer: Self-pay

## 2024-02-25 ENCOUNTER — Ambulatory Visit
Admission: RE | Admit: 2024-02-25 | Discharge: 2024-02-25 | Disposition: A | Discharge status: Home / Self Care | Payer: Medicare Other | Attending: Internal Medicine | Admitting: Internal Medicine

## 2024-02-25 ENCOUNTER — Encounter
Admission: RE | Disposition: A | Discharge status: Home / Self Care | Payer: Self-pay | Source: Home / Self Care | Attending: Internal Medicine

## 2024-02-25 DIAGNOSIS — I10 Essential (primary) hypertension: Secondary | ICD-10-CM | POA: Diagnosis not present

## 2024-02-25 DIAGNOSIS — K640 First degree hemorrhoids: Secondary | ICD-10-CM | POA: Insufficient documentation

## 2024-02-25 DIAGNOSIS — Z79899 Other long term (current) drug therapy: Secondary | ICD-10-CM | POA: Insufficient documentation

## 2024-02-25 DIAGNOSIS — D125 Benign neoplasm of sigmoid colon: Secondary | ICD-10-CM | POA: Insufficient documentation

## 2024-02-25 DIAGNOSIS — Z1211 Encounter for screening for malignant neoplasm of colon: Secondary | ICD-10-CM | POA: Diagnosis present

## 2024-02-25 DIAGNOSIS — D120 Benign neoplasm of cecum: Secondary | ICD-10-CM | POA: Insufficient documentation

## 2024-02-25 DIAGNOSIS — G40909 Epilepsy, unspecified, not intractable, without status epilepticus: Secondary | ICD-10-CM | POA: Diagnosis not present

## 2024-02-25 DIAGNOSIS — E119 Type 2 diabetes mellitus without complications: Secondary | ICD-10-CM | POA: Diagnosis not present

## 2024-02-25 DIAGNOSIS — K5730 Diverticulosis of large intestine without perforation or abscess without bleeding: Secondary | ICD-10-CM | POA: Insufficient documentation

## 2024-02-25 DIAGNOSIS — D123 Benign neoplasm of transverse colon: Secondary | ICD-10-CM | POA: Insufficient documentation

## 2024-02-25 HISTORY — PX: COLONOSCOPY WITH PROPOFOL: SHX5780

## 2024-02-25 HISTORY — PX: POLYPECTOMY: SHX5525

## 2024-02-25 LAB — GLUCOSE, CAPILLARY: Glucose-Capillary: 141 mg/dL — ABNORMAL HIGH (ref 70–99)

## 2024-02-25 SURGERY — COLONOSCOPY WITH PROPOFOL
Anesthesia: General

## 2024-02-25 MED ORDER — PROPOFOL 500 MG/50ML IV EMUL
INTRAVENOUS | Status: DC | PRN
  Administered 2024-02-25: 165 ug/kg/min via INTRAVENOUS

## 2024-02-25 MED ORDER — PROPOFOL 10 MG/ML IV BOLUS
INTRAVENOUS | Status: DC | PRN
  Administered 2024-02-25: 10 mg via INTRAVENOUS
  Administered 2024-02-25: 50 mg via INTRAVENOUS

## 2024-02-25 MED ORDER — PHENYLEPHRINE 80 MCG/ML (10ML) SYRINGE FOR IV PUSH (FOR BLOOD PRESSURE SUPPORT)
PREFILLED_SYRINGE | INTRAVENOUS | Status: DC | PRN
  Administered 2024-02-25: 80 ug via INTRAVENOUS

## 2024-02-25 MED ORDER — LIDOCAINE HCL (CARDIAC) PF 100 MG/5ML IV SOSY
PREFILLED_SYRINGE | INTRAVENOUS | Status: DC | PRN
  Administered 2024-02-25: 100 mg via INTRAVENOUS

## 2024-02-25 MED ORDER — SODIUM CHLORIDE 0.9 % IV SOLN: INTRAVENOUS | Status: DC

## 2024-02-25 MED ORDER — EPHEDRINE SULFATE-NACL 50-0.9 MG/10ML-% IV SOSY
PREFILLED_SYRINGE | INTRAVENOUS | Status: DC | PRN
  Administered 2024-02-25: 5 mg via INTRAVENOUS

## 2024-02-25 MED ORDER — GLYCOPYRROLATE 0.2 MG/ML IJ SOLN
INTRAMUSCULAR | Status: DC | PRN
  Administered 2024-02-25: .2 mg via INTRAVENOUS

## 2024-02-25 MED ORDER — ESMOLOL HCL 100 MG/10ML IV SOLN
INTRAVENOUS | Status: DC | PRN
  Administered 2024-02-25: 10 mg via INTRAVENOUS

## 2024-02-25 NOTE — Transfer of Care (Signed)
 Immediate Anesthesia Transfer of Care Note  Patient: Crystal Foley  Procedure(s) Performed: COLONOSCOPY WITH PROPOFOL POLYPECTOMY  Patient Location: Endoscopy Unit  Anesthesia Type:General  Level of Consciousness: drowsy and patient cooperative  Airway & Oxygen Therapy: Patient Spontanous Breathing and Patient connected to face mask oxygen  Post-op Assessment: Report given to RN and Post -op Vital signs reviewed and stable  Post vital signs: Reviewed and stable  Last Vitals:  Vitals Value Taken Time  BP 128/73 02/25/24 1012  Temp 36.1 C 02/25/24 0952  Pulse 112 02/25/24 1015  Resp 23 02/25/24 1016  SpO2 100 % 02/25/24 1015  Vitals shown include unfiled device data.  Last Pain:  Vitals:   02/25/24 1012  TempSrc:   PainSc: 0-No pain         Complications: No notable events documented.

## 2024-02-25 NOTE — Op Note (Signed)
 [facility] Gastroenterology Patient Name: Crystal Foley Procedure Date: 02/25/2024 9:00 AM MRN: [id number] Account #: [id number] Date of Birth: March 19, 1951 Admit Type: Outpatient Age: 73 Room: [facility] ENDO ROOM 2 Gender: Female Note Status: Finalized Instrument Name: Peds Colonoscope [id number] Procedure:             Colonoscopy Indications:           Surveillance: Personal history of adenomatous polyps                         on last colonoscopy > 5 years ago Providers:             Crystal Macadamia [name] MD, MD Referring MD:          Crystal Nearing. Arriyanna Mersch MD, MD (Referring MD) Medicines:             Propofol per Anesthesia Complications:         No immediate complications. Procedure:             Pre-Anesthesia Assessment:                        - The risks and benefits of the procedure and the                         sedation options and risks were discussed with the                         patient. All questions were answered and informed                         consent was obtained.                        - Patient identification and proposed procedure were                         verified prior to the procedure by the nurse. The                         procedure was verified in the procedure room.                        - ASA Grade Assessment: III - A patient with severe                         systemic disease.                        - After reviewing the risks and benefits, the patient                         was deemed in satisfactory condition to undergo the                         procedure.                        After obtaining informed consent, the colonoscope was  passed under direct vision. Throughout the procedure,                         the patient's blood pressure, pulse, and oxygen                         saturations were monitored continuously. The                         Colonoscope was introduced through the anus and                          advanced to the the cecum, identified by appendiceal                         orifice and ileocecal valve. The colonoscopy was                         technically difficult and complex due to a redundant                         colon and significant looping. Successful completion                         of the procedure was aided by changing the patient to                         a prone position. The patient tolerated the procedure                         well. The quality of the bowel preparation was                         adequate. The ileocecal valve, appendiceal orifice,                         and rectum were photographed. Findings:      The perianal and digital rectal examinations were normal. Pertinent       negatives include normal sphincter tone and no palpable rectal lesions.      Non-bleeding internal hemorrhoids were found during retroflexion. The       hemorrhoids were Grade I (internal hemorrhoids that do not prolapse).      Four sessile and semi-pedunculated polyps were found in the sigmoid       colon, transverse colon, hepatic flexure and cecum. The polyps were 10       to 17 mm in size. These polyps were removed with a hot snare. Resection       and retrieval were complete. Estimated blood loss: none.      A few small-mouthed diverticula were found in the sigmoid colon and       ascending colon.      The exam was otherwise without abnormality. Impression:            - Non-bleeding internal hemorrhoids.                        - Four 10 to 17 mm polyps in the sigmoid colon, in the  transverse colon, at the hepatic flexure and in the                         cecum, removed with a hot snare. Resected and                         retrieved.                        - Diverticulosis in the sigmoid colon and in the                         ascending colon.                        - The examination was otherwise normal. Recommendation:        - Patient  has a contact number available for                         emergencies. The signs and symptoms of potential                         delayed complications were discussed with the patient.                         Return to normal activities tomorrow. Written                         discharge instructions were provided to the patient.                        - Resume previous diet.                        - Continue present medications.                        - Repeat colonoscopy is recommended for surveillance.                         The colonoscopy date will be determined after                         pathology results from today's exam become available                         for review.                        - Return to GI office PRN.                        - The findings and recommendations were discussed with                         the patient. Procedure Code(s):     --- Professional ---                        [phone], Colonoscopy, flexible; with removal of  tumor(s), polyp(s), or other lesion(s) by snare                         technique Diagnosis Code(s):     --- Professional ---                        K57.30, Diverticulosis of large intestine without                         perforation or abscess without bleeding                        K64.0, First degree hemorrhoids                        D12.0, Benign neoplasm of cecum                        D12.3, Benign neoplasm of transverse colon (hepatic                         flexure or splenic flexure)                        D12.5, Benign neoplasm of sigmoid colon                        Z86.010, Personal history of colonic polyps CPT copyright 2022 American [employer] Association. All rights reserved. The codes documented in this report are preliminary and upon coder review may  be revised to meet current compliance requirements. Crystal Kidney MD, MD 02/25/2024 9:53:10 AM This report has been signed  electronically. Number of Addenda: 0 Note Initiated On: 02/25/2024 9:00 AM Scope Withdrawal Time: 0 hours 11 minutes 28 seconds  Total Procedure Duration: 0 hours 27 minutes 44 seconds  Estimated Blood Loss:  Estimated blood loss: none.      [facility]

## 2024-02-25 NOTE — Anesthesia Postprocedure Evaluation (Signed)
 Anesthesia Post Note  Patient: Crystal Foley  Procedure(s) Performed: COLONOSCOPY WITH PROPOFOL POLYPECTOMY  Patient location during evaluation: Endoscopy Anesthesia Type: General Level of consciousness: awake and alert Pain management: pain level controlled Vital Signs Assessment: post-procedure vital signs reviewed and stable Respiratory status: spontaneous breathing, nonlabored ventilation, respiratory function stable and patient connected to nasal cannula oxygen Cardiovascular status: blood pressure returned to baseline and stable Postop Assessment: no apparent nausea or vomiting Anesthetic complications: no   No notable events documented.   Last Vitals:  Vitals:   02/25/24 1002 02/25/24 1012  BP: 92/66 128/73  Pulse: (!) 106 (!) 111  Resp: 17 20  Temp:    SpO2: 98% 100%    Last Pain:  Vitals:   02/25/24 1012  TempSrc:   PainSc: 0-No pain                 Corinda Gubler

## 2024-02-25 NOTE — Interval H&P Note (Signed)
 History and Physical Interval Note:  02/25/2024 9:01 AM  Crystal Foley  has presented today for surgery, with the diagnosis of Z86.0100 (ICD-10-CM) - History of colon polyps.  The various methods of treatment have been discussed with the patient and family. After consideration of risks, benefits and other options for treatment, the patient has consented to  Procedure(s): COLONOSCOPY WITH PROPOFOL (N/A) as a surgical intervention.  The patient's history has been reviewed, patient examined, no change in status, stable for surgery.  I have reviewed the patient's chart and labs.  Questions were answered to the patient's satisfaction.     [place], [place]

## 2024-02-25 NOTE — H&P (Signed)
 Outpatient short stay form Pre-procedure 02/25/2024 9:00 AM Crystal Foley K. Norma Crystal Foley, M.D.  Primary Physician: Crystal Foley, M.D.  Reason for visit:  hx of multiple adenomas with tubulovillous component  History of present illness:                            Patient presents for colonoscopy for a personal hx of colon polyps. The patient denies abdominal pain, abnormal weight loss or rectal bleeding.  Colonoscopy 04/2018-diverticulosis sigmoid. 5 small polyps 3 to 8 mm.Path w/ tubulovillous adenoma, and TAs. 3 year repeat recommended.     Current Facility-Administered Medications:    0.9 %  sodium chloride infusion, , Intravenous, Continuous, Crystal Foley, Crystal Nearing, MD  Medications Prior to Admission  Medication Sig Dispense Refill Last Dose/Taking   atorvastatin (LIPITOR) 40 MG tablet Take 40 mg by mouth daily.   02/24/2024   divalproex (DEPAKOTE ER) 250 MG 24 hr tablet Take 500-750 mg by mouth 2 (two) times daily. Take 500mg  in morning and take 750mg  at night   02/25/2024 Morning   albuterol (PROVENTIL HFA;VENTOLIN HFA) 108 (90 BASE) MCG/ACT inhaler Inhale 1 puff into the lungs every 6 (six) hours as needed for wheezing or shortness of breath.      Docusate Sodium (DSS) 100 MG CAPS Take by mouth.      estradiol (ESTRACE VAGINAL) 0.1 MG/GM vaginal cream Apply 0.5mg  (pea-sized amount)  just inside the vaginal introitus with a finger-tip on Monday, Wednesday and Friday nights. 30 g 12    JARDIANCE 10 MG TABS tablet Take 10 mg by mouth daily.   02/23/2024   losartan (COZAAR) 100 MG tablet Take 1 tablet by mouth daily.   02/23/2024   Semaglutide 14 MG TABS Take 14 mg by mouth daily.   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    Physical Exam  Gen: Alert, oriented. Appears stated age.  HEENT: /AT. PERRLA. Lungs: CTA, no wheezes. CV: RR nl S1, S2. Abd: soft, benign, no masses. BS+ Ext: No edema. Pulses 2+    Planned procedures: Proceed with colonoscopy. The patient understands the nature of the planned procedure, indications, risks, alternatives and potential complications including but not limited to bleeding, infection, perforation, damage to internal organs and possible oversedation/side effects from anesthesia. The patient agrees and gives consent to proceed.  Please refer to procedure notes for findings, recommendations and patient disposition/instructions.     Crystal Foley K. Norma Crystal Foley, M.D. Gastroenterology 02/25/2024  9:00 AM

## 2024-02-25 NOTE — Anesthesia Preprocedure Evaluation (Signed)
 Anesthesia Evaluation  Patient identified by MRN, date of birth, ID band Patient awake  General Assessment Comment:  Patient fell this morning while straining on toilet, hit her head. Not on blood thinners, no other symptoms other than bruise on head.  Reviewed: Allergy & Precautions, NPO status , Patient's Chart, lab work & pertinent test results  History of Anesthesia Complications Negative for: history of anesthetic complications  Airway Mallampati: II  TM Distance: >3 FB Neck ROM: Full    Dental no notable dental hx. (+) Teeth Intact   Pulmonary shortness of breath, neg sleep apnea, COPD, Patient abstained from smoking.Not current smoker   Pulmonary exam normal breath sounds clear to auscultation       Cardiovascular Exercise Tolerance: Good METShypertension, Pt. on medications (-) CAD and (-) Past MI (-) dysrhythmias  Rhythm:Regular Rate:Normal - Systolic murmurs    Neuro/Psych Seizures -, Well Controlled,   negative psych ROS   GI/Hepatic ,neg GERD  ,,(+)     (-) substance abuse    Endo/Other  diabetes  GLP1 use listed, but patient denies  Renal/GU CRFRenal disease     Musculoskeletal   Abdominal   Peds  Hematology   Anesthesia Other Findings Past Medical History: No date: Asthma No date: Bilateral kidney stones No date: COPD (chronic obstructive pulmonary disease) (HCC) No date: Diabetes mellitus No date: Generalized convulsive epilepsy (HCC) No date: 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) No date: Hematuria, gross No date: Hydronephrosis No date: Hypercholesterolemia No date: Hypertension No date: Hypothyroidism No date: Nephrolithiasis No date: Nephrolithiasis No date: Seizures (HCC)     Comment:  last seizure 7-8 yrs ago No date: Shortness of breath dyspnea     Comment:  with exertion  Reproductive/Obstetrics                              Anesthesia Physical Anesthesia Plan  ASA: 3  Anesthesia Plan: General   Post-op Pain Management: Minimal or no pain anticipated   Induction: Intravenous  PONV Risk Score and Plan: 3 and Propofol infusion, TIVA and Ondansetron  Airway Management Planned: Nasal Cannula  Additional Equipment: None  Intra-op Plan:   Post-operative Plan:   Informed Consent: I have reviewed the patients History and Physical, chart, labs and discussed the procedure including the risks, benefits and alternatives for the proposed anesthesia with the patient or authorized representative who has indicated his/her understanding and acceptance.     Dental advisory given  Plan Discussed with: CRNA and Surgeon  Anesthesia Plan Comments: (Discussed risks of anesthesia with patient, including possibility of difficulty with spontaneous ventilation under anesthesia necessitating airway intervention, PONV, and rare risks such as cardiac or respiratory or neurological events, and allergic reactions. Discussed the role of CRNA in patient's perioperative care. Patient understands.)       Anesthesia Quick Evaluation

## 2024-02-26 ENCOUNTER — Encounter: Payer: Self-pay | Admitting: Emergency Medicine

## 2024-02-26 ENCOUNTER — Other Ambulatory Visit: Payer: Self-pay

## 2024-02-26 ENCOUNTER — Emergency Department

## 2024-02-26 ENCOUNTER — Observation Stay
Admission: EM | Admit: 2024-02-26 | Discharge: 2024-02-27 | Disposition: A | Discharge status: Home / Self Care | Attending: Internal Medicine | Admitting: Internal Medicine

## 2024-02-26 DIAGNOSIS — E039 Hypothyroidism, unspecified: Secondary | ICD-10-CM | POA: Insufficient documentation

## 2024-02-26 DIAGNOSIS — R55 Syncope and collapse: Secondary | ICD-10-CM | POA: Diagnosis not present

## 2024-02-26 DIAGNOSIS — W01198A Fall on same level from slipping, tripping and stumbling with subsequent striking against other object, initial encounter: Secondary | ICD-10-CM | POA: Diagnosis not present

## 2024-02-26 DIAGNOSIS — E119 Type 2 diabetes mellitus without complications: Secondary | ICD-10-CM | POA: Diagnosis not present

## 2024-02-26 DIAGNOSIS — S0012XA Contusion of left eyelid and periocular area, initial encounter: Secondary | ICD-10-CM | POA: Insufficient documentation

## 2024-02-26 DIAGNOSIS — J9601 Acute respiratory failure with hypoxia: Secondary | ICD-10-CM | POA: Insufficient documentation

## 2024-02-26 DIAGNOSIS — I10 Essential (primary) hypertension: Secondary | ICD-10-CM | POA: Insufficient documentation

## 2024-02-26 DIAGNOSIS — J449 Chronic obstructive pulmonary disease, unspecified: Secondary | ICD-10-CM | POA: Insufficient documentation

## 2024-02-26 DIAGNOSIS — Z794 Long term (current) use of insulin: Secondary | ICD-10-CM | POA: Insufficient documentation

## 2024-02-26 DIAGNOSIS — Z79899 Other long term (current) drug therapy: Secondary | ICD-10-CM | POA: Insufficient documentation

## 2024-02-26 DIAGNOSIS — N200 Calculus of kidney: Secondary | ICD-10-CM | POA: Diagnosis not present

## 2024-02-26 DIAGNOSIS — R0902 Hypoxemia: Secondary | ICD-10-CM

## 2024-02-26 DIAGNOSIS — G40909 Epilepsy, unspecified, not intractable, without status epilepticus: Secondary | ICD-10-CM | POA: Diagnosis not present

## 2024-02-26 DIAGNOSIS — Z1152 Encounter for screening for COVID-19: Secondary | ICD-10-CM | POA: Insufficient documentation

## 2024-02-26 DIAGNOSIS — H05232 Hemorrhage of left orbit: Secondary | ICD-10-CM

## 2024-02-26 DIAGNOSIS — Z8679 Personal history of other diseases of the circulatory system: Secondary | ICD-10-CM | POA: Insufficient documentation

## 2024-02-26 LAB — URINALYSIS, ROUTINE W REFLEX MICROSCOPIC
Bilirubin Urine: NEGATIVE
Glucose, UA: >=500 mg/dL — AB
Hgb urine dipstick: NEGATIVE
Ketones, ur: 5 mg/dL — AB
Nitrite: NEGATIVE
Protein, ur: NEGATIVE mg/dL
Specific Gravity, Urine: 1.027 (ref 1.005–1.030)
pH: 6 (ref 5.0–8.0)

## 2024-02-26 LAB — BASIC METABOLIC PANEL
Anion gap: 12 (ref 5–15)
BUN: 8 mg/dL (ref 8–23)
CO2: 26 mmol/L (ref 22–32)
Calcium: 9.2 mg/dL (ref 8.9–10.3)
Chloride: 100 mmol/L (ref 98–111)
Creatinine, Ser: 0.65 mg/dL (ref 0.44–1.00)
GFR, Estimated: >60 mL/min (ref >60)
Glucose, Bld: 163 mg/dL — ABNORMAL HIGH (ref 70–99)
Potassium: 3 mmol/L — ABNORMAL LOW (ref 3.5–5.1)
Sodium: 138 mmol/L (ref 135–145)

## 2024-02-26 LAB — VALPROIC ACID LEVEL: Valproic Acid Lvl: 101 ug/mL — ABNORMAL HIGH (ref 50.0–100.0)

## 2024-02-26 LAB — CBC
HCT: 40 % (ref 36.0–46.0)
Hemoglobin: 13.8 g/dL (ref 12.0–15.0)
MCH: 31.6 pg (ref 26.0–34.0)
MCHC: 34.5 g/dL (ref 30.0–36.0)
MCV: 91.5 fL (ref 80.0–100.0)
Platelets: 167 10*3/uL (ref 150–400)
RBC: 4.37 MIL/uL (ref 3.87–5.11)
RDW: 13.4 % (ref 11.5–15.5)
WBC: 6.2 10*3/uL (ref 4.0–10.5)
nRBC: 0 % (ref 0.0–0.2)

## 2024-02-26 LAB — TSH: TSH: 1.549 u[IU]/mL (ref 0.350–4.500)

## 2024-02-26 LAB — SURGICAL PATHOLOGY

## 2024-02-26 LAB — TROPONIN I (HIGH SENSITIVITY)
Troponin I (High Sensitivity): 6 ng/L (ref <18)
Troponin I (High Sensitivity): 6 ng/L (ref <18)

## 2024-02-26 LAB — RESP PANEL BY RT-PCR (RSV, FLU A&B, COVID)  RVPGX2
Influenza A by PCR: NEGATIVE
Influenza B by PCR: NEGATIVE
Resp Syncytial Virus by PCR: NEGATIVE
SARS Coronavirus 2 by RT PCR: NEGATIVE

## 2024-02-26 LAB — D-DIMER, QUANTITATIVE: D-Dimer, Quant: 0.67 ug{FEU}/mL — ABNORMAL HIGH (ref 0.00–0.50)

## 2024-02-26 LAB — CBG MONITORING, ED: Glucose-Capillary: 140 mg/dL — ABNORMAL HIGH (ref 70–99)

## 2024-02-26 MED ORDER — ACETAMINOPHEN 650 MG RE SUPP: 650.0000 mg | Freq: Four times a day (QID) | RECTAL | Status: DC | PRN

## 2024-02-26 MED ORDER — ALBUTEROL SULFATE (2.5 MG/3ML) 0.083% IN NEBU: 2.5000 mg | INHALATION_SOLUTION | Freq: Four times a day (QID) | RESPIRATORY_TRACT | Status: DC | PRN

## 2024-02-26 MED ORDER — ATORVASTATIN CALCIUM 20 MG PO TABS
40.0000 mg | ORAL_TABLET | Freq: Every day | ORAL | Status: DC
  Administered 2024-02-27: 40 mg via ORAL
  Filled 2024-02-26: qty 2

## 2024-02-26 MED ORDER — ONDANSETRON HCL 4 MG/2ML IJ SOLN: 4.0000 mg | Freq: Four times a day (QID) | INTRAMUSCULAR | Status: DC | PRN

## 2024-02-26 MED ORDER — ONDANSETRON HCL 4 MG PO TABS: 4.0000 mg | ORAL_TABLET | Freq: Four times a day (QID) | ORAL | Status: DC | PRN

## 2024-02-26 MED ORDER — ACETAMINOPHEN 500 MG PO TABS
1000.0000 mg | ORAL_TABLET | Freq: Once | ORAL | Status: AC
  Administered 2024-02-26: 1000 mg via ORAL
  Filled 2024-02-26: qty 2

## 2024-02-26 MED ORDER — LIDOCAINE 5 % EX PTCH
1.0000 | MEDICATED_PATCH | Freq: Once | CUTANEOUS | Status: AC
  Administered 2024-02-26: 1 via TRANSDERMAL
  Filled 2024-02-26: qty 1

## 2024-02-26 MED ORDER — SODIUM CHLORIDE 0.9% FLUSH
3.0000 mL | Freq: Two times a day (BID) | INTRAVENOUS | Status: DC
  Administered 2024-02-26 – 2024-02-27 (×2): 3 mL via INTRAVENOUS

## 2024-02-26 MED ORDER — ACETAMINOPHEN 325 MG PO TABS: 650.0000 mg | ORAL_TABLET | Freq: Four times a day (QID) | ORAL | Status: DC | PRN

## 2024-02-26 MED ORDER — HYDROCODONE-ACETAMINOPHEN 5-325 MG PO TABS: 1.0000 | ORAL_TABLET | ORAL | Status: DC | PRN

## 2024-02-26 MED ORDER — DIVALPROEX SODIUM ER 250 MG PO TB24
750.0000 mg | ORAL_TABLET | Freq: Every day | ORAL | Status: DC
  Filled 2024-02-26: qty 3

## 2024-02-26 MED ORDER — INSULIN ASPART 100 UNIT/ML IJ SOLN: 0.0000 [IU] | Freq: Every day | INTRAMUSCULAR | Status: DC

## 2024-02-26 MED ORDER — INSULIN ASPART 100 UNIT/ML IJ SOLN
0.0000 [IU] | Freq: Three times a day (TID) | INTRAMUSCULAR | Status: DC
  Administered 2024-02-27: 3 [IU] via SUBCUTANEOUS
  Filled 2024-02-26: qty 1

## 2024-02-26 MED ORDER — DIVALPROEX SODIUM ER 250 MG PO TB24: 500.0000 mg | ORAL_TABLET | Freq: Every morning | ORAL | Status: DC

## 2024-02-26 MED ORDER — SODIUM CHLORIDE 0.9 % IV SOLN
1.0000 g | Freq: Once | INTRAVENOUS | Status: AC
  Administered 2024-02-26: 1 g via INTRAVENOUS
  Filled 2024-02-26: qty 10

## 2024-02-26 MED ORDER — TROSPIUM CHLORIDE ER 60 MG PO CP24: 60.0000 mg | ORAL_CAPSULE | Freq: Every day | ORAL | Status: DC

## 2024-02-26 MED ORDER — ENOXAPARIN SODIUM 40 MG/0.4ML IJ SOSY
40.0000 mg | PREFILLED_SYRINGE | INTRAMUSCULAR | Status: DC
  Administered 2024-02-26: 40 mg via SUBCUTANEOUS
  Filled 2024-02-26: qty 0.4

## 2024-02-26 NOTE — Assessment & Plan Note (Signed)
 Ice packs and pain management No acute internal injuries on CT head C-spine and maxillofacial and CT chest

## 2024-02-26 NOTE — ED Notes (Addendum)
 Pt up to restroom with stand by assist. Clean catch urine sample collected. Pt placed back in bed on monitor. Pt sob and tachypnea w/ exertion. Pt oxygen saturation read 85% when pt returned back to  bed but quickly returned to baseline> 95%. Pt self-administer 2 puffs of home albuterol (90 mcg). Pt reports feeling better w/ rest. Fuller Plan, MD made aware.

## 2024-02-26 NOTE — ED Notes (Signed)
 Pt up to restroom, gait steady. Pt sob on exertion. Pt back on monitor. Oxygen saturation remained above 95%. Pt reports feeling better this time she ambulated compared to before. Family at bedside.

## 2024-02-26 NOTE — ED Notes (Signed)
 Pt returned from CT at this time.

## 2024-02-26 NOTE — ED Provider Notes (Addendum)
 [facility] Provider Note    Event Date/Time   First MD Initiated Contact with Patient 02/26/24 1745     (approximate)   History   Fall and Loss of Consciousness   HPI  Crystal Foley is a 73 y.o. female who comes in from [facility] due to a syncopal episode.  Patient reports her for syncopal episode happened yesterday early morning.  Patient reports that she was prepping for her colonoscopy when she had to have a large bowel movement when she was trying to get into the tub to wash off she syncopized and hit the left side of her face.  She states that she went to go have her colonoscopy done and they did remove some polyps I told her to avoid any aspirin, ibuprofen.  She states that today she had another episode where she was sitting down and felt like her vision going blurred and her husband lowered her to the ground.  She reports pain on her left upper chest wall as well as some pain on her left forehead but she states that this has been present since the initial syncopal episode.   Physical Exam   Triage Vital Signs: ED Triage Vitals  Encounter Vitals Group     BP 02/26/24 1743 (!) 162/78     Systolic BP Percentile --      Diastolic BP Percentile --      Pulse Rate 02/26/24 1743 96     Resp 02/26/24 1743 18     Temp 02/26/24 1743 98 F (36.7 C)     Temp Source 02/26/24 1743 Oral     SpO2 02/26/24 1743 99 %     Weight 02/26/24 1741 141 lb (64 kg)     Height 02/26/24 1741 5\' 4"  (1.626 m)     Head Circumference --      Peak Flow --      Pain Score 02/26/24 1741 2     Pain Loc --      Pain Education --      Exclude from Growth Chart --     Most recent vital signs: Vitals:   02/26/24 1743  BP: (!) 162/78  Pulse: 96  Resp: 18  Temp: 98 F (36.7 C)  SpO2: 99%     General: Awake, no distress.  CV:  Good peripheral perfusion.  Left chest wall tenderness Resp:  Normal effort.  Abd:  No distention.  Soft and  nontender Other:  Bruising noted around her bilateral eyes with tenderness underneath the left orbit.  She is got an abrasion noted to her forehead.   ED Results / Procedures / Treatments   Labs (all labs ordered are listed, but only abnormal results are displayed) Labs Reviewed  BASIC METABOLIC PANEL - Abnormal; Notable for the following components:      Result Value   Potassium 3.0 (*)    Glucose, Bld 163 (*)    All other components within normal limits  CBC  URINALYSIS, ROUTINE W REFLEX MICROSCOPIC  CBG MONITORING, ED  TROPONIN I (HIGH SENSITIVITY)     EKG  My interpretation of EKG:  Sinus rate of 96 without any ST elevation or T wave versions, normal intervals  RADIOLOGY I have reviewed the CT head personally interpreted no evidence of intracranial hemorrhage   PROCEDURES:  Critical Care performed: No  Procedures   MEDICATIONS ORDERED IN ED: Medications  acetaminophen (TYLENOL) tablet 1,000 mg (has no administration in time range)  lidocaine (LIDODERM) 5 % 1 patch (has no administration in time range)     IMPRESSION / MDM / ASSESSMENT AND PLAN / ED COURSE  I reviewed the triage vital signs and the nursing notes.   Patient's presentation is most consistent with acute presentation with potential threat to life or bodily function.   Patient comes in with 2 syncopal episodes.  The for syncopal episode seem to be precipitated by her colonoscopy prep.  Unclear the cause of the second 1.  Will get blood work to evaluate for [employer] abnormalities, AKI, EKG evaluate for any arrhythmia on CT imaging to evaluate for rib fractures, intercranial hemorrhage, cervical fracture or other acute pathology  COVID, flu are negative.  Her urine with possible UTI BMP shows slightly low potassium of 3.  Initial troponin is negative  Patient reportedly got short of breath with ambulation with sats down to 85% I did get a COVID, flu test.  I am still waiting on her CT scan to come  back but suspect patient will require admission given this concern for hypoxia  Patient CT imaging was reassuring of her ribs but I am concerned with patient's syncope and new hypoxia with ambulation I do not hear any wheezing to attribute this to COPD I discussed with patient coming in for workup for PE I will add on D-dimer as well as echocardiogram.  Patient expressed understanding and felt comfortable with this plan  Patient's age-adjusted D-dimer is unlikely for VTE.  The patient is on the cardiac monitor to evaluate for evidence of arrhythmia and/or significant heart rate changes.      FINAL CLINICAL IMPRESSION(S) / ED DIAGNOSES   Final diagnoses:  Syncope, unspecified syncope type     Rx / DC Orders   ED Discharge Orders     None        Note:  This document was prepared using Dragon voice recognition software and may include unintentional dictation errors.   Concha Se, MD 02/26/24 2138    Concha Se, MD 02/26/24 2219

## 2024-02-26 NOTE — Assessment & Plan Note (Signed)
Not acutely exacerbated.  Continue albuterol as needed.

## 2024-02-26 NOTE — ED Notes (Signed)
 Blue top sent to lab.

## 2024-02-26 NOTE — Assessment & Plan Note (Signed)
 History of syncopal event 03/2022 with negative cardiac workup Etiology undetermined, possibly vasovagal Head CT unrevealing Continuous cardiac monitoring, echocardiogram, carotid Doppler, EEG Will get neurology consult given history of seizure, negative prior cardiac workup and no regular neurology follow-up Orthostatic checks Valproate level Neurologic checks, fall and aspiration precautions Will consult cardiology.  Benefit from [employer].  Husband states she is frequently Tachycardic and will check TSH

## 2024-02-26 NOTE — ED Triage Notes (Addendum)
 Pt via [facility] from [facility]. Pt had a syncopal episode yesterday morning around 0300 while on the toilet. Pt did hit the front L side of her face. Pt did have a colonoscopy done yesterday. Reports that she has been feeling bad all day today and stated that she had a second syncopal episode while sitting on the couch. Did not have a fall, states that husband lowered her to the ground. Pt c/o L sided pain, near flank area. Pt has bruising to the L eye and hematoma to the L side of her forehead but denies any pain. Denies thinners. Pt is A&Ox4 and NAD.  132 CBG  167/90 BP 104 ST

## 2024-02-26 NOTE — Assessment & Plan Note (Signed)
 Will check TSH

## 2024-02-26 NOTE — Assessment & Plan Note (Signed)
 Continue Depakote Check levels EEG ordered

## 2024-02-26 NOTE — Assessment & Plan Note (Signed)
 No acute issues.

## 2024-02-26 NOTE — Assessment & Plan Note (Signed)
 Sliding scale insulin coverage

## 2024-02-26 NOTE — ED Notes (Signed)
 ED Provider at bedside.

## 2024-02-26 NOTE — ED Notes (Signed)
 Pt report received. Pending transport to 36.

## 2024-02-26 NOTE — H&P (Signed)
 History and Physical    Patient: Crystal Foley VWU:[id number] DOB: [birth year] DOA: 02/26/2024 DOS: the patient was seen and examined on 02/26/2024 PCP: Lauro Regulus, MD  Patient coming from: Home  Chief Complaint:  Chief Complaint  Patient presents with   Fall   Loss of Consciousness    HPI: Crystal Foley is a 73 y.o. female with medical history significant for HTN, type II DM, nephrolithiasis, seizure disorder on Depakote with no recent neurology follow up ,admitted for syncope 03/2022 of undetermined etiology(possibly tachycardia, per husband at bedside) with reassuring cardiac workup 08/2022 (echo and stress test), who presents to the ED for evaluation of 2 syncopal events occurring within a day of each other.  The first episode occurred the day prior at 3 AM that followed an incontinent bowel movement during a colonoscopy prep.  According to her husband, she was sitting in the bathtub and leaned over the side and then passed out, falling and hitting her face.  She returned to baseline and was able to go to the colonoscopy .  On the day  after her colonoscopy, she had another episode sewed while sitting and her husband had to lower her to the floor.  Husband states she complained of losing her peripheral vision and several minutes later she passed out.  She had no prior nausea or vomiting and states she had been eating fine following her colonoscopy.  There was no seizure activity.  He states that her last seizure was 16 years ago and it was a generalized tonic-clonic seizure.  Patient denies associated palpitations, chest pain or shortness of breath.  Has no lower extremity pain or swelling. ED course and data review: Vitals within normal limits in the ED however she was noted to desaturate to 85% on room air with ambulation from the bathroom. Labs significant for the following: Troponin 6-/6 D-dimer 0.67, normal when age-adjusted BMP with potassium of 3.0 CBC normal Urinalysis  negative for infection Trauma imaging with CT head C-spine and maxillofacialNo evidence of intracranial trauma.  Maxillofacial shows periorbital soft tissue swelling/hematoma over left supraorbital ridge CT chest showed no acute intrathoracic traumatic injury did show bilateral nephrolithiasis EKG showed sinus 96  Patient started on empiric ceftriaxone for possible UTI Hospitalist consulted for admission.   Review of Systems: As mentioned in the history of present illness. All other systems reviewed and are negative.  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)    Hematuria, gross    Hydronephrosis    Hypercholesterolemia    Hypertension    Hypothyroidism    Nephrolithiasis    Nephrolithiasis    Seizures (HCC)    last seizure 7-8 yrs ago   Shortness of breath dyspnea    with exertion   Past Surgical History:  Procedure Laterality Date   Bladder tack     COLONOSCOPY     COLONOSCOPY WITH PROPOFOL N/A 03/25/2018   Procedure: COLONOSCOPY WITH PROPOFOL;  Surgeon: Toledo, [name], MD;  Location: [facility];  Service: Gastroenterology;  Laterality: N/A;   COLONOSCOPY WITH PROPOFOL N/A 05/13/2018   Procedure: COLONOSCOPY WITH PROPOFOL;  Surgeon: Toledo, [name], MD;  Location: [facility];  Service: Gastroenterology;  Laterality: N/A;   COLONOSCOPY WITH PROPOFOL N/A 02/25/2024   Procedure: COLONOSCOPY WITH  PROPOFOL;  Surgeon: Toledo, [name], MD;  Location: [facility];  Service: Gastroenterology;  Laterality: N/A;   CYSTOSCOPY W/ URETERAL STENT PLACEMENT  03/11/15   CYSTOSCOPY WITH RETROGRADE PYELOGRAM, URETEROSCOPY AND STENT PLACEMENT Left 03/17/2015   Procedure: CYSTOSCOPY, STENT REMOVAL, LEFT URETEROSCOPY  WITH STONE REMOVAL WITH BASKET;  Surgeon: Bjorn Pippin, MD;  Location: [facility];  Service: Urology;  Laterality: Left;   POLYPECTOMY  02/25/2024   Procedure: POLYPECTOMY;  Surgeon: Toledo, [name], MD;  Location: [facility];  Service: Gastroenterology;;   SHOULDER ARTHROSCOPY DISTAL CLAVICLE EXCISION AND OPEN ROTATOR CUFF REPAIR     TUBAL LIGATION     Social History:  reports that she has never smoked. She has never used smokeless tobacco. She reports that she does not drink alcohol and does not use drugs.  Allergies  Allergen Reactions   Dilantin [Phenytoin Sodium Extended] Swelling   Glipizide Other (See Comments)   Metformin Diarrhea   Phenytoin Swelling    Family History  Problem Relation Age of Onset   Coronary artery disease Sister    Heart disease Sister    Brain cancer Father    Emphysema Mother     Prior to Admission medications   Medication Sig Start Date End Date Taking? Authorizing Provider  albuterol (PROVENTIL HFA;VENTOLIN HFA) 108 (90 BASE) MCG/ACT inhaler Inhale 1 puff into the lungs every 6 (six) hours as needed for wheezing or shortness of breath.    [provider]  atorvastatin (LIPITOR) 40 MG tablet Take 40 mg by mouth daily.    [provider]  divalproex (DEPAKOTE ER) 250 MG 24 hr tablet Take 500-750 mg by mouth 2 (two) times daily. Take 500mg  in morning and take 750mg  at night    [provider]  Docusate Sodium (DSS) 100 MG CAPS Take by mouth.    [provider]  estradiol (ESTRACE VAGINAL) 0.1 MG/GM vaginal cream Apply 0.5mg  (pea-sized amount)  just inside the vaginal introitus with a finger-tip on Monday, Wednesday and Friday nights. 03/20/22   McGowan, Carollee Herter A, PA-C  JARDIANCE 10 MG TABS tablet Take 10 mg by mouth daily.    [provider]  losartan (COZAAR) 100 MG tablet Take 1 tablet by mouth daily. 07/04/21   [provider]  Semaglutide 14 MG TABS Take 14 mg by mouth daily. 04/21/23   [provider]  Trospium Chloride 60 MG CP24 TAKE 1  CAPSULE BY MOUTH EVERY DAY 11/04/23   Harle Battiest, PA-C    Physical Exam: Vitals:   02/26/24 1741 02/26/24 1743 02/26/24 2000 02/26/24 2049  BP:  (!) 162/78 126/66 129/73  Pulse:  96 91 88  Resp:  18 10 17   Temp:  98 F (36.7 C)  98.5 F (36.9 C)  TempSrc:  Oral  Oral  SpO2:  99% 96% 100%  Weight: 64 kg     Height: 5\' 4"  (1.626 m)      Physical Exam Vitals and nursing note reviewed.  Constitutional:      General: She is not in acute distress. HENT:     Head: Normocephalic.     Comments: Left supraorbital ecchymosis and hematoma Cardiovascular:     Rate and Rhythm: Normal rate and regular rhythm.     Heart sounds: Normal heart sounds.  Pulmonary:     Effort: Pulmonary effort is normal.     Breath sounds: Normal breath sounds.  Abdominal:     Palpations: Abdomen is soft.     Tenderness:  There is no abdominal tenderness.  Neurological:     Mental Status: Mental status is at baseline.     Labs on Admission: I have personally reviewed following labs and imaging studies  CBC: Recent Labs  Lab 02/26/24 1741  WBC 6.2  HGB 13.8  HCT 40.0  MCV 91.5  PLT 167   Basic Metabolic Panel: Recent Labs  Lab 02/26/24 1741  NA 138  K 3.0*  CL 100  CO2 26  GLUCOSE 163*  BUN 8  CREATININE 0.65  CALCIUM 9.2   GFR: Estimated Creatinine Clearance: 54.9 mL/min (by C-G formula based on SCr of 0.65 mg/dL). Liver Function Tests: No results for input(s): "AST", "ALT", "ALKPHOS", "BILITOT", "PROT", "ALBUMIN" in the last 168 hours. No results for input(s): "LIPASE", "AMYLASE" in the last 168 hours. No results for input(s): "AMMONIA" in the last 168 hours. Coagulation Profile: No results for input(s): "INR", "PROTIME" in the last 168 hours. Cardiac Enzymes: No results for input(s): "CKTOTAL", "CKMB", "CKMBINDEX", "TROPONINI" in the last 168 hours. BNP (last 3 results) No results for input(s): "PROBNP" in the last 8760 hours. HbA1C: No results for input(s): "HGBA1C" in  the last 72 hours. CBG: Recent Labs  Lab 02/25/24 0844  GLUCAP 141*   Lipid Profile: No results for input(s): "CHOL", "HDL", "LDLCALC", "TRIG", "CHOLHDL", "LDLDIRECT" in the last 72 hours. Thyroid Function Tests: No results for input(s): "TSH", "T4TOTAL", "FREET4", "T3FREE", "THYROIDAB" in the last 72 hours. Anemia Panel: No results for input(s): "VITAMINB12", "FOLATE", "FERRITIN", "TIBC", "IRON", "RETICCTPCT" in the last 72 hours. Urine analysis:    Component Value Date/Time   COLORURINE YELLOW (A) 02/26/2024 1920   APPEARANCEUR CLEAR (A) 02/26/2024 1920   APPEARANCEUR Hazy (A) 05/07/2023 0846   LABSPEC 1.027 02/26/2024 1920   LABSPEC 1.028 03/11/2015 2310   PHURINE 6.0 02/26/2024 1920   GLUCOSEU >=500 (A) 02/26/2024 1920   GLUCOSEU >=500 03/11/2015 2310   HGBUR NEGATIVE 02/26/2024 1920   BILIRUBINUR NEGATIVE 02/26/2024 1920   BILIRUBINUR Negative 05/07/2023 0846   BILIRUBINUR Negative 03/11/2015 2310   KETONESUR 5 (A) 02/26/2024 1920   PROTEINUR NEGATIVE 02/26/2024 1920   UROBILINOGEN 1.0 09/19/2012 1920   NITRITE NEGATIVE 02/26/2024 1920   LEUKOCYTESUR TRACE (A) 02/26/2024 1920   LEUKOCYTESUR Negative 03/11/2015 2310    Radiological Exams on Admission: CT HEAD WO CONTRAST ( ) Result Date: 02/26/2024 CLINICAL DATA:  Syncopal episode, fell and hit left side of face. Bruising to left eye and hematoma on left side of forehead. EXAM: CT HEAD WITHOUT CONTRAST CT MAXILLOFACIAL WITHOUT CONTRAST CT CERVICAL SPINE WITHOUT CONTRAST TECHNIQUE: Multidetector CT imaging of the head, cervical spine, and maxillofacial structures were performed using the standard protocol without intravenous contrast. Multiplanar CT image reconstructions of the cervical spine and maxillofacial structures were also generated. RADIATION DOSE REDUCTION: This exam was performed according to the departmental dose-optimization program which includes automated exposure control, adjustment of the mA and/or kV  according to patient size and/or use of iterative reconstruction technique. COMPARISON:  CT head 04/04/2016 FINDINGS: CT HEAD FINDINGS Brain: No acute intracranial hemorrhage. No CT evidence of acute infarct. Nonspecific hypoattenuation in the periventricular and subcortical white matter favored to reflect chronic microvascular ischemic changes. Mild generalized parenchymal volume loss with slight frontal lobe predominance. No edema, mass effect, or midline shift. The basilar cisterns are patent. Ventricles: Prominence of the ventricles suggesting underlying parenchymal volume loss. Vascular: Atherosclerotic calcifications of the carotid siphons and intracranial vertebral arteries. No hyperdense vessel. Skull: No acute or aggressive finding. Other: Mastoid air  cells are clear. CT MAXILLOFACIAL FINDINGS Osseous: There is no evidence of acute maxillofacial fracture. Subtle irregularity and medial apex angulation of the right nasal bone without significant overlying soft tissue swelling likely reflecting chronic fracture. The nasal septum is midline without defect. No condylar dislocation. Orbits: The globes are intact. Lenses are normally located. Extraocular muscles are unremarkable. Normal appearance of the optic nerve sheath complexes. No retrobulbar hematoma. There is a small hematoma over the left supraorbital ridge near midline extending over the left aspect of the nasal bridge. There is mild soft tissue swelling involving the left preseptal soft tissues. Left upper and lower eyelid swelling more pronounced along the medial aspect. No evidence of postseptal orbital involvement. Sinuses: Minimal mucosal thickening in the alveolar recesses of the maxillary sinuses. No air-fluid levels. Soft tissues: Periorbital soft tissue swelling as above. CT CERVICAL SPINE FINDINGS Alignment: Straightening and slight reversal of the normal cervical lordosis. No listhesis. No facet subluxation or dislocation. Skull base and  vertebrae: No compression fracture or displaced fracture in the cervical spine. No suspicious osseous lesions. Soft tissues and spinal canal: No prevertebral fluid or swelling. No visible canal hematoma. Subcentimeter nodules in the thyroid. Disc levels: Mild intervertebral disc space narrowing at C4-5 and C5-6. Small disc osteophyte complexes at C4-5 and C5-6 without significant osseous spinal canal stenosis. Facet arthrosis at multiple levels. Foraminal narrowing most pronounced on the right at C4-5 and C5-6. Upper chest: Negative Other: None. IMPRESSION: CT HEAD: 1. No acute intracranial abnormality. 2. Mild chronic microvascular ischemic changes and parenchymal volume loss. CT MAXILLOFACIAL: 1. No evidence of acute maxillofacial fracture. 2. Small hematoma over the left supraorbital ridge extending over the left aspect of the nasal bridge. 3. Mild soft tissue swelling involving the left preseptal soft tissues. 4. Subtle irregularity and medial apex angulation of the right nasal bone without significant overlying soft tissue swelling likely reflecting chronic fracture. CT CERVICAL SPINE: 1. No evidence of acute fracture or traumatic malalignment. 2. Mild multilevel degenerative changes of the cervical spine. Electronically Signed   By: Emily Filbert M.D.   On: 02/26/2024 21:41   CT Cervical Spine Wo Contrast Result Date: 02/26/2024 CLINICAL DATA:  Syncopal episode, fell and hit left side of face. Bruising to left eye and hematoma on left side of forehead. EXAM: CT HEAD WITHOUT CONTRAST CT MAXILLOFACIAL WITHOUT CONTRAST CT CERVICAL SPINE WITHOUT CONTRAST TECHNIQUE: Multidetector CT imaging of the head, cervical spine, and maxillofacial structures were performed using the standard protocol without intravenous contrast. Multiplanar CT image reconstructions of the cervical spine and maxillofacial structures were also generated. RADIATION DOSE REDUCTION: This exam was performed according to the departmental  dose-optimization program which includes automated exposure control, adjustment of the mA and/or kV according to patient size and/or use of iterative reconstruction technique. COMPARISON:  CT head 04/04/2016 FINDINGS: CT HEAD FINDINGS Brain: No acute intracranial hemorrhage. No CT evidence of acute infarct. Nonspecific hypoattenuation in the periventricular and subcortical white matter favored to reflect chronic microvascular ischemic changes. Mild generalized parenchymal volume loss with slight frontal lobe predominance. No edema, mass effect, or midline shift. The basilar cisterns are patent. Ventricles: Prominence of the ventricles suggesting underlying parenchymal volume loss. Vascular: Atherosclerotic calcifications of the carotid siphons and intracranial vertebral arteries. No hyperdense vessel. Skull: No acute or aggressive finding. Other: Mastoid air cells are clear. CT MAXILLOFACIAL FINDINGS Osseous: There is no evidence of acute maxillofacial fracture. Subtle irregularity and medial apex angulation of the right nasal bone without significant overlying soft tissue  swelling likely reflecting chronic fracture. The nasal septum is midline without defect. No condylar dislocation. Orbits: The globes are intact. Lenses are normally located. Extraocular muscles are unremarkable. Normal appearance of the optic nerve sheath complexes. No retrobulbar hematoma. There is a small hematoma over the left supraorbital ridge near midline extending over the left aspect of the nasal bridge. There is mild soft tissue swelling involving the left preseptal soft tissues. Left upper and lower eyelid swelling more pronounced along the medial aspect. No evidence of postseptal orbital involvement. Sinuses: Minimal mucosal thickening in the alveolar recesses of the maxillary sinuses. No air-fluid levels. Soft tissues: Periorbital soft tissue swelling as above. CT CERVICAL SPINE FINDINGS Alignment: Straightening and slight reversal of  the normal cervical lordosis. No listhesis. No facet subluxation or dislocation. Skull base and vertebrae: No compression fracture or displaced fracture in the cervical spine. No suspicious osseous lesions. Soft tissues and spinal canal: No prevertebral fluid or swelling. No visible canal hematoma. Subcentimeter nodules in the thyroid. Disc levels: Mild intervertebral disc space narrowing at C4-5 and C5-6. Small disc osteophyte complexes at C4-5 and C5-6 without significant osseous spinal canal stenosis. Facet arthrosis at multiple levels. Foraminal narrowing most pronounced on the right at C4-5 and C5-6. Upper chest: Negative Other: None. IMPRESSION: CT HEAD: 1. No acute intracranial abnormality. 2. Mild chronic microvascular ischemic changes and parenchymal volume loss. CT MAXILLOFACIAL: 1. No evidence of acute maxillofacial fracture. 2. Small hematoma over the left supraorbital ridge extending over the left aspect of the nasal bridge. 3. Mild soft tissue swelling involving the left preseptal soft tissues. 4. Subtle irregularity and medial apex angulation of the right nasal bone without significant overlying soft tissue swelling likely reflecting chronic fracture. CT CERVICAL SPINE: 1. No evidence of acute fracture or traumatic malalignment. 2. Mild multilevel degenerative changes of the cervical spine. Electronically Signed   By: Emily Filbert M.D.   On: 02/26/2024 21:41   CT Maxillofacial Wo Contrast Result Date: 02/26/2024 CLINICAL DATA:  Syncopal episode, fell and hit left side of face. Bruising to left eye and hematoma on left side of forehead. EXAM: CT HEAD WITHOUT CONTRAST CT MAXILLOFACIAL WITHOUT CONTRAST CT CERVICAL SPINE WITHOUT CONTRAST TECHNIQUE: Multidetector CT imaging of the head, cervical spine, and maxillofacial structures were performed using the standard protocol without intravenous contrast. Multiplanar CT image reconstructions of the cervical spine and maxillofacial structures were also  generated. RADIATION DOSE REDUCTION: This exam was performed according to the departmental dose-optimization program which includes automated exposure control, adjustment of the mA and/or kV according to patient size and/or use of iterative reconstruction technique. COMPARISON:  CT head 04/04/2016 FINDINGS: CT HEAD FINDINGS Brain: No acute intracranial hemorrhage. No CT evidence of acute infarct. Nonspecific hypoattenuation in the periventricular and subcortical white matter favored to reflect chronic microvascular ischemic changes. Mild generalized parenchymal volume loss with slight frontal lobe predominance. No edema, mass effect, or midline shift. The basilar cisterns are patent. Ventricles: Prominence of the ventricles suggesting underlying parenchymal volume loss. Vascular: Atherosclerotic calcifications of the carotid siphons and intracranial vertebral arteries. No hyperdense vessel. Skull: No acute or aggressive finding. Other: Mastoid air cells are clear. CT MAXILLOFACIAL FINDINGS Osseous: There is no evidence of acute maxillofacial fracture. Subtle irregularity and medial apex angulation of the right nasal bone without significant overlying soft tissue swelling likely reflecting chronic fracture. The nasal septum is midline without defect. No condylar dislocation. Orbits: The globes are intact. Lenses are normally located. Extraocular muscles are unremarkable. Normal appearance of the  optic nerve sheath complexes. No retrobulbar hematoma. There is a small hematoma over the left supraorbital ridge near midline extending over the left aspect of the nasal bridge. There is mild soft tissue swelling involving the left preseptal soft tissues. Left upper and lower eyelid swelling more pronounced along the medial aspect. No evidence of postseptal orbital involvement. Sinuses: Minimal mucosal thickening in the alveolar recesses of the maxillary sinuses. No air-fluid levels. Soft tissues: Periorbital soft tissue  swelling as above. CT CERVICAL SPINE FINDINGS Alignment: Straightening and slight reversal of the normal cervical lordosis. No listhesis. No facet subluxation or dislocation. Skull base and vertebrae: No compression fracture or displaced fracture in the cervical spine. No suspicious osseous lesions. Soft tissues and spinal canal: No prevertebral fluid or swelling. No visible canal hematoma. Subcentimeter nodules in the thyroid. Disc levels: Mild intervertebral disc space narrowing at C4-5 and C5-6. Small disc osteophyte complexes at C4-5 and C5-6 without significant osseous spinal canal stenosis. Facet arthrosis at multiple levels. Foraminal narrowing most pronounced on the right at C4-5 and C5-6. Upper chest: Negative Other: None. IMPRESSION: CT HEAD: 1. No acute intracranial abnormality. 2. Mild chronic microvascular ischemic changes and parenchymal volume loss. CT MAXILLOFACIAL: 1. No evidence of acute maxillofacial fracture. 2. Small hematoma over the left supraorbital ridge extending over the left aspect of the nasal bridge. 3. Mild soft tissue swelling involving the left preseptal soft tissues. 4. Subtle irregularity and medial apex angulation of the right nasal bone without significant overlying soft tissue swelling likely reflecting chronic fracture. CT CERVICAL SPINE: 1. No evidence of acute fracture or traumatic malalignment. 2. Mild multilevel degenerative changes of the cervical spine. Electronically Signed   By: Emily Filbert M.D.   On: 02/26/2024 21:41   CT Chest Wo Contrast Result Date: 02/26/2024 CLINICAL DATA:  Chest trauma, blunt syncopal episode yesterday morning around 0300 while on the toilet EXAM: CT CHEST WITHOUT CONTRAST TECHNIQUE: Multidetector CT imaging of the chest was performed following the standard protocol without IV contrast. RADIATION DOSE REDUCTION: This exam was performed according to the departmental dose-optimization program which includes automated exposure control,  adjustment of the mA and/or kV according to patient size and/or use of iterative reconstruction technique. COMPARISON:  CT chest 11/10/2023, CT renal 05/25/2023, CT abdomen pelvis 03/21/2022, chest x-ray 05/30/2021 FINDINGS: Cardiovascular: The thoracic aorta is normal in caliber. The heart is normal in size. No significant pericardial effusion. Moderate atherosclerotic plaque. At least 3 vessel coronary calcification. Lungs/Pleura: Right lower lobe atelectasis. No focal consolidation. No pulmonary nodule. No pulmonary mass. No pulmonary contusion or laceration. No pneumatocele formation. No pleural effusion. No pneumothorax. No hemothorax. Mediastinum/Nodes: No pneumomediastinum. The central airways are patent. The esophagus is unremarkable.  Small hiatal hernia. The thyroid is unremarkable. Limited evaluation for hilar lymphadenopathy on this noncontrast study. No mediastinal or axillary lymphadenopathy. Musculoskeletal/Chest wall No chest wall mass. No acute rib or sternal fracture. No spinal fracture. Old healed left shoulder fracture. Chronic stable superior endplate T3 and inferior endplate T6 trace concavity suggestive of compression fractures. Upper Abdomen: Splenule noted. Redemonstration of parapelvic fluid dense lesions bilaterally consistent with known parapelvic cysts. Simple renal cysts, in the absence of clinically indicated signs/symptoms, require no independent follow-up. Bilateral 1-2 mm nephrolithiasis. No acute abnormality. IMPRESSION: 1. No acute intrathoracic traumatic injury. 2. No acute fracture or traumatic malalignment of the thoracic spine. 3. Aortic Atherosclerosis (ICD10-I70.0) -including coronary artery calcification. 4. Other imaging findings of potential clinical significance: Bilateral nephrolithiasis. Small hiatal hernia. Electronically Signed  By: Tish Frederickson M.D.   On: 02/26/2024 21:21     Data Reviewed: Relevant notes from primary care and specialist visits, past  discharge summaries as available in EHR, including Care Everywhere. Prior diagnostic testing as pertinent to current admission diagnoses Updated medications and problem lists for reconciliation ED course, including vitals, labs, imaging, treatment and response to treatment Triage notes, nursing and pharmacy notes and ED provider's notes Notable results as noted in HPI   Assessment and Plan: * Recurrent syncope History of syncopal event 03/2022 with negative cardiac workup Etiology undetermined, possibly vasovagal Head CT unrevealing Continuous cardiac monitoring, echocardiogram, carotid Doppler, EEG Will get neurology consult given history of seizure, negative prior cardiac workup and no regular neurology follow-up Orthostatic checks Valproate level Neurologic checks, fall and aspiration precautions Will consult cardiology.  Benefit from [employer].  Husband states she is frequently Tachycardic and will check TSH  Hypoxia Patient was hypoxic to 85% with ambulation in the ED, normalized with rest and continue to monitor CT chest showed no abnormalities D-dimer corrected for age was normal and patient not otherwise symptomatic for PE-can consider further imaging  Periorbital hematoma of left eye Ice packs and pain management No acute internal injuries on CT head C-spine and maxillofacial and CT chest  Seizure disorder (HCC) Continue Depakote Check levels EEG ordered  Hypothyroidism Will check TSH  COPD (chronic obstructive pulmonary disease) (HCC) Not acutely exacerbated Continue albuterol as needed  Nephrolithiasis No acute issues  Diabetes mellitus, type 2 (HCC) Sliding scale insulin coverage  Hypertension Normotensive Will hold antihypertensive tonight in the setting of syncope of undetermined etiology        DVT prophylaxis: Lovenox  Consults: neurology, [facility] cardiology  Advance Care Planning:   Code Status: Prior   Family Communication: Husband at  bedside  Disposition Plan: Back to previous home environment  Severity of Illness: The appropriate patient status for this patient is OBSERVATION. Observation status is judged to be reasonable and necessary in order to provide the required intensity of service to ensure the patient's safety. The patient's presenting symptoms, physical exam findings, and initial radiographic and laboratory data in the context of their medical condition is felt to place them at decreased risk for further clinical deterioration. Furthermore, it is anticipated that the patient will be medically stable for discharge from the hospital within 2 midnights of admission.   Author: Andris Baumann, MD 02/26/2024 11:04 PM  For on call review [email or web address].

## 2024-02-26 NOTE — Assessment & Plan Note (Addendum)
 Patient was hypoxic to 85% with ambulation in the ED, normalized with rest and continue to monitor CT chest showed no abnormalities D-dimer corrected for age was normal and patient not otherwise symptomatic for PE-can consider further imaging

## 2024-02-26 NOTE — Assessment & Plan Note (Signed)
 Normotensive Will hold antihypertensive tonight in the setting of syncope of undetermined etiology

## 2024-02-26 NOTE — ED Notes (Signed)
Dr. Fuller Plan at bedside for evaluation.

## 2024-02-27 ENCOUNTER — Other Ambulatory Visit: Payer: Self-pay

## 2024-02-27 ENCOUNTER — Observation Stay

## 2024-02-27 ENCOUNTER — Observation Stay
Admit: 2024-02-27 | Discharge: 2024-02-27 | Disposition: A | Discharge status: Home / Self Care | Attending: Student | Admitting: Student

## 2024-02-27 DIAGNOSIS — G40909 Epilepsy, unspecified, not intractable, without status epilepticus: Secondary | ICD-10-CM

## 2024-02-27 DIAGNOSIS — R55 Syncope and collapse: Secondary | ICD-10-CM | POA: Diagnosis not present

## 2024-02-27 LAB — ECHOCARDIOGRAM COMPLETE
AR max vel: 2.75 cm2
AV Area VTI: 2.53 cm2
AV Area mean vel: 2.63 cm2
AV Mean grad: 2 mmHg
AV Peak grad: 3.2 mmHg
Ao pk vel: 0.89 m/s
Area-P 1/2: 5.93 cm2
Height: 64 in
MV VTI: 2.54 cm2
S' Lateral: 2 cm
Weight: 2256 [oz_av]

## 2024-02-27 LAB — HEPATIC FUNCTION PANEL
ALT: 10 U/L (ref 0–44)
AST: 17 U/L (ref 15–41)
Albumin: 2.9 g/dL — ABNORMAL LOW (ref 3.5–5.0)
Alkaline Phosphatase: 41 U/L (ref 38–126)
Bilirubin, Direct: <0.1 mg/dL (ref 0.0–0.2)
Total Bilirubin: 0.7 mg/dL (ref 0.0–1.2)
Total Protein: 6 g/dL — ABNORMAL LOW (ref 6.5–8.1)

## 2024-02-27 LAB — BASIC METABOLIC PANEL
Anion gap: 13 (ref 5–15)
BUN: 7 mg/dL — ABNORMAL LOW (ref 8–23)
CO2: 26 mmol/L (ref 22–32)
Calcium: 8.5 mg/dL — ABNORMAL LOW (ref 8.9–10.3)
Chloride: 103 mmol/L (ref 98–111)
Creatinine, Ser: 0.64 mg/dL (ref 0.44–1.00)
GFR, Estimated: >60 mL/min (ref >60)
Glucose, Bld: 127 mg/dL — ABNORMAL HIGH (ref 70–99)
Potassium: 3.1 mmol/L — ABNORMAL LOW (ref 3.5–5.1)
Sodium: 142 mmol/L (ref 135–145)

## 2024-02-27 LAB — CBG MONITORING, ED
Glucose-Capillary: 107 mg/dL — ABNORMAL HIGH (ref 70–99)
Glucose-Capillary: 104 mg/dL — ABNORMAL HIGH (ref 70–99)
Glucose-Capillary: 171 mg/dL — ABNORMAL HIGH (ref 70–99)

## 2024-02-27 LAB — HEMOGLOBIN A1C
Hgb A1c MFr Bld: 7.2 % — ABNORMAL HIGH (ref 4.8–5.6)
Mean Plasma Glucose: 159.94 mg/dL

## 2024-02-27 LAB — MAGNESIUM: Magnesium: 1.9 mg/dL (ref 1.7–2.4)

## 2024-02-27 MED ORDER — POTASSIUM CHLORIDE CRYS ER 20 MEQ PO TBCR
20.0000 meq | EXTENDED_RELEASE_TABLET | ORAL | Status: DC
  Administered 2024-02-27 (×2): 20 meq via ORAL
  Filled 2024-02-27 (×2): qty 1

## 2024-02-27 MED ORDER — DIVALPROEX SODIUM ER 250 MG PO TB24
750.0000 mg | ORAL_TABLET | Freq: Every morning | ORAL | Status: DC
  Administered 2024-02-27: 750 mg via ORAL
  Filled 2024-02-27: qty 3

## 2024-02-27 MED ORDER — DIVALPROEX SODIUM ER 250 MG PO TB24: 750.0000 mg | ORAL_TABLET | Freq: Every morning | ORAL | Status: DC

## 2024-02-27 MED ORDER — DIVALPROEX SODIUM ER 250 MG PO TB24
750.0000 mg | ORAL_TABLET | Freq: Every day | ORAL | 0 refills | Status: AC
  Filled 2024-02-27: qty 90, 30d supply, fill #0

## 2024-02-27 MED ORDER — LOSARTAN POTASSIUM 50 MG PO TABS
100.0000 mg | ORAL_TABLET | Freq: Every day | ORAL | Status: DC
  Administered 2024-02-27: 100 mg via ORAL
  Filled 2024-02-27: qty 2

## 2024-02-27 MED ORDER — SPIRONOLACTONE 25 MG PO TABS
25.0000 mg | ORAL_TABLET | Freq: Every day | ORAL | Status: DC
  Administered 2024-02-27: 25 mg via ORAL
  Filled 2024-02-27: qty 1

## 2024-02-27 NOTE — Evaluation (Signed)
 Physical Therapy Evaluation Patient Details Name: Crystal Foley MRN: [id number] DOB: [birth year] Today's Date: 02/27/2024  History of Present Illness  Crystal Foley is a 73 y.o. female with medical history significant for HTN, type II DM, nephrolithiasis, seizure disorder on Depakote with no recent neurology follow up ,admitted for syncope 03/2022 of undetermined etiology(possibly tachycardia, per husband at bedside) with reassuring cardiac workup 08/2022 (echo and stress test), who presents to the ED for evaluation of 2 syncopal events occurring within a day of each other.   Clinical Impression  Pt admitted with above diagnosis. Pt currently with functional limitations due to the deficits listed below (see PT Problem List). Pt received upright in bed agreeable to PT eval. PTA pt fully independent.   To date, pt educated on log roll technique to exit bed. Good understanding and completion mod-I and CGA to stand due to L rib pain. Pt able to ambulate > 200' and use bathroom independently. Despite pain and slowed gait cadence, pt reports being close to baseline returning to bed with all needs in reach. Pt has helpful spouse that can assist as needed. Pt with all needs in reach. No acute PT needs identified with PT to sign off in house.      If plan is discharge home, recommend the following: A little help with walking and/or transfers   Can travel by private vehicle        Equipment Recommendations None recommended by PT  Recommendations for Other Services       Functional Status Assessment       Precautions / Restrictions Precautions Precautions: None Restrictions Weight Bearing Restrictions Per Provider Order: No      Mobility  Bed Mobility Overal bed mobility: Modified Independent             General bed mobility comments: use of bed features Patient Response: Cooperative  Transfers Overall transfer level: Needs assistance Equipment used: None Transfers: Sit to/from  Stand Sit to Stand: Contact guard assist           General transfer comment: bouts of momentum due to rib pain to stand.    Ambulation/Gait Ambulation/Gait assistance: Contact guard assist Gait Distance (Feet): 230 Feet Assistive device: None Gait Pattern/deviations: Step-to pattern       General Gait Details: reports quality of gait is at baseline but velocity is mildly diminished.  Stairs            Wheelchair Mobility     Tilt Bed Tilt Bed Patient Response: Cooperative  Modified Rankin (Stroke Patients Only)       Balance Overall balance assessment: Mild deficits observed, not formally tested                                           Pertinent Vitals/Pain Pain Assessment Pain Assessment: Faces Faces Pain Scale: Hurts little more Pain Location: L ribs Pain Descriptors / Indicators: Aching, Sore, Grimacing Pain Intervention(s): Limited activity within patient's tolerance, Monitored during session, Repositioned    Home Living Family/patient expects to be discharged to:: Private residence Living Arrangements: Spouse/significant other Available Help at Discharge: Family Type of Home: Apartment Home Access: Level entry       Home Layout: One level Home Equipment: None      Prior Function Prior Level of Function : Independent/Modified Independent  Extremity/Trunk Assessment   Upper Extremity Assessment Upper Extremity Assessment: Defer to OT evaluation    Lower Extremity Assessment Lower Extremity Assessment: Overall WFL for tasks assessed    Cervical / Trunk Assessment Cervical / Trunk Assessment: Normal  Communication        Cognition Arousal: Alert Behavior During Therapy: WFL for tasks assessed/performed   PT - Cognitive impairments: No apparent impairments                         Following commands: Intact       Cueing       General Comments      Exercises Other  Exercises Other Exercises: log roll technique,bracing for L rib comfort with coughing or sneezing.   Assessment/Plan    PT Assessment    PT Problem List         PT Treatment Interventions      PT Goals (Current goals can be found in the Care Plan section)  Acute Rehab PT Goals Patient Stated Goal: return home PT Goal Formulation: With patient Time For Goal Achievement: 03/12/24 Potential to Achieve Goals: Good    Frequency Min 1X/week     Co-evaluation               AM-PAC PT "6 Clicks" Mobility  Outcome Measure Help needed turning from your back to your side while in a flat bed without using bedrails?: A Little Help needed moving from lying on your back to sitting on the side of a flat bed without using bedrails?: A Little Help needed moving to and from a bed to a chair (including a wheelchair)?: A Little Help needed standing up from a chair using your arms (e.g., wheelchair or bedside chair)?: A Little Help needed to walk in hospital room?: A Little Help needed climbing 3-5 steps with a railing? : A Little 6 Click Score: 18    End of Session   Activity Tolerance: Patient tolerated treatment well Patient left: in bed;with call bell/phone within reach;with family/visitor present Nurse Communication: Mobility status PT Visit Diagnosis: Unsteadiness on feet (R26.81);Muscle weakness (generalized) (M62.81);Pain Pain - Right/Left: Left Pain - part of body:  (rib cage)    Time: 5621-3086 PT Time Calculation (min) (ACUTE ONLY): 16 min   Charges:   PT Evaluation $PT Eval Low Complexity: 1 Low   PT General Charges $$ ACUTE PT VISIT: 1 Visit         Shayanne Gomm M. Fairly IV, PT, DPT Physical Therapist- [facility]  [facility]  02/27/2024, 2:24 PM

## 2024-02-27 NOTE — Consult Note (Signed)
 NEUROLOGY CONSULT NOTE   Date of service: Foley 14, 2025 Patient Name: Crystal Foley MRN:  [id number] DOB:  1951-08-17 Chief Complaint: "Syncope versus seizure" Requesting Provider: Delfino Lovett, MD  History of Present Illness  Crystal Foley is a 73 y.o.  female with a history of seizures who presents with syncope. She is accompanied by her husband.  She experienced a syncopal episode at 3 AM on Foley 12, 2025 --she was undergoing a colonoscopy prep and could not get to the bathroom on time.  She had soiled herself and her husband had told her to get into the tub while he cleaned up the bathroom floor.  He subsequently observed that she had slumped over in the bathtub and hit her face.  She had a colonoscopy later that day which was uneventful. Subsequently, the next day she had a second syncopal event while sitting on a recliner.   She has had a couple of episodes in the middle of the night where she is called out to her husband for feeling unwell and her heart rates have been very elevated up into the 140s.  She has had mild shaking of bilateral upper extremities in that setting without loss of consciousness.  No other events of concern recently  Her Depakote level was slightly high when checked in the middle of the day; she typically takes her extended release dose every morning at 7 AM. She was mildly hypokalemic at 3.0, and her flu A/COVID panel was negative. A urinalysis showed glucosuria but was otherwise unremarkable.    Spell #  Started at age 45 after she experienced an electrocution event - Semiology: Generalized shaking - Prodome: Yells out - Post-spell: Confusion - Triggers: Flashing lights, missed medications, infections - Frequency: last seizure occurring over a decade ago  Risk factors:  Birth and development:  Febrile seizures in childhood --no Significant head trauma -- no traditional head trauma but did have an electrocution event Intracranial surgeries  --no Mengingitis/Encephalitis history --no Family history of seizures or developmental delay  daughter who had a single seizure in early childhood (pre-K/daycare), which was treated for a year without recurrence.  Prior medications: Dilantin stopped due to tongue swelling Some other medication, they do not recall details Last 20 years she has been on Depakote at a stable dose without side effects including no osteoporosis, weight gain, liver dysfunction etc.  Current dose is 750 mg ER every morning at 7 AM  She resides at [facility].     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)    Hematuria, gross    Hydronephrosis    Hypercholesterolemia    Hypertension    Hypothyroidism    Nephrolithiasis    Nephrolithiasis    Seizures (HCC)    last seizure 7-8 yrs ago   Shortness of breath dyspnea    with exertion    Past Surgical History:  Procedure Laterality Date   Bladder tack     COLONOSCOPY     COLONOSCOPY WITH PROPOFOL N/A 03/25/2018   Procedure: COLONOSCOPY WITH PROPOFOL;  Surgeon: Toledo, [name], MD;  Location: [facility] ENDOSCOPY;  Service: Gastroenterology;  Laterality: N/A;  COLONOSCOPY WITH PROPOFOL N/A 05/13/2018   Procedure: COLONOSCOPY WITH PROPOFOL;  Surgeon: Toledo, [name], MD;  Location: [facility] ENDOSCOPY;  Service: Gastroenterology;  Laterality: N/A;   COLONOSCOPY WITH PROPOFOL N/A 02/25/2024   Procedure: COLONOSCOPY WITH PROPOFOL;  Surgeon: Toledo, [name], MD;  Location: [facility] ENDOSCOPY;  Service: Gastroenterology;  Laterality: N/A;   CYSTOSCOPY W/ URETERAL STENT PLACEMENT  03/11/15   CYSTOSCOPY WITH RETROGRADE PYELOGRAM, URETEROSCOPY AND  STENT PLACEMENT Left 03/17/2015   Procedure: CYSTOSCOPY, STENT REMOVAL, LEFT URETEROSCOPY  WITH STONE REMOVAL WITH BASKET;  Surgeon: Bjorn Pippin, MD;  Location: [facility];  Service: Urology;  Laterality: Left;   POLYPECTOMY  02/25/2024   Procedure: POLYPECTOMY;  Surgeon: Toledo, [name], MD;  Location: [facility] END[facility];  Service: Gastroenterology;;   SHOULDER ARTHROSCOPY DISTAL CLAVICLE EXCISION AND OPEN ROTATOR CUFF REPAIR     TUBAL LIGATION      Family History: Family History  Problem Relation Age of Onset   Coronary artery disease Sister    Heart disease Sister    Brain cancer Father    Emphysema Mother     Social History  reports that she has never smoked. She has never used smokeless tobacco. She reports that she does not drink alcohol and does not use drugs.  Allergies  Allergen Reactions   Dilantin [Phenytoin Sodium Extended] Swelling   Glipizide Other (See Comments)   Metformin Diarrhea   Phenytoin Swelling    Medications   Current Facility-Administered Medications:    acetaminophen (TYLENOL) tablet 650 mg, 650 mg, Oral, Q6H PRN **OR** acetaminophen (TYLENOL) suppository 650 mg, 650 mg, Rectal, Q6H PRN, Crystal Baumann, MD   albuterol (PROVENTIL) (2.5 MG/3ML) 0.083% nebulizer solution 2.5 mg, 2.5 mg, Inhalation, Q6H PRN, Crystal Baumann, MD   atorvastatin (LIPITOR) tablet 40 mg, 40 mg, Oral, Daily, Crystal Royal V, MD, 40 mg at 02/27/24 1050   divalproex (DEPAKOTE ER) 24 hr tablet 750 mg, 750 mg, Oral, q morning, Crystal Royal V, MD, 750 mg at 02/27/24 1049   enoxaparin (LOVENOX) injection 40 mg, 40 mg, Subcutaneous, Q24H, Crystal Baumann, MD, 40 mg at 02/26/24 2338   HYDROcodone-acetaminophen (NORCO/VICODIN) 5-325 MG per tablet 1-2 tablet, 1-2 tablet, Oral, Q4H PRN, Crystal Baumann, MD   insulin aspart (novoLOG) injection 0-15 Units, 0-15 Units, Subcutaneous, TID WC, Crystal Baumann, MD   insulin aspart (novoLOG) injection 0-5 Units, 0-5 Units, Subcutaneous, QHS, Crystal Foley, Crystal Pink, MD   losartan (COZAAR) tablet 100 mg, 100 mg, Oral, Daily, Crystal Foley, Crystal Foley, Crystal Foley, 100 mg at 02/27/24 1049   ondansetron (ZOFRAN) tablet 4 mg, 4 mg, Oral, Q6H PRN **OR** ondansetron (ZOFRAN) injection 4 mg, 4 mg, Intravenous, Q6H PRN, Crystal Baumann, MD   potassium chloride SA (KLOR-CON M) CR tablet 20 mEq, 20 mEq, Oral, Q3H, Crystal Foley, RPH, 20 mEq at 02/27/24 1049   sodium chloride flush (NS) 0.9 % injection 3 mL, 3 mL, Intravenous, Q12H, Crystal Royal V, MD, 3 mL at 02/27/24 1052   spironolactone (ALDACTONE) tablet 25 mg, 25 mg, Oral, Daily, Crystal Foley, Crystal Foley, Crystal Foley, 25 mg at 02/27/24 1049  Current Outpatient Medications:    atorvastatin (LIPITOR) 40 MG tablet, Take 40 mg by mouth daily., Disp: , Rfl:    divalproex (DEPAKOTE ER) 250 MG 24 hr tablet, Take 500-750 mg by mouth 2 (two) times daily. Take 500mg  in morning and take 750mg  at night, Disp: , Rfl:    JARDIANCE 10 MG TABS tablet, Take 10 mg by mouth daily., Disp: , Rfl:  Semaglutide 14 MG TABS, Take 14 mg by mouth daily., Disp: , Rfl:    spironolactone (ALDACTONE) 25 MG tablet, Take 1 tablet by mouth daily., Disp: , Rfl:    Trospium Chloride 60 MG CP24, TAKE 1 CAPSULE BY MOUTH EVERY DAY, Disp: 90 capsule, Rfl: 3   albuterol (PROVENTIL HFA;VENTOLIN HFA) 108 (90 BASE) MCG/ACT inhaler, Inhale 1 puff into the lungs every 6 (six) hours as needed for wheezing or shortness of breath., Disp: , Rfl:    Docusate Sodium (DSS) 100 MG CAPS, Take by mouth., Disp: , Rfl:    estradiol (ESTRACE VAGINAL) 0.1 MG/GM vaginal cream, Apply 0.5mg  (pea-sized amount)  just inside the vaginal introitus with a finger-tip on Monday, Wednesday and Friday nights., Disp: 30 g, Rfl: 12   losartan (COZAAR) 100 MG tablet, Take 1 tablet by mouth daily., Disp: , Rfl:    Na Sulfate-K Sulfate-Mg Sulfate concentrate (SUPREP) 17.5-3.13-1.6 GM/177ML SOLN, Take 354 mLs by mouth as directed., Disp: , Rfl:   Vitals   Vitals:   02/27/24 0431 02/27/24 0841 02/27/24 0845  02/27/24 1000  BP:  (!) 151/81  (!) 127/51  Pulse:  99  96  Resp:  20  14  Temp: 97.8 F (36.6 C)  98.2 F (36.8 C)   TempSrc: Oral     SpO2:  95%  90%  Weight:      Height:        Body mass index is 24.2 kg/m.  Physical Exam   Constitutional: Appears comfortable, some wincing with movement of the left side due to rib pain from her initial syncopal event Psych: Affect appropriate to situation.  Pleasant and cooperative HENT: No OP obstruction, periorbital hematoma left more than right Cardiovascular: Normal rate and regular rhythm.  Respiratory: Effort normal, non-labored breathing.  GI: Soft.  No distension. There is no tenderness.    Neurologic Examination   Neuro: Mental Status: Patient is awake, alert, oriented to person, place, month, year, and situation. Patient is able to give a clear and coherent history. No signs of aphasia or neglect Cranial Nerves: II: Visual Fields are full. Pupils are equal, round, and reactive to light.   III,IV, VI: EOMI without ptosis or diploplia.  Foley: Facial sensation is symmetric to light touch VII: Facial movement is symmetric.  VIII: hearing is intact to voice X: Uvula elevates symmetrically XI: Shoulder shrug is symmetric. XII: tongue is midline without atrophy or fasciculations.  Motor: 5/5 strength was present in all four extremities, other than some mild limitation on the left arm and leg due to pain Sensory: Sensation is symmetric to light touch and temperature in the arms and legs. Deep Tendon Reflexes: 2+ and symmetric in the biceps and 4+ and symmetric patellae with approximately 3-4 beats of clonus.  Cerebellar: FNF and HKS are intact bilaterally   Labs/Imaging/Neurodiagnostic studies   CBC:  Recent Labs  Lab 03/26/2024 1741  WBC 6.2  HGB 13.8  HCT 40.0  MCV 91.5  PLT 167   Basic Metabolic Panel:  Lab Results  Component Value Date   NA 142 02/27/2024   K 3.1 (L) 02/27/2024   CO2 26 02/27/2024   GLUCOSE  127 (H) 02/27/2024   BUN 7 (L) 02/27/2024   CREATININE 0.64 02/27/2024   CALCIUM 8.5 (L) 02/27/2024   GFRNONAA >60 02/27/2024   GFRAA >60 05/02/2020   Lipid Panel: No results found for: "LDLCALC" HgbA1c:  Lab Results  Component Value Date   HGBA1C 7.2 (H) 26-Mar-2024   Urine  Drug Screen: No results found for: "LABOPIA", "COCAINSCRNUR", "LABBENZ", "AMPHETMU", "THCU", "LABBARB"  Alcohol Level No results found for: "ETH" INR No results found for: "INR" APTT No results found for: "APTT" AED levels: No results found for: "PHENYTOIN", "ZONISAMIDE", "LAMOTRIGINE", "LEVETIRACETA"  CT Head without contrast(Personally reviewed): 1. No acute intracranial abnormality. 2. Mild chronic microvascular ischemic changes and parenchymal volume loss.  CT maxillofacial showed a likely chronic right nasal bone fracture with mild soft tissue changes. A CT cervical spine showed no acute abnormalities.  ASSESSMENT   ZYRIAH MASK is a 73 y.o. female with a known seizure disorder well-controlled on Depakote presenting with syncopal events.  These do not sound like seizures.  There is no indication for inpatient EEG or MRI brain in that setting.  She does have some mild difficulty exactly explaining her medications etc. to me on my evaluation but her husband reports that this is not an acute or even subacute issue and is at her normal baseline.  She can continue to follow-up with her outpatient PCP.  Slightly elevated Depakote level was felt to be due to nontrough level obtained.  Do recommend obtaining a trough level with her next morning labs which she reports she is scheduled for within the next month with her primary care physician  Outpatient neurology referral only if she needs to change from Depakote due to side effects or other health concerns that arise, or if new neurological concerns arise  RECOMMENDATIONS  -Continue home Depakote 750 mg every morning at 7 AM -Add on hepatic function panel to  confirm she is tolerating Depakote well -Continue safety monitoring of Depakote with PCP (Depakote levels, liver function tests, and CBC with diff every 6-12 months) -Inpatient neurology will follow-up LFTs otherwise we will sign off at this time.  Discussed with primary team via secure chat.  Please reach out if any additional questions or concerns arise ______________________________________________________________________  Signed, Crystal Councilman, MD [facility] coverage for [facility] is from 8 AM to 4 AM in-house and 4 PM to 8 PM by telephone/video. 8 PM to 8 AM emergent questions or overnight urgent questions should be addressed to Teleneurology On-call or [facility] neurohospitalist; contact information can be found on AMION

## 2024-02-27 NOTE — Consult Note (Signed)
 PHARMACY CONSULT NOTE - ELECTROLYTES  Pharmacy Consult for Electrolyte Monitoring and Replacement   Recent Labs: Height: 5\' 4"  (162.6 cm) Weight: 64 kg (141 lb) IBW/kg (Calculated) : 54.7 Estimated Creatinine Clearance: 54.9 mL/min (by C-G formula based on SCr of 0.64 mg/dL). Potassium (mmol/L)  Date Value  02/27/2024 3.1 (L)  03/11/2015 4.5   Magnesium (mg/dL)  Date Value  16/09/9603 1.9   Calcium (mg/dL)  Date Value  54/08/8118 8.5 (L)   Calcium, Total (mg/dL)  Date Value  14/78/2956 9.0   Albumin (g/dL)  Date Value  21/30/8657 3.6   Sodium (mmol/L)  Date Value  02/27/2024 142  03/11/2015 135    Assessment  Crystal Foley is a 73 y.o. female presenting with syncope after having a colonoscopy yesterday (and associated bowel prep). PMH significant for HTN, T2DM, nephrolithiasis, seizure disorder on Depakote. Pharmacy has been consulted to monitor and replace electrolytes.  Diet: Heart healthy MIVF: n/a Pertinent medications: n/a  Goal of Therapy: Electrolytes WNL  Plan:  K 3.1 >> will order potassium chloride 20 mEq PO x 3 No other electrolyte replacement currently warranted Check BMP, Mg, Phos with AM labs  Thank you for allowing pharmacy to be a part of this patient's care.  Will M. Dareen Piano, PharmD Clinical Pharmacist 02/27/2024 8:57 AM

## 2024-02-27 NOTE — Consult Note (Signed)
 PHARMACY CONSULT NOTE - ELECTROLYTES  Pharmacy Consult for Electrolyte Monitoring and Replacement   Recent Labs: Height: 5\' 4"  (162.6 cm) Weight: 64 kg (141 lb) IBW/kg (Calculated) : 54.7 Estimated Creatinine Clearance: 54.9 mL/min (by C-G formula based on SCr of 0.64 mg/dL). Potassium (mmol/L)  Date Value  02/27/2024 3.1 (L)  03/11/2015 4.5   Magnesium (mg/dL)  Date Value  16/09/9603 1.9   Calcium (mg/dL)  Date Value  54/08/8118 8.5 (L)   Calcium, Total (mg/dL)  Date Value  14/78/2956 9.0   Albumin (g/dL)  Date Value  21/30/8657 3.6   Sodium (mmol/L)  Date Value  02/27/2024 142  03/11/2015 135    Assessment  Crystal Foley is a 73 y.o. female presenting with syncope after having a colonoscopy yesterday (and associated bowel prep). PMH significant for HTN, T2DM, nephrolithiasis, seizure disorder on Depakote. Pharmacy has been consulted to monitor and replace electrolytes.  Diet: Heart healthy MIVF: n/a Pertinent medications: n/a  Goal of Therapy: Electrolytes WNL  Plan:  K 3.1 >> will order potassium chloride 20 mEq PO x 3 No other electrolyte replacement currently warranted Check BMP, Mg, Phos with AM labs  Thank you for allowing pharmacy to be a part of this patient's care.  Orson Aloe, PharmD Clinical Pharmacist 02/27/2024 8:32 AM

## 2024-02-27 NOTE — ED Notes (Signed)
 Pt ambulates to bathroom w/ this RN. Urinates.

## 2024-02-27 NOTE — Progress Notes (Signed)
*  PRELIMINARY RESULTS* Echocardiogram 2D Echocardiogram has been performed.  Carolyne Fiscal 02/27/2024, 12:51 PM

## 2024-02-27 NOTE — Consult Note (Addendum)
 [facility] CLINIC CARDIOLOGY CONSULT NOTE       Patient ID: Crystal Foley MRN: [id number] DOB/AGE: 1951/12/05 73 y.o.  Admit date: 02/26/2024 Referring Physician Dr. Lindajo Royal Primary Physician Dareen Piano Marya Amsler, MD  Primary Cardiologist Dr. Juliann Pares Reason for Consultation syncope  HPI: Crystal Foley is a 73 y.o. female  with a past medical history of hypertension, type 2 diabetes, seizure disorder, history of syncope who presented to the ED on 02/26/2024 for multiple syncopal episodes. Cardiology was consulted for further evaluation.   Patient states that while completing colonoscopy prep on Tuesday around 3 AM she woke up and had to have a bowel movement.  While in the bathroom she tried to get in the bathtub but then felt like she was going to have another bowel movement so she tried to walk to the toilet and at that time had a syncopal episode that only lasted a few seconds.  She proceeded with her colonoscopy as scheduled and tolerated this well.  Yesterday while admitting she had another syncopal episode that was preceded by tunnel vision and lightheadedness.  The first responders at her assisted living facility came to evaluate her and while with them she had another syncopal episode and she was brought to the ED for further evaluation.  Workup in the ED notable for creatinine 0.65, potassium 3.0, hemoglobin 13.8, WBC 6.2.  Troponins normal x 2 at 6, 6.  She was started on IV fluids in the ED.  At the time of my evaluation this morning patient is resting comfortably in ED stretcher.  She reports that overall she is feeling much better.  We discussed her recent episodes of syncope in further detail.  She states that she has a history of this but it has been a few years since she has had an episode.  Denies any palpitations, chest pain, shortness of breath.  States that she is overall quite active and able to do all the activities that she wishes without any functional limitations.   States that her syncopal episodes only last a few seconds before she regains consciousness.  She did fall when she had the first episode and because of this has bruising to her face.  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)    Hematuria, gross    Hydronephrosis    Hypercholesterolemia    Hypertension    Hypothyroidism    Nephrolithiasis    Nephrolithiasis    Seizures (HCC)    last seizure 7-8 yrs ago   Shortness of breath dyspnea    with exertion    Past Surgical History:  Procedure Laterality Date   Bladder tack     COLONOSCOPY     COLONOSCOPY WITH PROPOFOL N/A 03/25/2018   Procedure: COLONOSCOPY WITH PROPOFOL;  Surgeon: Toledo, [name], MD;  Location: [facility];  Service: Gastroenterology;  Laterality: N/A;   COLONOSCOPY WITH PROPOFOL N/A 05/13/2018   Procedure: COLONOSCOPY WITH PROPOFOL;  Surgeon: Toledo, [name], MD;  Location: [facility];  Service: Gastroenterology;  Laterality: N/A;   COLONOSCOPY WITH PROPOFOL N/A 02/25/2024   Procedure: COLONOSCOPY WITH PROPOFOL;  Surgeon: Toledo, [name], MD;  Location: [facility];  Service: Gastroenterology;  Laterality: N/A;   CYSTOSCOPY W/ URETERAL STENT PLACEMENT  03/11/15   CYSTOSCOPY WITH RETROGRADE PYELOGRAM, URETEROSCOPY AND STENT PLACEMENT Left 03/17/2015   Procedure: CYSTOSCOPY, STENT REMOVAL, LEFT URETEROSCOPY  WITH STONE REMOVAL WITH BASKET;  Surgeon: Bjorn Pippin, MD;  Location: [facility];  Service: Urology;  Laterality: Left;   POLYPECTOMY  02/25/2024   Procedure: POLYPECTOMY;  Surgeon: Toledo, [name], MD;  Location: [facility];  Service: Gastroenterology;;   SHOULDER ARTHROSCOPY DISTAL CLAVICLE EXCISION AND OPEN  ROTATOR CUFF REPAIR     TUBAL LIGATION      (Not in a hospital admission)  Social History   Socioeconomic History   Marital status: Married    Spouse name: Not on file   Number of children: Not on file   Years of education: Not on file   Highest education level: Not on file  Occupational History   Not on file  Tobacco Use   Smoking status: Never   Smokeless tobacco: Never  Vaping Use   Vaping status: Never Used  Substance and Sexual Activity   Alcohol use: No   Drug use: Never   Sexual activity: Not Currently    Comment: Married   Other Topics Concern   Not on file  Social History Narrative   Not on file   Social Drivers of Health   Financial Resource Strain: Low Risk  (01/14/2024)   Received [facility]   Overall Financial Resource Strain (CARDIA)    Difficulty of Paying Living Expenses: Not very hard  Food Insecurity: No Food Insecurity (01/14/2024)   Received from [facility]   Hunger Vital Sign    Worried About Running Out of Food in the Last Year: Never true    Ran Out of Food in the Last Year: Never true  Transportation Needs: No Transportation Needs (01/14/2024)   Received from [facility] - Transportation    In the past 12 months, has lack of transportation kept you from medical appointments or from getting medications?: No    Lack of Transportation (Non-Medical): No  Physical Activity: Not on file  Stress: Not on file  Social Connections: Not on file  Intimate Partner Violence: Not on file    Family History  Problem Relation Age of Onset   Coronary artery disease Sister    Heart disease Sister    Brain cancer Father    Emphysema Mother      Vitals:   02/27/24 0430 02/27/24 0431 02/27/24 0841 02/27/24 0845  BP: (!) 146/74  (!) 151/81   Pulse: 87  99   Resp: 12  20   Temp:  97.8 F (36.6 C)  98.2 F (36.8 C)  TempSrc:  Oral    SpO2: 98%  95%   Weight:      Height:         PHYSICAL EXAM General: Well-appearing elderly female, well nourished, in no acute distress. HEENT: Normocephalic and atraumatic.  Bruising noted to face. Neck: No JVD.  Lungs: Normal respiratory effort on room air. Clear bilaterally to auscultation. No wheezes, crackles, rhonchi.  Heart: HRRR. Normal S1 and S2 without gallops or murmurs.  Abdomen: Non-distended appearing.  Msk: Normal strength and tone for age. Extremities: Warm and well perfused. No clubbing, cyanosis.  No edema.  Neuro: Alert and oriented X 3. Psych: Answers questions appropriately.   Labs: Basic Metabolic Panel: Recent Labs    02/26/24 1741 02/27/24 0800  NA 138 142  K 3.0* 3.1*  CL 100 103  CO2 26 26  GLUCOSE 163* 127*  BUN 8 7*  CREATININE 0.65 0.64  CALCIUM 9.2 8.5*  MG  --  1.9   Liver Function Tests: No results for input(s): "AST", "ALT", "ALKPHOS", "BILITOT", "PROT", "ALBUMIN" in the last 72 hours. No results for input(s): "LIPASE", "AMYLASE" in the last 72 hours. CBC: Recent Labs    02/26/24 1741  WBC 6.2  HGB 13.8  HCT 40.0  MCV 91.5  PLT 167   Cardiac Enzymes: Recent Labs    02/26/24 1741 02/26/24 2041  TROPONINIHS 6 6   BNP: No results for input(s): "BNP" in the last 72 hours. D-Dimer: Recent Labs    02/26/24 2139  DDIMER 0.67*   Hemoglobin A1C: Recent Labs    02/26/24 2341  HGBA1C 7.2*   Fasting Lipid Panel: No results for input(s): "CHOL", "HDL", "LDLCALC", "TRIG", "CHOLHDL", "LDLDIRECT" in the last 72 hours. Thyroid Function Tests: Recent Labs    02/26/24 2044  TSH 1.549   Anemia Panel: No results for input(s): "VITAMINB12", "FOLATE", "FERRITIN", "TIBC", "IRON", "RETICCTPCT" in the last 72 hours.   Radiology: US Carotid Bilateral Result Date: 02/27/2024 CLINICAL DATA:  Syncope and collapse EXAM: BILATERAL CAROTID DUPLEX ULTRASOUND TECHNIQUE: Wallace Cullens scale imaging, color Doppler and duplex ultrasound were performed of bilateral carotid and vertebral arteries  in the neck. COMPARISON:  None Available. FINDINGS: Criteria: Quantification of carotid stenosis is based on velocity parameters that correlate the residual internal carotid diameter with NASCET-based stenosis levels, using the diameter of the distal internal carotid lumen as the denominator for stenosis measurement. The following velocity measurements were obtained: RIGHT ICA: 69 cm/sec CCA: 63 cm/sec SYSTOLIC ICA/CCA RATIO:  1.3 ECA: 63 cm/sec LEFT ICA: 49 cm/sec CCA: 89 cm/sec SYSTOLIC ICA/CCA RATIO:  0.8 ECA: 55 cm/sec RIGHT CAROTID ARTERY: Minimal visualized plaque RIGHT VERTEBRAL ARTERY:  Antegrade flow LEFT CAROTID ARTERY:  Minimal visualized plaque LEFT VERTEBRAL ARTERY:  Antegrade flow IMPRESSION: No significant stenosis of internal carotid arteries. Electronically Signed   By: Deatra Robinson M.D.   On: 02/27/2024 03:17   CT HEAD WO CONTRAST ( ) Result Date: 02/26/2024 CLINICAL DATA:  Syncopal episode, fell and hit left side of face. Bruising to left eye and hematoma on left side of forehead. EXAM: CT HEAD WITHOUT CONTRAST CT MAXILLOFACIAL WITHOUT CONTRAST CT CERVICAL SPINE WITHOUT CONTRAST TECHNIQUE: Multidetector CT imaging of the head, cervical spine, and maxillofacial structures were performed using the standard protocol without intravenous contrast. Multiplanar CT image reconstructions of the cervical spine and maxillofacial structures were also generated. RADIATION DOSE REDUCTION: This exam was performed according to the departmental dose-optimization program which includes automated exposure control, adjustment of the mA and/or kV according to patient size and/or use of iterative reconstruction technique. COMPARISON:  CT head 04/04/2016 FINDINGS: CT HEAD FINDINGS Brain: No acute intracranial hemorrhage. No CT evidence of acute infarct. Nonspecific hypoattenuation in the periventricular and subcortical white matter favored to reflect chronic microvascular ischemic changes. Mild generalized  parenchymal volume loss with slight frontal lobe predominance. No edema, mass effect, or midline shift. The basilar cisterns are patent. Ventricles: Prominence of the ventricles suggesting underlying parenchymal volume loss. Vascular: Atherosclerotic calcifications of the carotid siphons and intracranial vertebral arteries. No hyperdense vessel. Skull: No acute or aggressive finding. Other: Mastoid air cells are clear. CT MAXILLOFACIAL FINDINGS Osseous: There is no evidence of acute maxillofacial fracture. Subtle irregularity and medial apex angulation of the right nasal bone without significant overlying soft tissue swelling likely  reflecting chronic fracture. The nasal septum is midline without defect. No condylar dislocation. Orbits: The globes are intact. Lenses are normally located. Extraocular muscles are unremarkable. Normal appearance of the optic nerve sheath complexes. No retrobulbar hematoma. There is a small hematoma over the left supraorbital ridge near midline extending over the left aspect of the nasal bridge. There is mild soft tissue swelling involving the left preseptal soft tissues. Left upper and lower eyelid swelling more pronounced along the medial aspect. No evidence of postseptal orbital involvement. Sinuses: Minimal mucosal thickening in the alveolar recesses of the maxillary sinuses. No air-fluid levels. Soft tissues: Periorbital soft tissue swelling as above. CT CERVICAL SPINE FINDINGS Alignment: Straightening and slight reversal of the normal cervical lordosis. No listhesis. No facet subluxation or dislocation. Skull base and vertebrae: No compression fracture or displaced fracture in the cervical spine. No suspicious osseous lesions. Soft tissues and spinal canal: No prevertebral fluid or swelling. No visible canal hematoma. Subcentimeter nodules in the thyroid. Disc levels: Mild intervertebral disc space narrowing at C4-5 and C5-6. Small disc osteophyte complexes at C4-5 and C5-6  without significant osseous spinal canal stenosis. Facet arthrosis at multiple levels. Foraminal narrowing most pronounced on the right at C4-5 and C5-6. Upper chest: Negative Other: None. IMPRESSION: CT HEAD: 1. No acute intracranial abnormality. 2. Mild chronic microvascular ischemic changes and parenchymal volume loss. CT MAXILLOFACIAL: 1. No evidence of acute maxillofacial fracture. 2. Small hematoma over the left supraorbital ridge extending over the left aspect of the nasal bridge. 3. Mild soft tissue swelling involving the left preseptal soft tissues. 4. Subtle irregularity and medial apex angulation of the right nasal bone without significant overlying soft tissue swelling likely reflecting chronic fracture. CT CERVICAL SPINE: 1. No evidence of acute fracture or traumatic malalignment. 2. Mild multilevel degenerative changes of the cervical spine. Electronically Signed   By: Emily Filbert M.D.   On: 02/26/2024 21:41   CT Cervical Spine Wo Contrast Result Date: 02/26/2024 CLINICAL DATA:  Syncopal episode, fell and hit left side of face. Bruising to left eye and hematoma on left side of forehead. EXAM: CT HEAD WITHOUT CONTRAST CT MAXILLOFACIAL WITHOUT CONTRAST CT CERVICAL SPINE WITHOUT CONTRAST TECHNIQUE: Multidetector CT imaging of the head, cervical spine, and maxillofacial structures were performed using the standard protocol without intravenous contrast. Multiplanar CT image reconstructions of the cervical spine and maxillofacial structures were also generated. RADIATION DOSE REDUCTION: This exam was performed according to the departmental dose-optimization program which includes automated exposure control, adjustment of the mA and/or kV according to patient size and/or use of iterative reconstruction technique. COMPARISON:  CT head 04/04/2016 FINDINGS: CT HEAD FINDINGS Brain: No acute intracranial hemorrhage. No CT evidence of acute infarct. Nonspecific hypoattenuation in the periventricular and  subcortical white matter favored to reflect chronic microvascular ischemic changes. Mild generalized parenchymal volume loss with slight frontal lobe predominance. No edema, mass effect, or midline shift. The basilar cisterns are patent. Ventricles: Prominence of the ventricles suggesting underlying parenchymal volume loss. Vascular: Atherosclerotic calcifications of the carotid siphons and intracranial vertebral arteries. No hyperdense vessel. Skull: No acute or aggressive finding. Other: Mastoid air cells are clear. CT MAXILLOFACIAL FINDINGS Osseous: There is no evidence of acute maxillofacial fracture. Subtle irregularity and medial apex angulation of the right nasal bone without significant overlying soft tissue swelling likely reflecting chronic fracture. The nasal septum is midline without defect. No condylar dislocation. Orbits: The globes are intact. Lenses are normally located. Extraocular muscles are unremarkable. Normal appearance of the optic  nerve sheath complexes. No retrobulbar hematoma. There is a small hematoma over the left supraorbital ridge near midline extending over the left aspect of the nasal bridge. There is mild soft tissue swelling involving the left preseptal soft tissues. Left upper and lower eyelid swelling more pronounced along the medial aspect. No evidence of postseptal orbital involvement. Sinuses: Minimal mucosal thickening in the alveolar recesses of the maxillary sinuses. No air-fluid levels. Soft tissues: Periorbital soft tissue swelling as above. CT CERVICAL SPINE FINDINGS Alignment: Straightening and slight reversal of the normal cervical lordosis. No listhesis. No facet subluxation or dislocation. Skull base and vertebrae: No compression fracture or displaced fracture in the cervical spine. No suspicious osseous lesions. Soft tissues and spinal canal: No prevertebral fluid or swelling. No visible canal hematoma. Subcentimeter nodules in the thyroid. Disc levels: Mild  intervertebral disc space narrowing at C4-5 and C5-6. Small disc osteophyte complexes at C4-5 and C5-6 without significant osseous spinal canal stenosis. Facet arthrosis at multiple levels. Foraminal narrowing most pronounced on the right at C4-5 and C5-6. Upper chest: Negative Other: None. IMPRESSION: CT HEAD: 1. No acute intracranial abnormality. 2. Mild chronic microvascular ischemic changes and parenchymal volume loss. CT MAXILLOFACIAL: 1. No evidence of acute maxillofacial fracture. 2. Small hematoma over the left supraorbital ridge extending over the left aspect of the nasal bridge. 3. Mild soft tissue swelling involving the left preseptal soft tissues. 4. Subtle irregularity and medial apex angulation of the right nasal bone without significant overlying soft tissue swelling likely reflecting chronic fracture. CT CERVICAL SPINE: 1. No evidence of acute fracture or traumatic malalignment. 2. Mild multilevel degenerative changes of the cervical spine. Electronically Signed   By: Emily Filbert M.D.   On: 02/26/2024 21:41   CT Maxillofacial Wo Contrast Result Date: 02/26/2024 CLINICAL DATA:  Syncopal episode, fell and hit left side of face. Bruising to left eye and hematoma on left side of forehead. EXAM: CT HEAD WITHOUT CONTRAST CT MAXILLOFACIAL WITHOUT CONTRAST CT CERVICAL SPINE WITHOUT CONTRAST TECHNIQUE: Multidetector CT imaging of the head, cervical spine, and maxillofacial structures were performed using the standard protocol without intravenous contrast. Multiplanar CT image reconstructions of the cervical spine and maxillofacial structures were also generated. RADIATION DOSE REDUCTION: This exam was performed according to the departmental dose-optimization program which includes automated exposure control, adjustment of the mA and/or kV according to patient size and/or use of iterative reconstruction technique. COMPARISON:  CT head 04/04/2016 FINDINGS: CT HEAD FINDINGS Brain: No acute intracranial  hemorrhage. No CT evidence of acute infarct. Nonspecific hypoattenuation in the periventricular and subcortical white matter favored to reflect chronic microvascular ischemic changes. Mild generalized parenchymal volume loss with slight frontal lobe predominance. No edema, mass effect, or midline shift. The basilar cisterns are patent. Ventricles: Prominence of the ventricles suggesting underlying parenchymal volume loss. Vascular: Atherosclerotic calcifications of the carotid siphons and intracranial vertebral arteries. No hyperdense vessel. Skull: No acute or aggressive finding. Other: Mastoid air cells are clear. CT MAXILLOFACIAL FINDINGS Osseous: There is no evidence of acute maxillofacial fracture. Subtle irregularity and medial apex angulation of the right nasal bone without significant overlying soft tissue swelling likely reflecting chronic fracture. The nasal septum is midline without defect. No condylar dislocation. Orbits: The globes are intact. Lenses are normally located. Extraocular muscles are unremarkable. Normal appearance of the optic nerve sheath complexes. No retrobulbar hematoma. There is a small hematoma over the left supraorbital ridge near midline extending over the left aspect of the nasal bridge. There is mild soft tissue  swelling involving the left preseptal soft tissues. Left upper and lower eyelid swelling more pronounced along the medial aspect. No evidence of postseptal orbital involvement. Sinuses: Minimal mucosal thickening in the alveolar recesses of the maxillary sinuses. No air-fluid levels. Soft tissues: Periorbital soft tissue swelling as above. CT CERVICAL SPINE FINDINGS Alignment: Straightening and slight reversal of the normal cervical lordosis. No listhesis. No facet subluxation or dislocation. Skull base and vertebrae: No compression fracture or displaced fracture in the cervical spine. No suspicious osseous lesions. Soft tissues and spinal canal: No prevertebral fluid or  swelling. No visible canal hematoma. Subcentimeter nodules in the thyroid. Disc levels: Mild intervertebral disc space narrowing at C4-5 and C5-6. Small disc osteophyte complexes at C4-5 and C5-6 without significant osseous spinal canal stenosis. Facet arthrosis at multiple levels. Foraminal narrowing most pronounced on the right at C4-5 and C5-6. Upper chest: Negative Other: None. IMPRESSION: CT HEAD: 1. No acute intracranial abnormality. 2. Mild chronic microvascular ischemic changes and parenchymal volume loss. CT MAXILLOFACIAL: 1. No evidence of acute maxillofacial fracture. 2. Small hematoma over the left supraorbital ridge extending over the left aspect of the nasal bridge. 3. Mild soft tissue swelling involving the left preseptal soft tissues. 4. Subtle irregularity and medial apex angulation of the right nasal bone without significant overlying soft tissue swelling likely reflecting chronic fracture. CT CERVICAL SPINE: 1. No evidence of acute fracture or traumatic malalignment. 2. Mild multilevel degenerative changes of the cervical spine. Electronically Signed   By: Emily Filbert M.D.   On: 02/26/2024 21:41   CT Chest Wo Contrast Result Date: 02/26/2024 CLINICAL DATA:  Chest trauma, blunt syncopal episode yesterday morning around 0300 while on the toilet EXAM: CT CHEST WITHOUT CONTRAST TECHNIQUE: Multidetector CT imaging of the chest was performed following the standard protocol without IV contrast. RADIATION DOSE REDUCTION: This exam was performed according to the departmental dose-optimization program which includes automated exposure control, adjustment of the mA and/or kV according to patient size and/or use of iterative reconstruction technique. COMPARISON:  CT chest 11/10/2023, CT renal 05/25/2023, CT abdomen pelvis 03/21/2022, chest x-ray 05/30/2021 FINDINGS: Cardiovascular: The thoracic aorta is normal in caliber. The heart is normal in size. No significant pericardial effusion. Moderate  atherosclerotic plaque. At least 3 vessel coronary calcification. Lungs/Pleura: Right lower lobe atelectasis. No focal consolidation. No pulmonary nodule. No pulmonary mass. No pulmonary contusion or laceration. No pneumatocele formation. No pleural effusion. No pneumothorax. No hemothorax. Mediastinum/Nodes: No pneumomediastinum. The central airways are patent. The esophagus is unremarkable.  Small hiatal hernia. The thyroid is unremarkable. Limited evaluation for hilar lymphadenopathy on this noncontrast study. No mediastinal or axillary lymphadenopathy. Musculoskeletal/Chest wall No chest wall mass. No acute rib or sternal fracture. No spinal fracture. Old healed left shoulder fracture. Chronic stable superior endplate T3 and inferior endplate T6 trace concavity suggestive of compression fractures. Upper Abdomen: Splenule noted. Redemonstration of parapelvic fluid dense lesions bilaterally consistent with known parapelvic cysts. Simple renal cysts, in the absence of clinically indicated signs/symptoms, require no independent follow-up. Bilateral 1-2 mm nephrolithiasis. No acute abnormality. IMPRESSION: 1. No acute intrathoracic traumatic injury. 2. No acute fracture or traumatic malalignment of the thoracic spine. 3. Aortic Atherosclerosis (ICD10-I70.0) -including coronary artery calcification. 4. Other imaging findings of potential clinical significance: Bilateral nephrolithiasis. Small hiatal hernia. Electronically Signed   By: Tish Frederickson M.D.   On: 02/26/2024 21:21    ECHO ordered  TELEMETRY reviewed by me 02/27/2024: Sinus rhythm rate 90s  EKG reviewed by me: Normal sinus rhythm  rate 96 bpm  Data reviewed by me 02/27/2024: last 24h vitals tele labs imaging I/O ED provider note, admission H&P  Principal Problem:   Recurrent syncope Active Problems:   Hypertension   Seizure disorder (HCC)   Diabetes mellitus, type 2 (HCC)   Nephrolithiasis   COPD (chronic obstructive pulmonary disease)  (HCC)   Hypothyroidism   Periorbital hematoma of left eye   Hypoxia    ASSESSMENT AND PLAN:  Crystal Foley is a 72 y.o. female  with a past medical history of hypertension, type 2 diabetes, seizure disorder, history of syncope who presented to the ED on 02/26/2024 for multiple syncopal episodes. Cardiology was consulted for further evaluation.   # Syncope # Hypertension # Seizure disorder Patient with a history of prior syncopal episodes and seizure disorder presented to the ED after 2 syncopal episodes yesterday.  Had colonoscopy on Tuesday and while completing prep for this and having BM also had a syncopal episode.  Troponins normal x 2, no evidence of arrhythmia on telemetry thus far. -Will plan for 2-week Holter monitor for further evaluation. -Echo to be done today. -Will resume home losartan and spironolactone. -Continue atorvastatin 40 mg daily. -Neurology to see the patient.  Pending results of echo patient can potentially be discharged today from cardiac perspective.  This patient's plan of care was discussed and created with Dr. Melton Alar and she is in agreement.  Signed: Gale Journey, PA-C  02/27/2024, 10:31 AM [facility]

## 2024-02-27 NOTE — ED Notes (Signed)
 Pt ambulates w/ this RN w/ steady gait to bathroom.

## 2024-02-28 LAB — URINE CULTURE

## 2024-05-03 ENCOUNTER — Other Ambulatory Visit: Payer: Self-pay | Admitting: Urology

## 2024-05-03 ENCOUNTER — Encounter: Payer: Self-pay | Admitting: Podiatry

## 2024-05-03 ENCOUNTER — Ambulatory Visit: Payer: Medicare Other | Admitting: Podiatry

## 2024-05-03 DIAGNOSIS — E119 Type 2 diabetes mellitus without complications: Secondary | ICD-10-CM | POA: Diagnosis not present

## 2024-05-03 DIAGNOSIS — N200 Calculus of kidney: Secondary | ICD-10-CM

## 2024-05-03 DIAGNOSIS — M79609 Pain in unspecified limb: Secondary | ICD-10-CM | POA: Diagnosis not present

## 2024-05-03 DIAGNOSIS — B351 Tinea unguium: Secondary | ICD-10-CM | POA: Diagnosis not present

## 2024-05-03 NOTE — Progress Notes (Signed)
  Subjective:  Patient ID: Crystal Foley, female    DOB: 1951/10/16,  MRN: [id number]  73 y.o. female presents to clinic with  preventative diabetic foot care and painful mycotic toenails of both feet that are difficult to trim. Pain interferes with daily activities and wearing enclosed shoe gear comfortably.  Chief Complaint  Patient presents with   Diabetes    "Trim the nails and file them down."  Dr. Overton Blotter - 03/10/2024;  A1c - 7.2   New problem(s): None   PCP is Jimmy Moulding, MD.  Allergies  Allergen Reactions   Dilantin [Phenytoin Sodium Extended] Swelling   Glipizide Other (See Comments)   Metformin Diarrhea   Phenytoin Swelling    Review of Systems: Negative except as noted in the HPI.   Objective:  Crystal Foley is a pleasant 73 y.o. female WD, WN in NAD. AAO x 3.  Vascular Examination: Vascular status intact b/l with palpable pedal pulses. CFT immediate b/l. No edema. No pain with calf compression b/l. Skin temperature gradient WNL b/l. No varicosities noted. No cyanosis or clubbing noted b/l LE.  Neurological Examination: Sensation grossly intact b/l with 10 gram monofilament.  Dermatological Examination: Pedal skin with normal turgor, texture and tone b/l. Toenails 1-5 b/l thick, discolored, elongated with subungual debris and pain on dorsal palpation. No corns, calluses nor porokeratotic lesions noted.  Musculoskeletal Examination: Muscle strength 5/5 to b/l LE. No pain, crepitus or joint limitation noted with ROM bilateral LE. No gross bony deformities bilaterally.  Radiographs: None  Last A1c:      Latest Ref Rng & Units 02/26/2024   11:41 PM  Hemoglobin A1C  Hemoglobin-A1c 4.8 - 5.6 % 7.2      Assessment:   1. Pain due to onychomycosis of nail   2. Diabetes mellitus without complication (HCC)    Plan:  Consent given for treatment. Patient examined. All patient's and/or POA's questions/concerns addressed on today's visit. Mycotic  toenails 1-5 debrided in length and girth without incident. Continue foot and shoe inspections daily. Monitor blood glucose per PCP/Endocrinologist's recommendations.Continue soft, supportive shoe gear daily. Report any pedal injuries to medical professional. Call office if there are any quesitons/concerns. -Patient/POA to call should there be question/concern in the interim.  Return in about 4 months (around 09/03/2024).  Crystal Foley, DPM      [place] LOCATION: [address], [place] [address]                   Office [phone]   [facility] LOCATION: [address] [place], [place] [address] Office [phone]

## 2024-05-05 ENCOUNTER — Ambulatory Visit
Admission: RE | Admit: 2024-05-05 | Discharge: 2024-05-05 | Disposition: A | Discharge status: Home / Self Care | Source: Ambulatory Visit | Attending: Urology | Admitting: Urology

## 2024-05-05 ENCOUNTER — Encounter: Payer: Self-pay | Admitting: Urology

## 2024-05-05 ENCOUNTER — Ambulatory Visit
Admission: RE | Admit: 2024-05-05 | Discharge: 2024-05-05 | Disposition: A | Discharge status: Home / Self Care | Attending: Urology | Admitting: Urology

## 2024-05-05 ENCOUNTER — Ambulatory Visit (INDEPENDENT_AMBULATORY_CARE_PROVIDER_SITE_OTHER): Payer: Medicare Other | Admitting: Urology

## 2024-05-05 VITALS — BP 122/78 | HR 99 | Ht 64.0 in | Wt 146.0 lb

## 2024-05-05 DIAGNOSIS — N3941 Urge incontinence: Secondary | ICD-10-CM | POA: Diagnosis not present

## 2024-05-05 DIAGNOSIS — N200 Calculus of kidney: Secondary | ICD-10-CM | POA: Insufficient documentation

## 2024-05-05 DIAGNOSIS — R319 Hematuria, unspecified: Secondary | ICD-10-CM | POA: Diagnosis not present

## 2024-05-05 LAB — URINALYSIS, COMPLETE
Bilirubin, UA: NEGATIVE
Leukocytes,UA: NEGATIVE
Nitrite, UA: NEGATIVE
Protein,UA: NEGATIVE
RBC, UA: NEGATIVE
Specific Gravity, UA: 1.015 (ref 1.005–1.030)
Urobilinogen, Ur: 1 mg/dL (ref 0.2–1.0)
pH, UA: 6 (ref 5.0–7.5)

## 2024-05-05 LAB — MICROSCOPIC EXAMINATION

## 2024-05-05 LAB — BLADDER SCAN AMB NON-IMAGING

## 2024-05-05 MED ORDER — ESTRADIOL 0.1 MG/GM VA CREA
TOPICAL_CREAM | VAGINAL | 12 refills | Status: DC
Start: 2024-05-05 — End: 2024-05-19

## 2024-05-05 NOTE — Progress Notes (Signed)
 05/05/2024 10:20 AM   Crystal Foley 1951-02-21 [id number]  Referring provider: Jimmy Moulding, MD [address] [facility] [place],  [place] [address]  Urological history: 1. Nephrolithiasis -stone composition 99% calcium  oxalate monohydrate and 1% calcium  phosphate carbonate -24 hour in 2016 noted extremely low urine volume, borderline hyperoxaluria, high calcium  oxalate stone risk, mild calcium  phosphate stone risk and mild uric acid supersaturation -URS x 1 in 2016 -most recent episode of stone in 04/2020 with the spontaneous passage of a right 3 mm stone -KUB (2024) bilateral nephrolithiasis   2. High risk hematuria -non-smoker -non-contrast CT's x several - nephrolithiasis and bilateral peripelvic cysts  -cysto 2016 NED -secondary to nephrolithiasis  3. Incontinence -at goal by avoiding diet sodas -completed series of PTNS  -Trospium  XL 60 mg daily   4. Renal cysts -RUS 2022 - numerous large parapelvic cysts. Overall appearance of parapelvic cysts and renal configuration is stable as compared with prior CT and ultrasound.  HPI: Crystal Foley is a 73 y.o. female who presents today for yearly follow-up.  Previous records reviewed.     She is having 1-7 daytime voids, 1-2 episodes of nocturia with a mild urge to urinate.  She has urge incontinence.  She leaks once a week.  She wears 1 panty liner daily.  She does limit fluid intake.  She does engage in toilet mapping.  Patient denies any modifying or aggravating factors.  Patient denies any recent UTI's, gross hematuria, dysuria or suprapubic/flank pain.  Patient denies any fevers, chills, nausea or vomiting.    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)    Hematuria, gross    Hydronephrosis    Hypercholesterolemia    Hypertension    Hypothyroidism    Nephrolithiasis    Nephrolithiasis    Seizures (HCC)    last seizure 7-8 yrs ago   Shortness of breath dyspnea    with exertion    Surgical History: Past Surgical History:  Procedure Laterality Date   Bladder tack     COLONOSCOPY     COLONOSCOPY WITH PROPOFOL  N/A 03/25/2018   Procedure: COLONOSCOPY WITH PROPOFOL ;  Surgeon: Toledo, Alphonsus Jeans, MD;  Location: [facility] END[facility]COPY;  Service: Gastroenterology;  Laterality: N/A;   COLONOSCOPY WITH PROPOFOL  N/A 05/13/2018   Procedure: COLONOSCOPY WITH PROPOFOL ;  Surgeon: Toledo, Alphonsus Jeans, MD;  Location: [facility] ENDOSCOPY;  Service: Gastroenterology;  Laterality: N/A;   COLONOSCOPY WITH PROPOFOL  N/A 02/25/2024   Procedure: COLONOSCOPY WITH PROPOFOL ;  Surgeon: Toledo, Alphonsus Jeans, MD;  Location: [facility] ENDOSCOPY;  Service: Gastroenterology;  Laterality: N/A;   CYSTOSCOPY W/ URETERAL STENT PLACEMENT  03/11/15   CYSTOSCOPY WITH RETROGRADE PYELOGRAM, URETEROSCOPY AND STENT PLACEMENT Left 03/17/2015   Procedure: CYSTOSCOPY, STENT REMOVAL, LEFT URETEROSCOPY  WITH STONE REMOVAL WITH BASKET;  Surgeon: Homero Luster, MD;  Location: [facility];  Service: Urology;  Laterality: Left;   POLYPECTOMY  02/25/2024   Procedure: POLYPECTOMY;  Surgeon: Corky Diener, Teodoro K, MD;  Location: [facility] ENDOSCOPY;  Service: Gastroenterology;;   SHOULDER ARTHROSCOPY DISTAL CLAVICLE EXCISION AND OPEN ROTATOR CUFF REPAIR     TUBAL LIGATION      Home Medications:  Allergies as of 05/05/2024       Reactions   Dilantin [phenytoin Sodium Extended] Swelling   Glipizide Other (See Comments)   Metformin Diarrhea   Phenytoin Swelling        Medication  List        Accurate as of May 05, 2024 10:20 AM. If you have any questions, ask your nurse or doctor.          albuterol  108 (90 Base) MCG/ACT inhaler Commonly known as: VENTOLIN  HFA Inhale 1 puff into the lungs every 6 (six) hours as needed for wheezing or shortness of breath.   atorvastatin  40 MG tablet Commonly known as: LIPITOR Take 40 mg by mouth daily.   divalproex  250 MG 24 hr tablet Commonly known as: DEPAKOTE  ER Take 3 tablets (750 mg total) by mouth daily. Take 750 mg at 7 am every day   DSS 100 MG Caps Take by mouth.   estradiol  0.1 MG/GM vaginal cream Commonly known as: ESTRACE  VAGINAL Apply 0.5mg  (pea-sized amount)  just inside the vaginal introitus with a finger-tip on Monday, Wednesday and Friday nights.   Jardiance 10 MG Tabs tablet Generic drug: empagliflozin Take 10 mg by mouth daily.   losartan  100 MG tablet Commonly known as: COZAAR  Take 1 tablet by mouth daily.   Na Sulfate-K Sulfate-Mg Sulfate concentrate 17.5-3.13-1.6 GM/177ML Soln Commonly known as: SUPREP Take 354 mLs by mouth as directed.   Semaglutide 14 MG Tabs Take 14 mg by mouth daily.   spironolactone  25 MG tablet Commonly known as: ALDACTONE  Take 1 tablet by mouth daily.   Trospium  Chloride 60 MG Cp24 TAKE 1 CAPSULE BY MOUTH EVERY DAY        Allergies:  Allergies  Allergen Reactions   Dilantin [Phenytoin Sodium Extended] Swelling   Glipizide Other (See Comments)   Metformin Diarrhea   Phenytoin Swelling    Family History: Family History  Problem Relation Age of Onset   Coronary artery disease Sister    Heart disease Sister    Brain cancer Father    Emphysema Mother     Social History:  reports that she has never smoked. She has never used smokeless tobacco. She reports that she does not drink alcohol and does not use drugs.  ROS: Pertinent ROS in HPI  Physical Exam: BP 122/78   Pulse 99   Ht 5\' 4"  (1.626 m)   Wt 146 lb (66.2 kg)   BMI 25.06 kg/m    Constitutional:  Well nourished. Alert and oriented, No acute distress. HEENT: Gray AT, moist mucus membranes.  Trachea midline Cardiovascular: No clubbing, cyanosis, or edema. Respiratory: Normal respiratory effort, no increased work of breathing. Neurologic: Grossly intact, no focal deficits, moving all 4 extremities. Psychiatric: Normal mood and affect.    Laboratory Data: Contains abnormal data Comprehensive Metabolic Panel (CMP) Order: [id number] Component Ref Range & Units 2 mo ago  Glucose 70 - 110 mg/dL 409 High   Sodium 811 - 145 mmol/L 138  Potassium 3.6 - 5.1 mmol/L 4.3  Chloride 97 - 109 mmol/L 102  Carbon Dioxide (CO2) 22.0 - 32.0 mmol/L 29.2  Urea Nitrogen (BUN) 7 - 25 mg/dL 11  Creatinine 0.6 - 1.1 mg/dL 0.6  Glomerular Filtration Rate (eGFR) >60 mL/min/1.73sq m 95  Comment: CKD-EPI (2021) does not include patient's  race in the calculation of eGFR.  Monitoring changes of plasma creatinine and eGFR over time is useful for monitoring kidney function.  Interpretive Ranges for eGFR (CKD-EPI 2021):  eGFR:       >60 mL/min/1.73 sq. m - Normal eGFR:       30-59 mL/min/1.73 sq. m - Moderately Decreased eGFR:       15-29 mL/min/1.73 sq. m  - Severely Decreased eGFR:       < 15 mL/min/1.73 sq. m  - Kidney Failure   Note: These eGFR calculations do not apply in acute situations when eGFR is changing rapidly or patients on dialysis.  Calcium  8.7 - 10.3 mg/dL 8.9  AST 8 - 39 U/L 15  ALT 5 - 38 U/L 13  Alk Phos (alkaline Phosphatase) 34 - 104 U/L 53  Albumin 3.5 - 4.8 g/dL 3.9  Bilirubin, Total 0.3 - 1.2 mg/dL 0.5  Protein, Total 6.1 - 7.9 g/dL 6.7  A/G Ratio 1.0 - 5.0 gm/dL 1.4  Resulting Agency [facility]   Specimen Collected: 03/03/24 07:19   Performed by: [facility] Last Resulted: 03/03/24 09:56  Received From: [facility]  Result Received: 03/06/24 16:39    Contains abnormal data Hemoglobin A1C Order:  [id number] Component Ref Range & Units 2 mo ago  Hemoglobin A1C 4.2 - 5.6 % 7.2 High   Average Blood Glucose (Calc) mg/dL 409  Resulting Agency [facility]  Narrative Performed by [facility] Normal Range:    4.2 - 5.6% Increased Risk:  5.7 - 6.4% Diabetes:        >= 6.5% Glycemic Control for adults with diabetes:  <7%    Specimen Collected: 03/03/24 07:19   Performed by: [facility] Last Resulted: 03/03/24 08:39  Received From: [facility]  Result Received: 03/06/24 16:39  I have reviewed the labs.   Pertinent Imaging:  Results for orders placed or performed in visit on 05/05/24  BLADDER SCAN AMB NON-IMAGING   Collection Time: 05/05/24  9:23 AM  Result Value Ref Range   Scan Result 17ml     KUB no stones, radiologist interpretation still pending I have independently reviewed the films.  See HPI.    Assessment & Plan:    1. Nephrolithiasis - KUB did not demonstrate nephrolithiasis   2. High risk hematuria -likely secondary to nephrolithiasis -several RUS and non-contrast CT - positive for stone -cysto 2016 NED -no reports of gross heme -UA w/o micro heme  3.  Incontinence - At goal - Continue trospium  ER 60 mg daily and estrogen vaginal cream 3 nights weekly   Return in about 1 year (around 05/05/2025) for KUB, UA, OAB questionnaire and PVR .  These notes generated with voice recognition software. I apologize for typographical errors.  Briant Camper  [facility] [address]  [address] [place], [place] [address] [phone]

## 2024-05-06 ENCOUNTER — Ambulatory Visit: Payer: Medicare Other | Admitting: Urology

## 2024-05-19 ENCOUNTER — Other Ambulatory Visit: Payer: Self-pay

## 2024-05-19 NOTE — Telephone Encounter (Signed)
 Patient says she is not using the estrace  cream she is using clotrimozole

## 2024-05-20 MED ORDER — CLOTRIMAZOLE-BETAMETHASONE 1-0.05 % EX CREA: 1.0000 | TOPICAL_CREAM | Freq: Two times a day (BID) | CUTANEOUS | 0 refills | Status: AC

## 2024-08-24 ENCOUNTER — Other Ambulatory Visit: Payer: Self-pay | Admitting: Sports Medicine

## 2024-08-24 DIAGNOSIS — M7061 Trochanteric bursitis, right hip: Secondary | ICD-10-CM

## 2024-08-24 DIAGNOSIS — M76891 Other specified enthesopathies of right lower limb, excluding foot: Secondary | ICD-10-CM

## 2024-08-24 DIAGNOSIS — G8929 Other chronic pain: Secondary | ICD-10-CM

## 2024-08-24 DIAGNOSIS — M24151 Other articular cartilage disorders, right hip: Secondary | ICD-10-CM

## 2024-08-24 DIAGNOSIS — M1611 Unilateral primary osteoarthritis, right hip: Secondary | ICD-10-CM

## 2024-08-25 ENCOUNTER — Ambulatory Visit
Admission: RE | Admit: 2024-08-25 | Discharge: 2024-08-25 | Disposition: A | Discharge status: Home / Self Care | Source: Ambulatory Visit | Attending: Sports Medicine | Admitting: Sports Medicine

## 2024-08-25 DIAGNOSIS — M7061 Trochanteric bursitis, right hip: Secondary | ICD-10-CM | POA: Diagnosis present

## 2024-08-25 DIAGNOSIS — M24151 Other articular cartilage disorders, right hip: Secondary | ICD-10-CM | POA: Diagnosis present

## 2024-08-25 DIAGNOSIS — G8929 Other chronic pain: Secondary | ICD-10-CM | POA: Diagnosis present

## 2024-08-25 DIAGNOSIS — M1611 Unilateral primary osteoarthritis, right hip: Secondary | ICD-10-CM | POA: Insufficient documentation

## 2024-08-25 DIAGNOSIS — M25551 Pain in right hip: Secondary | ICD-10-CM | POA: Diagnosis present

## 2024-08-25 DIAGNOSIS — M76891 Other specified enthesopathies of right lower limb, excluding foot: Secondary | ICD-10-CM | POA: Insufficient documentation

## 2024-08-28 ENCOUNTER — Other Ambulatory Visit

## 2024-09-03 ENCOUNTER — Ambulatory Visit (INDEPENDENT_AMBULATORY_CARE_PROVIDER_SITE_OTHER): Admitting: Podiatry

## 2024-09-03 ENCOUNTER — Encounter: Payer: Self-pay | Admitting: Podiatry

## 2024-09-03 DIAGNOSIS — M79609 Pain in unspecified limb: Secondary | ICD-10-CM | POA: Diagnosis not present

## 2024-09-03 DIAGNOSIS — M2011 Hallux valgus (acquired), right foot: Secondary | ICD-10-CM | POA: Diagnosis not present

## 2024-09-03 DIAGNOSIS — B351 Tinea unguium: Secondary | ICD-10-CM

## 2024-09-03 DIAGNOSIS — M2012 Hallux valgus (acquired), left foot: Secondary | ICD-10-CM

## 2024-09-03 DIAGNOSIS — E119 Type 2 diabetes mellitus without complications: Secondary | ICD-10-CM

## 2024-09-03 NOTE — Progress Notes (Signed)
  Subjective:  Patient ID: Crystal Foley, female    DOB: 11-27-1951,  MRN: [id number]  Crystal Foley presents to clinic today for for annual diabetic foot examination and painful thick toenails that are difficult to trim. Pain interferes with ambulation. Aggravating factors include wearing enclosed shoe gear. Pain is relieved with periodic professional debridement.   New problem(s): None.   PCP is Lenon Layman ORN, MD. ARNETTA 03/10/2024.  Allergies  Allergen Reactions   Dilantin [Phenytoin Sodium Extended] Swelling   Glipizide Other (See Comments)   Metformin Diarrhea   Phenytoin Swelling    Review of Systems: Negative except as noted in the HPI.  Objective: No changes noted in today's physical examination. There were no vitals filed for this visit. Makalya E Foley is a pleasant 73 y.o. female WD, WN in NAD. AAO x 3.   Diabetic foot exam was performed with the following findings:   Vascular Examination: Capillary refill time immediate b/l. Palpable pedal pulses. Pedal hair present b/l. Pedal edema absent. No pain with calf compression b/l. Skin temperature gradient WNL b/l. No cyanosis or clubbing b/l. No ischemia or gangrene noted b/l.   Neurological Examination: Sensation grossly intact b/l with 10 gram monofilament. Vibratory sensation intact b/l.   Dermatological Examination: Pedal skin with normal turgor, texture and tone b/l.  No open wounds. No interdigital macerations.   Toenails 1-5 b/l thick, discolored, elongated with subungual debris and pain on dorsal palpation.   No hyperkeratotic nor porokeratotic lesions.  Musculoskeletal Examination: Muscle strength 5/5 to all lower extremity muscle groups bilaterally. HAV with bunion deformity noted b/l LE.  Radiographs: None     Assessment/Plan: 1. Pain due to onychomycosis of nail   2. Hallux valgus, acquired, bilateral   3. Diabetes mellitus without complication (HCC)   4. Encounter for diabetic foot exam (HCC)      Diabetic foot examination performed today. All patient's and/or POA's questions/concerns addressed on today's visit. Toenails 1-5 debrided in length and girth without incident. Continue foot and shoe inspections daily. Monitor blood glucose per PCP/Endocrinologist's recommendations. Continue soft, supportive shoe gear daily. Report any pedal injuries to medical professional. Call office if there are any questions/concerns. -Patient/POA to call should there be question/concern in the interim.   Return in about 3 months (around 12/03/2024).  Delon LITTIE Merlin, DPM      [place] LOCATION: [address], [place] [address]                   Office [phone]   [facility] LOCATION: [address] [place], [place] [address] Office [phone]

## 2024-09-06 ENCOUNTER — Encounter: Payer: Self-pay | Admitting: Podiatry

## 2024-09-28 ENCOUNTER — Other Ambulatory Visit: Payer: Self-pay | Admitting: Orthopedic Surgery

## 2024-10-14 NOTE — Patient Instructions (Addendum)
 Your procedure is scheduled on:10-25-24 Monday Report to the Registration Desk on the 1st floor of the [facility].Then proceed to the 2nd floor Surgery Desk To find out your arrival time, please call [phone] between 1PM - 3PM on:10-22-24 Friday If your arrival time is 6:00 am, do not arrive before that time as the [facility] entrance doors do not open until 6:00 am.  REMEMBER: Instructions that are not followed completely may result in serious medical risk, up to and including death; or upon the discretion of your surgeon and anesthesiologist your surgery may need to be rescheduled.  Do not eat food after midnight the night before surgery.  No gum chewing or hard candies.  You may however, drink Water up to 2 hours before you are scheduled to arrive for your surgery. Do not drink anything within 2 hours of your scheduled arrival time.  In addition, your doctor has ordered for you to drink the provided:  Gatorade G2 Drinking this carbohydrate drink up to two hours before surgery helps to reduce insulin  resistance and improve patient outcomes. Please complete drinking 2 hours before scheduled arrival time.  One week prior to surgery:Last dose will be on 10-17-24  Stop Anti-inflammatories (NSAIDS) such as Advil , Aleve, Ibuprofen , Motrin , Naproxen, Naprosyn and Aspirin based products such as Excedrin, Goody's Powder, BC Powder. Stop ANY OVER THE COUNTER supplements until after surgery.  You may however, continue to take Tylenol  if needed for pain up until the day of surgery.  Stop  Rybelsis (Semaglutide) 1 day prior to surgery-Last dose will be on 10-23-24 Saturday  Stop JARDIANCE 3 days prior to surgery-Last dose will be on 10-21-24 Thursday  Continue taking all of your other prescription medications up until the day of surgery.  ON THE DAY OF SURGERY ONLY TAKE THESE MEDICATIONS WITH SIPS OF WATER: -divalproex  (DEPAKOTE  ER)  -Trospium  Chloride (SANCTURA )  Bring your Albuterol   Inhaler to the hospital  No Alcohol for 24 hours before or after surgery.  No Smoking including e-cigarettes for 24 hours before surgery.  No chewable tobacco products for at least 6 hours before surgery.  No nicotine patches on the day of surgery.  Do not use any recreational drugs for at least a week (preferably 2 weeks) before your surgery.  Please be advised that the combination of cocaine and anesthesia may have negative outcomes, up to and including death. If you test positive for cocaine, your surgery will be cancelled.  On the morning of surgery brush your teeth with toothpaste and water, you may rinse your mouth with mouthwash if you wish. Do not swallow any toothpaste or mouthwash.  Use CHG Soap as directed on instruction sheet.  Do not wear jewelry, make-up, hairpins, clips or nail polish.  For welded (permanent) jewelry: bracelets, anklets, waist bands, etc.  Please have this removed prior to surgery.  If it is not removed, there is a chance that hospital personnel will need to cut it off on the day of surgery.  Do not wear lotions, powders, or perfumes.   Do not shave body hair from the neck down 48 hours before surgery.  Contact lenses, hearing aids and dentures may not be worn into surgery.  Do not bring valuables to the hospital. [facility] is not responsible for any missing/lost belongings or valuables.   Notify your doctor if there is any change in your medical condition (cold, fever, infection).  Wear comfortable clothing (specific to your surgery type) to the hospital.  After surgery,  you can help prevent lung complications by doing breathing exercises.  Take deep breaths and cough every 1-2 hours. Your doctor may order a device called an Incentive Spirometer to help you take deep breaths. When coughing or sneezing, hold a pillow firmly against your incision with both hands. This is called "splinting." Doing this helps protect your incision. It also decreases  belly discomfort.  If you are being admitted to the hospital overnight, leave your suitcase in the car. After surgery it may be brought to your room.  In case of increased patient census, it may be necessary for you, the patient, to continue your postoperative care in the Same Day Surgery department.  If you are being discharged the day of surgery, you will not be allowed to drive home. You will need a responsible individual to drive you home and stay with you for 24 hours after surgery.   If you are taking public transportation, you will need to have a responsible individual with you.  Please call the Pre-admissions Testing Dept. at [phone] if you have any questions about these instructions.  Surgery Visitation Policy:  Patients having surgery or a procedure may have two visitors.  Children under the age of 42 must have an adult with them who is not the patient.  Inpatient Visitation:    Visiting hours are 7 a.m. to 8 p.m. Up to four visitors are allowed at one time in a patient room. The visitors may rotate out with other people during the day.  One visitor age 71 or older may stay with the patient overnight and must be in the room by 8 p.m.    Pre-operative 4 CHG Bath Instructions   You can play a key role in reducing the risk of infection after surgery. Your skin needs to be as free of germs as possible. You can reduce the number of germs on your skin by washing with CHG (chlorhexidine  gluconate) soap before surgery. CHG is an antiseptic soap that kills germs and continues to kill germs even after washing.   DO NOT use if you have an allergy to chlorhexidine /CHG or antibacterial soaps. If your skin becomes reddened or irritated, stop using the CHG and notify one of our RNs at [phone].   Please shower with the CHG soap starting 4 days before surgery using the following schedule:     Please keep in mind the following:  DO NOT shave, including legs and underarms,  starting the day of your first shower.   You may shave your face at any point before/day of surgery.  Place clean sheets on your bed the day you start using CHG soap. Use a clean washcloth (not used since being washed) for each shower. DO NOT sleep with pets once you start using the CHG.   CHG Shower Instructions:  If you choose to wash your hair and private area, wash first with your normal shampoo/soap.  After you use shampoo/soap, rinse your hair and body thoroughly to remove shampoo/soap residue.  Turn the water OFF and apply about 3 tablespoons (45 ml) of CHG soap to a CLEAN washcloth.  Apply CHG soap ONLY FROM YOUR NECK DOWN TO YOUR TOES (washing for 3-5 minutes)  DO NOT use CHG soap on face, private areas, open wounds, or sores.  Pay special attention to the area where your surgery is being performed.  If you are having back surgery, having someone wash your back for you may be helpful. Wait 2 minutes after CHG  soap is applied, then you may rinse off the CHG soap.  Pat dry with a clean towel  Put on clean clothes/pajamas   If you choose to wear lotion, please use ONLY the CHG-compatible lotions on the back of this paper.     Additional instructions for the day of surgery: DO NOT APPLY any lotions, deodorants, cologne, or perfumes.   Put on clean/comfortable clothes.  Brush your teeth.  Ask your nurse before applying any prescription medications to the skin.      CHG Compatible Lotions   Aveeno Moisturizing lotion  Cetaphil Moisturizing Cream  Cetaphil Moisturizing Lotion  Clairol Herbal Essence Moisturizing Lotion, Dry Skin  Clairol Herbal Essence Moisturizing Lotion, Extra Dry Skin  Clairol Herbal Essence Moisturizing Lotion, Normal Skin  Curel Age Defying Therapeutic Moisturizing Lotion with Alpha Hydroxy  Curel Extreme Care Body Lotion  Curel Soothing Hands Moisturizing Hand Lotion  Curel Therapeutic Moisturizing Cream, Fragrance-Free  Curel Therapeutic Moisturizing  Lotion, Fragrance-Free  Curel Therapeutic Moisturizing Lotion, Original Formula  Eucerin Daily Replenishing Lotion  Eucerin Dry Skin Therapy Plus Alpha Hydroxy Crme  Eucerin Dry Skin Therapy Plus Alpha Hydroxy Lotion  Eucerin Original Crme  Eucerin Original Lotion  Eucerin Plus Crme Eucerin Plus Lotion  Eucerin TriLipid Replenishing Lotion  Keri Anti-Bacterial Hand Lotion  Keri Deep Conditioning Original Lotion Dry Skin Formula Softly Scented  Keri Deep Conditioning Original Lotion, Fragrance Free Sensitive Skin Formula  Keri Lotion Fast Absorbing Fragrance Free Sensitive Skin Formula  Keri Lotion Fast Absorbing Softly Scented Dry Skin Formula  Keri Original Lotion  Keri Skin Renewal Lotion Keri Silky Smooth Lotion  Keri Silky Smooth Sensitive Skin Lotion  Nivea Body Creamy Conditioning Oil  Nivea Body Extra Enriched Lotion  Nivea Body Original Lotion  Nivea Body Sheer Moisturizing Lotion Nivea Crme  Nivea Skin Firming Lotion  NutraDerm 30 Skin Lotion  NutraDerm Skin Lotion  NutraDerm Therapeutic Skin Cream  NutraDerm Therapeutic Skin Lotion  ProShield Protective Hand Cream  Provon moisturizing lotion  How to Use an Incentive Spirometer An incentive spirometer is a tool that measures how well you are filling your lungs with each breath. Learning to take long, deep breaths using this tool can help you keep your lungs clear and active. This may help to reverse or lessen your chance of developing breathing (pulmonary) problems, especially infection. You may be asked to use a spirometer: After a surgery. If you have a lung problem or a history of smoking. After a long period of time when you have been unable to move or be active. If the spirometer includes an indicator to show the highest number that you have reached, your health care provider or respiratory therapist will help you set a goal. Keep a log of your progress as told by your health care provider. What are the  risks? Breathing too quickly may cause dizziness or cause you to pass out. Take your time so you do not get dizzy or light-headed. If you are in pain, you may need to take pain medicine before doing incentive spirometry. It is harder to take a deep breath if you are having pain. How to use your incentive spirometer  Sit up on the edge of your bed or on a chair. Hold the incentive spirometer so that it is in an upright position. Before you use the spirometer, breathe out normally. Place the mouthpiece in your mouth. Make sure your lips are closed tightly around it. Breathe in slowly and as deeply as you can  through your mouth, causing the piston or the ball to rise toward the top of the chamber. Hold your breath for 3-5 seconds, or for as long as possible. If the spirometer includes a coach indicator, use this to guide you in breathing. Slow down your breathing if the indicator goes above the marked areas. Remove the mouthpiece from your mouth and breathe out normally. The piston or ball will return to the bottom of the chamber. Rest for a few seconds, then repeat the steps 10 or more times. Take your time and take a few normal breaths between deep breaths so that you do not get dizzy or light-headed. Do this every 1-2 hours when you are awake. If the spirometer includes a goal marker to show the highest number you have reached (best effort), use this as a goal to work toward during each repetition. After each set of 10 deep breaths, cough a few times. This will help to make sure that your lungs are clear. If you have an incision on your chest or abdomen from surgery, place a pillow or a rolled-up towel firmly against the incision when you cough. This can help to reduce pain while taking deep breaths and coughing. General tips When you are able to get out of bed: Walk around often. Continue to take deep breaths and cough in order to clear your lungs. Keep using the incentive spirometer until  your health care provider says it is okay to stop using it. If you have been in the hospital, you may be told to keep using the spirometer at home. Contact a health care provider if: You are having difficulty using the spirometer. You have trouble using the spirometer as often as instructed. Your pain medicine is not giving enough relief for you to use the spirometer as told. You have a fever. Get help right away if: You develop shortness of breath. You develop a cough with bloody mucus from the lungs. You have fluid or blood coming from an incision site after you cough. Summary An incentive spirometer is a tool that can help you learn to take long, deep breaths to keep your lungs clear and active. You may be asked to use a spirometer after a surgery, if you have a lung problem or a history of smoking, or if you have been inactive for a long period of time. Use your incentive spirometer as instructed every 1-2 hours while you are awake. If you have an incision on your chest or abdomen, place a pillow or a rolled-up towel firmly against your incision when you cough. This will help to reduce pain. Get help right away if you have shortness of breath, you cough up bloody mucus, or blood comes from your incision when you cough. This information is not intended to replace advice given to you by your health care provider. Make sure you discuss any questions you have with your health care provider. Document Revised: 10/10/2023 Document Reviewed: 10/10/2023 Elsevier Patient Education  2024 Elsevier Inc.    Preoperative Educational Videos for Total Hip, Knee and Shoulder Replacements  To better prepare for surgery, please view our videos that explain the physical activity and discharge planning required to have the best surgical recovery at [facility].  [email or web address]  Questions? Call [phone] or email  [email or web address]        Community Resource Directory to address health-related social needs:  [email or web address]

## 2024-10-15 ENCOUNTER — Encounter
Admission: RE | Admit: 2024-10-15 | Discharge: 2024-10-15 | Disposition: A | Discharge status: Home / Self Care | Source: Ambulatory Visit | Attending: Orthopedic Surgery | Admitting: Orthopedic Surgery

## 2024-10-15 ENCOUNTER — Other Ambulatory Visit: Payer: Self-pay

## 2024-10-15 VITALS — BP 145/83 | HR 88 | Resp 14 | Ht 64.0 in | Wt 156.5 lb

## 2024-10-15 DIAGNOSIS — R8271 Bacteriuria: Secondary | ICD-10-CM | POA: Diagnosis not present

## 2024-10-15 DIAGNOSIS — Z01818 Encounter for other preprocedural examination: Secondary | ICD-10-CM

## 2024-10-15 DIAGNOSIS — M1611 Unilateral primary osteoarthritis, right hip: Secondary | ICD-10-CM

## 2024-10-15 DIAGNOSIS — R8281 Pyuria: Secondary | ICD-10-CM | POA: Insufficient documentation

## 2024-10-15 DIAGNOSIS — Z01812 Encounter for preprocedural laboratory examination: Secondary | ICD-10-CM | POA: Diagnosis present

## 2024-10-15 DIAGNOSIS — E119 Type 2 diabetes mellitus without complications: Secondary | ICD-10-CM | POA: Insufficient documentation

## 2024-10-15 DIAGNOSIS — R8290 Unspecified abnormal findings in urine: Secondary | ICD-10-CM | POA: Insufficient documentation

## 2024-10-15 HISTORY — DX: Atherosclerosis of aorta: I70.0

## 2024-10-15 HISTORY — DX: Type 2 diabetes mellitus with other specified complication: E11.69

## 2024-10-15 HISTORY — DX: Unilateral primary osteoarthritis, right hip: M16.11

## 2024-10-15 HISTORY — DX: Chronic kidney disease, stage 2 (mild): N18.2

## 2024-10-15 HISTORY — DX: Personal history of other infectious and parasitic diseases: Z86.19

## 2024-10-15 HISTORY — DX: Anemia, unspecified: D64.9

## 2024-10-15 HISTORY — DX: Obesity, unspecified: E66.9

## 2024-10-15 HISTORY — DX: Sleep apnea, unspecified: G47.30

## 2024-10-15 LAB — COMPREHENSIVE METABOLIC PANEL WITH GFR
ALT: 11 U/L (ref 0–44)
AST: 18 U/L (ref 15–41)
Albumin: 3.5 g/dL (ref 3.5–5.0)
Alkaline Phosphatase: 103 U/L (ref 38–126)
Anion gap: 17 — ABNORMAL HIGH (ref 5–15)
BUN: 13 mg/dL (ref 8–23)
CO2: 25 mmol/L (ref 22–32)
Calcium: 8.7 mg/dL — ABNORMAL LOW (ref 8.9–10.3)
Chloride: 96 mmol/L — ABNORMAL LOW (ref 98–111)
Creatinine, Ser: 0.55 mg/dL (ref 0.44–1.00)
GFR, Estimated: >60 mL/min (ref >60)
Glucose, Bld: 172 mg/dL — ABNORMAL HIGH (ref 70–99)
Potassium: 3.2 mmol/L — ABNORMAL LOW (ref 3.5–5.1)
Sodium: 138 mmol/L (ref 135–145)
Total Bilirubin: 0.4 mg/dL (ref 0.0–1.2)
Total Protein: 7.8 g/dL (ref 6.5–8.1)

## 2024-10-15 LAB — URINALYSIS, ROUTINE W REFLEX MICROSCOPIC
Bilirubin Urine: NEGATIVE
Glucose, UA: >=500 mg/dL — AB
Hgb urine dipstick: NEGATIVE
Ketones, ur: 5 mg/dL — AB
Nitrite: NEGATIVE
Protein, ur: NEGATIVE mg/dL
Specific Gravity, Urine: 1.02 (ref 1.005–1.030)
pH: 5 (ref 5.0–8.0)

## 2024-10-15 LAB — CBC WITH DIFFERENTIAL/PLATELET
Abs Immature Granulocytes: 0.03 K/uL (ref 0.00–0.07)
Basophils Absolute: 0 K/uL (ref 0.0–0.1)
Basophils Relative: 0 %
Eosinophils Absolute: 0 K/uL (ref 0.0–0.5)
Eosinophils Relative: 0 %
HCT: 44.7 % (ref 36.0–46.0)
Hemoglobin: 14.8 g/dL (ref 12.0–15.0)
Immature Granulocytes: 1 %
Lymphocytes Relative: 33 %
Lymphs Abs: 1.6 K/uL (ref 0.7–4.0)
MCH: 30.6 pg (ref 26.0–34.0)
MCHC: 33.1 g/dL (ref 30.0–36.0)
MCV: 92.4 fL (ref 80.0–100.0)
Monocytes Absolute: 0.5 K/uL (ref 0.1–1.0)
Monocytes Relative: 11 %
Neutro Abs: 2.6 K/uL (ref 1.7–7.7)
Neutrophils Relative %: 55 %
Platelets: 237 K/uL (ref 150–400)
RBC: 4.84 MIL/uL (ref 3.87–5.11)
RDW: 12.9 % (ref 11.5–15.5)
WBC: 4.9 K/uL (ref 4.0–10.5)
nRBC: 0 % (ref 0.0–0.2)

## 2024-10-15 LAB — TYPE AND SCREEN
ABO/RH(D): O POS
Antibody Screen: NEGATIVE

## 2024-10-16 LAB — SURGICAL PCR SCREEN
MRSA, PCR: NEGATIVE
Staphylococcus aureus: POSITIVE — AB

## 2024-10-16 LAB — URINE CULTURE: Culture: NO GROWTH

## 2024-10-19 ENCOUNTER — Encounter: Payer: Self-pay | Admitting: Orthopedic Surgery

## 2024-10-19 ENCOUNTER — Telehealth: Payer: Self-pay | Admitting: Urgent Care

## 2024-10-19 DIAGNOSIS — Z01812 Encounter for preprocedural laboratory examination: Secondary | ICD-10-CM

## 2024-10-19 DIAGNOSIS — E876 Hypokalemia: Secondary | ICD-10-CM

## 2024-10-19 MED ORDER — POTASSIUM CHLORIDE CRYS ER 10 MEQ PO TBCR
EXTENDED_RELEASE_TABLET | ORAL | 0 refills | Status: AC
Start: 2024-10-19 — End: ?

## 2024-10-19 NOTE — Progress Notes (Addendum)
 [facility] Perioperative Services: Pre-Admission/Anesthesia Testing  Abnormal Lab Notification and Treatment Plan of Care   Date: 10/19/24  Name: Crystal Foley DOB: 09/30/1951 MRN:   [id number]  Re: Abnormal labs noted during PAT appointment   Notified:  Provider Name Provider Role Notification Mode  Lorelle Hussar, MD Orthopedics (Surgeon) Routed and/or faxed via RANELL Lenon Layman LELON, MD Primary Care Provider Routed and/or faxed via [facility]   Clinical Information and Notes:  ABNORMAL LAB VALUE(S): Lab Results  Component Value Date   K 3.2 (L) 10/15/2024   Crystal Foley is scheduled for an elective ARTHROPLASTY, HIP, TOTAL, ANTERIOR APPROACH on 10/25/2024. In review of her medication reconciliation, it is noted that the patient is taking prescribed diuretic medications (spironolactone  25 mg) daily.   Please note, in efforts to promote a safe and effective anesthetic course, per current guidelines/standards set by the [facility] anesthesia team, the minimal acceptable K+ level for the patient to proceed with general anesthesia is 3.0 mmol/L. With that being said, if the patient drops any lower, her elective procedure will need to be postponed until K+ is better optimized. In efforts to prevent case cancellation, and ultimately to promote the safety of this patient undergoing sedation/anesthesia, will make efforts to optimize pre-surgical K+ level allowing the surgical intervention to proceed as planned.    Impression and Plan:  Crystal Foley found to be HYPOkalemic at 3.2 mmol/L on preoperative labs.   She is on K+ sparing diuretic therapy. Discussed that this is likely a nutritional mediated derangement in the absence of GI related symptoms; (no diarrhea).  Due to insufficient sample, lab unable to add on magnesium level.  Patient denies regular use of laxative medications. Reviewed decreased intake of dietary K+ and other potentials for increased insensible  losses. Reviewed plans for short term preoperative optimization as follows:   Meds ordered this encounter  Medications   potassium chloride  (KLOR-CON  M) 10 MEQ tablet    Sig: Take 1 tablet (10 mEq) daily until surgery.  Be sure to take dose on day of surgery.  Follow-up with PCP for repeat labs.    Dispense:  7 tablet    Refill:  0    Please contact the patient as soon as it is available for pickup. Rx is for preoperative K+ optimization and needs to be started ASAP.   Encouraged patient to follow up with PCP about 2-3 weeks postoperatively to have labs rechecked to ensure that levels are remaining within normal range. Discussed nutritional intake of K+ rich foods as an adjunctive way to keep her K+ levels normal; list of K+ rich foods provided. Also mentioned ORS, however advised her not to rely solely on these drinks, as they are high in Na+, and she has a HTN diagnosis.   Will send copy of this note to surgeon and PCP to make them aware of K+ level and plans for correction. Discussed that PCP may elect to add a daily K+ supplement if levels remain low on recheck.  Would need magnesium level prior to initiating. Patient is on a K+ sparing diuretic, thus her K+ level will need to be monitored more frequently.  Order entered to recheck K+ on the day of her surgery to ensure optimization. Wished patient the best of luck with her upcoming surgery and subsequent recovery. She was encouraged to return call to the PAT clinic, or to her surgeon's office, should any questions or concerns arise between now and the time of  her surgery. Patient was appreciative of the care/concern expressed by PAT staff.   Encounter Diagnoses  Name Primary?   Pre-operative laboratory examination Yes   Hypokalemia    Dorise Pereyra, MSN, APRN, FNP-C, CEN [facility]  Perioperative Services Nurse Practitioner Phone: [phone] 10/19/24 1:04 PM  NOTE: This note has been prepared using Dragon dictation  software. Despite my best ability to proofread, there is always the potential that unintentional transcriptional errors may still occur from this process.

## 2024-10-21 ENCOUNTER — Other Ambulatory Visit: Payer: Self-pay

## 2024-10-21 DIAGNOSIS — N3946 Mixed incontinence: Secondary | ICD-10-CM

## 2024-10-24 MED ORDER — TRANEXAMIC ACID-NACL 1000-0.7 MG/100ML-% IV SOLN
1000.0000 mg | INTRAVENOUS | Status: AC
  Administered 2024-10-25 (×2): 1000 mg via INTRAVENOUS

## 2024-10-24 MED ORDER — CHLORHEXIDINE GLUCONATE 0.12 % MT SOLN
15.0000 mL | Freq: Once | OROMUCOSAL | Status: AC
  Administered 2024-10-25: 15 mL via OROMUCOSAL

## 2024-10-24 MED ORDER — ORAL CARE MOUTH RINSE: 15.0000 mL | Freq: Once | OROMUCOSAL | Status: AC

## 2024-10-24 MED ORDER — CEFAZOLIN SODIUM-DEXTROSE 2-4 GM/100ML-% IV SOLN
2.0000 g | INTRAVENOUS | Status: AC
  Administered 2024-10-25: 2 g via INTRAVENOUS

## 2024-10-24 MED ORDER — SODIUM CHLORIDE 0.9 % IV SOLN: INTRAVENOUS | Status: DC

## 2024-10-24 MED ORDER — DEXAMETHASONE SOD PHOSPHATE PF 10 MG/ML IJ SOLN
8.0000 mg | Freq: Once | INTRAMUSCULAR | Status: AC
  Administered 2024-10-25: 10 mg via INTRAVENOUS

## 2024-10-25 ENCOUNTER — Ambulatory Visit: Payer: Self-pay | Admitting: Urgent Care

## 2024-10-25 ENCOUNTER — Other Ambulatory Visit: Payer: Self-pay

## 2024-10-25 ENCOUNTER — Encounter: Payer: Self-pay | Admitting: Orthopedic Surgery

## 2024-10-25 ENCOUNTER — Encounter: Admission: RE | Disposition: A | Payer: Self-pay | Source: Ambulatory Visit | Attending: Orthopedic Surgery

## 2024-10-25 ENCOUNTER — Ambulatory Visit
Admission: RE | Admit: 2024-10-25 | Discharge: 2024-10-26 | Disposition: A | Discharge status: Home Health | Source: Ambulatory Visit | Attending: Orthopedic Surgery | Admitting: Orthopedic Surgery

## 2024-10-25 ENCOUNTER — Ambulatory Visit

## 2024-10-25 DIAGNOSIS — M1611 Unilateral primary osteoarthritis, right hip: Secondary | ICD-10-CM | POA: Diagnosis present

## 2024-10-25 DIAGNOSIS — Z825 Family history of asthma and other chronic lower respiratory diseases: Secondary | ICD-10-CM | POA: Insufficient documentation

## 2024-10-25 DIAGNOSIS — Z7984 Long term (current) use of oral hypoglycemic drugs: Secondary | ICD-10-CM | POA: Insufficient documentation

## 2024-10-25 DIAGNOSIS — N182 Chronic kidney disease, stage 2 (mild): Secondary | ICD-10-CM | POA: Insufficient documentation

## 2024-10-25 DIAGNOSIS — Z96641 Presence of right artificial hip joint: Secondary | ICD-10-CM | POA: Diagnosis present

## 2024-10-25 DIAGNOSIS — E876 Hypokalemia: Secondary | ICD-10-CM

## 2024-10-25 DIAGNOSIS — M769 Unspecified enthesopathy, lower limb, excluding foot: Secondary | ICD-10-CM | POA: Diagnosis not present

## 2024-10-25 DIAGNOSIS — E119 Type 2 diabetes mellitus without complications: Secondary | ICD-10-CM

## 2024-10-25 DIAGNOSIS — Z79899 Other long term (current) drug therapy: Secondary | ICD-10-CM | POA: Diagnosis not present

## 2024-10-25 DIAGNOSIS — G4730 Sleep apnea, unspecified: Secondary | ICD-10-CM | POA: Diagnosis not present

## 2024-10-25 DIAGNOSIS — Z8249 Family history of ischemic heart disease and other diseases of the circulatory system: Secondary | ICD-10-CM | POA: Diagnosis not present

## 2024-10-25 DIAGNOSIS — E1122 Type 2 diabetes mellitus with diabetic chronic kidney disease: Secondary | ICD-10-CM | POA: Insufficient documentation

## 2024-10-25 DIAGNOSIS — I129 Hypertensive chronic kidney disease with stage 1 through stage 4 chronic kidney disease, or unspecified chronic kidney disease: Secondary | ICD-10-CM | POA: Diagnosis not present

## 2024-10-25 DIAGNOSIS — Z820 Family history of epilepsy and other diseases of the nervous system: Secondary | ICD-10-CM | POA: Insufficient documentation

## 2024-10-25 DIAGNOSIS — G40409 Other generalized epilepsy and epileptic syndromes, not intractable, without status epilepticus: Secondary | ICD-10-CM | POA: Insufficient documentation

## 2024-10-25 DIAGNOSIS — M76891 Other specified enthesopathies of right lower limb, excluding foot: Secondary | ICD-10-CM | POA: Diagnosis present

## 2024-10-25 DIAGNOSIS — Z01812 Encounter for preprocedural laboratory examination: Secondary | ICD-10-CM

## 2024-10-25 DIAGNOSIS — J4489 Other specified chronic obstructive pulmonary disease: Secondary | ICD-10-CM | POA: Diagnosis not present

## 2024-10-25 HISTORY — PX: TOTAL HIP ARTHROPLASTY: SHX124

## 2024-10-25 HISTORY — DX: Hyperlipidemia, unspecified: E78.5

## 2024-10-25 HISTORY — DX: Other forms of dyspnea: R06.09

## 2024-10-25 HISTORY — DX: Anemia complicating pregnancy, unspecified trimester: O99.019

## 2024-10-25 HISTORY — DX: Type 2 diabetes mellitus without complications: E11.9

## 2024-10-25 LAB — ABO/RH: ABO/RH(D): O POS

## 2024-10-25 LAB — POCT I-STAT, CHEM 8
BUN: 8 mg/dL (ref 8–23)
Calcium, Ion: 1.15 mmol/L (ref 1.15–1.40)
Chloride: 99 mmol/L (ref 98–111)
Creatinine, Ser: 0.8 mg/dL (ref 0.44–1.00)
Glucose, Bld: 196 mg/dL — ABNORMAL HIGH (ref 70–99)
HCT: 40 % (ref 36.0–46.0)
Hemoglobin: 13.6 g/dL (ref 12.0–15.0)
Potassium: 4 mmol/L (ref 3.5–5.1)
Sodium: 136 mmol/L (ref 135–145)
TCO2: 26 mmol/L (ref 22–32)

## 2024-10-25 LAB — GLUCOSE, CAPILLARY
Glucose-Capillary: 181 mg/dL — ABNORMAL HIGH (ref 70–99)
Glucose-Capillary: 177 mg/dL — ABNORMAL HIGH (ref 70–99)
Glucose-Capillary: 241 mg/dL — ABNORMAL HIGH (ref 70–99)
Glucose-Capillary: 215 mg/dL — ABNORMAL HIGH (ref 70–99)

## 2024-10-25 SURGERY — ARTHROPLASTY, HIP, TOTAL, ANTERIOR APPROACH
Anesthesia: Monitor Anesthesia Care | Site: Hip | Laterality: Right

## 2024-10-25 MED ORDER — CEFAZOLIN SODIUM-DEXTROSE 2-4 GM/100ML-% IV SOLN
2.0000 g | Freq: Four times a day (QID) | INTRAVENOUS | Status: AC
  Administered 2024-10-25 (×2): 2 g via INTRAVENOUS
  Filled 2024-10-25: qty 100

## 2024-10-25 MED ORDER — SODIUM CHLORIDE (PF) 0.9 % IJ SOLN
INTRAMUSCULAR | Status: DC | PRN
Start: 2024-10-25 — End: 2024-10-25
  Administered 2024-10-25: 25 mL

## 2024-10-25 MED ORDER — PHENOL 1.4 % MT LIQD: 1.0000 | OROMUCOSAL | Status: DC | PRN

## 2024-10-25 MED ORDER — ACETAMINOPHEN 325 MG PO TABS: 325.0000 mg | ORAL_TABLET | Freq: Four times a day (QID) | ORAL | Status: DC | PRN

## 2024-10-25 MED ORDER — BUPIVACAINE HCL (PF) 0.5 % IJ SOLN
INTRAMUSCULAR | Status: AC
Start: 2024-10-25 — End: 2024-10-25
  Filled 2024-10-25: qty 10

## 2024-10-25 MED ORDER — PANTOPRAZOLE SODIUM 40 MG PO TBEC
40.0000 mg | DELAYED_RELEASE_TABLET | Freq: Every day | ORAL | Status: DC
  Administered 2024-10-25 – 2024-10-26 (×2): 40 mg via ORAL
  Filled 2024-10-25 (×2): qty 1

## 2024-10-25 MED ORDER — PHENYLEPHRINE HCL-NACL 20-0.9 MG/250ML-% IV SOLN
INTRAVENOUS | Status: DC | PRN
  Administered 2024-10-25: 30 ug/min via INTRAVENOUS

## 2024-10-25 MED ORDER — HYDROCODONE-ACETAMINOPHEN 5-325 MG PO TABS: 1.0000 | ORAL_TABLET | ORAL | Status: DC | PRN

## 2024-10-25 MED ORDER — PROPOFOL 500 MG/50ML IV EMUL
INTRAVENOUS | Status: DC | PRN
  Administered 2024-10-25: 80 ug/kg/min via INTRAVENOUS

## 2024-10-25 MED ORDER — BUPIVACAINE LIPOSOME 1.3 % IJ SUSP
INTRAMUSCULAR | Status: AC
  Filled 2024-10-25: qty 20

## 2024-10-25 MED ORDER — FENTANYL CITRATE (PF) 100 MCG/2ML IJ SOLN: 25.0000 ug | INTRAMUSCULAR | Status: DC | PRN

## 2024-10-25 MED ORDER — LIDOCAINE HCL (PF) 2 % IJ SOLN
INTRAMUSCULAR | Status: AC
  Filled 2024-10-25: qty 5

## 2024-10-25 MED ORDER — SODIUM CHLORIDE (PF) 0.9 % IJ SOLN
INTRAMUSCULAR | Status: AC
  Filled 2024-10-25: qty 10

## 2024-10-25 MED ORDER — ACETAMINOPHEN 10 MG/ML IV SOLN
INTRAVENOUS | Status: AC
  Filled 2024-10-25: qty 100

## 2024-10-25 MED ORDER — DOCUSATE SODIUM 100 MG PO CAPS
100.0000 mg | ORAL_CAPSULE | Freq: Two times a day (BID) | ORAL | Status: DC
  Administered 2024-10-25 – 2024-10-26 (×2): 100 mg via ORAL
  Filled 2024-10-25 (×2): qty 1

## 2024-10-25 MED ORDER — OXYCODONE HCL 5 MG/5ML PO SOLN: 5.0000 mg | Freq: Once | ORAL | Status: AC | PRN

## 2024-10-25 MED ORDER — ONDANSETRON HCL 4 MG PO TABS: 4.0000 mg | ORAL_TABLET | Freq: Four times a day (QID) | ORAL | Status: DC | PRN

## 2024-10-25 MED ORDER — INSULIN ASPART 100 UNIT/ML IJ SOLN
0.0000 [IU] | Freq: Three times a day (TID) | INTRAMUSCULAR | Status: DC
  Administered 2024-10-25: 5 [IU] via SUBCUTANEOUS
  Administered 2024-10-25: 3 [IU] via SUBCUTANEOUS
  Administered 2024-10-26: 5 [IU] via SUBCUTANEOUS
  Administered 2024-10-26: 3 [IU] via SUBCUTANEOUS
  Filled 2024-10-25: qty 3
  Filled 2024-10-25: qty 5
  Filled 2024-10-25: qty 3
  Filled 2024-10-25: qty 5

## 2024-10-25 MED ORDER — MIDAZOLAM HCL 5 MG/5ML IJ SOLN
INTRAMUSCULAR | Status: DC | PRN
Start: 2024-10-25 — End: 2024-10-25
  Administered 2024-10-25 (×2): 1 mg via INTRAVENOUS

## 2024-10-25 MED ORDER — PROPOFOL 1000 MG/100ML IV EMUL
INTRAVENOUS | Status: AC
  Filled 2024-10-25: qty 100

## 2024-10-25 MED ORDER — OXYCODONE HCL 5 MG PO TABS
ORAL_TABLET | ORAL | Status: AC
  Filled 2024-10-25: qty 1

## 2024-10-25 MED ORDER — ENOXAPARIN SODIUM 40 MG/0.4ML IJ SOSY
40.0000 mg | PREFILLED_SYRINGE | INTRAMUSCULAR | Status: DC
  Administered 2024-10-26: 40 mg via SUBCUTANEOUS
  Filled 2024-10-25: qty 0.4

## 2024-10-25 MED ORDER — TRAMADOL HCL 50 MG PO TABS
50.0000 mg | ORAL_TABLET | Freq: Four times a day (QID) | ORAL | Status: DC | PRN
  Administered 2024-10-25 (×2): 50 mg via ORAL
  Filled 2024-10-25 (×2): qty 1

## 2024-10-25 MED ORDER — ACETAMINOPHEN 500 MG PO TABS
1000.0000 mg | ORAL_TABLET | Freq: Three times a day (TID) | ORAL | 0 refills | Status: AC
  Filled 2024-10-25: qty 30, 5d supply, fill #0

## 2024-10-25 MED ORDER — CHLORHEXIDINE GLUCONATE 4 % EX SOLN: 1.0000 | CUTANEOUS | 1 refills | Status: DC

## 2024-10-25 MED ORDER — ATORVASTATIN CALCIUM 10 MG PO TABS
40.0000 mg | ORAL_TABLET | Freq: Every morning | ORAL | Status: DC
  Administered 2024-10-26: 40 mg via ORAL
  Filled 2024-10-25: qty 4

## 2024-10-25 MED ORDER — PHENYLEPHRINE 80 MCG/ML (10ML) SYRINGE FOR IV PUSH (FOR BLOOD PRESSURE SUPPORT)
PREFILLED_SYRINGE | INTRAVENOUS | Status: DC | PRN
  Administered 2024-10-25: 80 ug via INTRAVENOUS

## 2024-10-25 MED ORDER — ONDANSETRON HCL 4 MG/2ML IJ SOLN
INTRAMUSCULAR | Status: DC | PRN
  Administered 2024-10-25: 4 mg via INTRAVENOUS

## 2024-10-25 MED ORDER — TROSPIUM CHLORIDE ER 60 MG PO CP24: 60.0000 mg | ORAL_CAPSULE | ORAL | Status: DC

## 2024-10-25 MED ORDER — SURGIRINSE WOUND IRRIGATION SYSTEM - OPTIME
TOPICAL | Status: DC | PRN
  Administered 2024-10-25: 450 mL

## 2024-10-25 MED ORDER — ACETAMINOPHEN 10 MG/ML IV SOLN
INTRAVENOUS | Status: DC | PRN
  Administered 2024-10-25: 1000 mg via INTRAVENOUS

## 2024-10-25 MED ORDER — CEFAZOLIN SODIUM-DEXTROSE 2-4 GM/100ML-% IV SOLN
INTRAVENOUS | Status: AC
Start: 2024-10-25 — End: 2024-10-25
  Filled 2024-10-25: qty 100

## 2024-10-25 MED ORDER — METOCLOPRAMIDE HCL 10 MG PO TABS: 5.0000 mg | ORAL_TABLET | Freq: Three times a day (TID) | ORAL | Status: DC | PRN

## 2024-10-25 MED ORDER — MENTHOL 3 MG MT LOZG: 1.0000 | LOZENGE | OROMUCOSAL | Status: DC | PRN

## 2024-10-25 MED ORDER — KETOROLAC TROMETHAMINE 15 MG/ML IJ SOLN
INTRAMUSCULAR | Status: AC
  Filled 2024-10-25: qty 1

## 2024-10-25 MED ORDER — MIDAZOLAM HCL 2 MG/2ML IJ SOLN
INTRAMUSCULAR | Status: AC
  Filled 2024-10-25: qty 2

## 2024-10-25 MED ORDER — SPIRONOLACTONE 25 MG PO TABS
25.0000 mg | ORAL_TABLET | Freq: Every morning | ORAL | Status: DC
  Administered 2024-10-25 – 2024-10-26 (×2): 25 mg via ORAL
  Filled 2024-10-25 (×2): qty 1

## 2024-10-25 MED ORDER — ALBUTEROL SULFATE (2.5 MG/3ML) 0.083% IN NEBU: 3.0000 mL | INHALATION_SOLUTION | Freq: Four times a day (QID) | RESPIRATORY_TRACT | Status: DC | PRN

## 2024-10-25 MED ORDER — OXYCODONE HCL 5 MG PO TABS
2.5000 mg | ORAL_TABLET | Freq: Three times a day (TID) | ORAL | 0 refills | Status: AC | PRN
  Filled 2024-10-25: qty 20, 7d supply, fill #0

## 2024-10-25 MED ORDER — MUPIROCIN 2 % EX OINT
1.0000 | TOPICAL_OINTMENT | Freq: Two times a day (BID) | CUTANEOUS | 0 refills | Status: AC
  Filled 2024-10-25: qty 22, 11d supply, fill #0

## 2024-10-25 MED ORDER — INSULIN ASPART 100 UNIT/ML IJ SOLN
0.0000 [IU] | Freq: Every day | INTRAMUSCULAR | Status: DC
  Administered 2024-10-25: 2 [IU] via SUBCUTANEOUS
  Filled 2024-10-25: qty 2

## 2024-10-25 MED ORDER — BUPIVACAINE HCL (PF) 0.5 % IJ SOLN
INTRAMUSCULAR | Status: DC | PRN
  Administered 2024-10-25: 2.6 mL via INTRATHECAL

## 2024-10-25 MED ORDER — SODIUM CHLORIDE 0.9 % IV SOLN
INTRAVENOUS | Status: DC | PRN
  Administered 2024-10-25: 30 mL

## 2024-10-25 MED ORDER — METOCLOPRAMIDE HCL 5 MG/ML IJ SOLN: 5.0000 mg | Freq: Three times a day (TID) | INTRAMUSCULAR | Status: DC | PRN

## 2024-10-25 MED ORDER — CHLORHEXIDINE GLUCONATE 0.12 % MT SOLN
OROMUCOSAL | Status: AC
  Filled 2024-10-25: qty 15

## 2024-10-25 MED ORDER — DOCUSATE SODIUM 100 MG PO CAPS
100.0000 mg | ORAL_CAPSULE | Freq: Two times a day (BID) | ORAL | 0 refills | Status: AC
  Filled 2024-10-25: qty 10, 5d supply, fill #0

## 2024-10-25 MED ORDER — BUPIVACAINE-EPINEPHRINE (PF) 0.25% -1:200000 IJ SOLN
INTRAMUSCULAR | Status: AC
  Filled 2024-10-25: qty 30

## 2024-10-25 MED ORDER — CELECOXIB 200 MG PO CAPS
200.0000 mg | ORAL_CAPSULE | Freq: Two times a day (BID) | ORAL | 0 refills | Status: AC
  Filled 2024-10-25: qty 28, 14d supply, fill #0

## 2024-10-25 MED ORDER — BUPIVACAINE-EPINEPHRINE (PF) 0.25% -1:200000 IJ SOLN
INTRAMUSCULAR | Status: DC | PRN
Start: 2024-10-25 — End: 2024-10-25
  Administered 2024-10-25: 30 mL

## 2024-10-25 MED ORDER — ONDANSETRON HCL 4 MG/2ML IJ SOLN
INTRAMUSCULAR | Status: AC
  Filled 2024-10-25: qty 2

## 2024-10-25 MED ORDER — TRANEXAMIC ACID-NACL 1000-0.7 MG/100ML-% IV SOLN
INTRAVENOUS | Status: AC
  Filled 2024-10-25: qty 100

## 2024-10-25 MED ORDER — DROPERIDOL 2.5 MG/ML IJ SOLN: 0.6250 mg | Freq: Once | INTRAMUSCULAR | Status: DC | PRN

## 2024-10-25 MED ORDER — MUPIROCIN 2 % EX OINT: 1.0000 | TOPICAL_OINTMENT | Freq: Two times a day (BID) | CUTANEOUS | 0 refills | Status: DC

## 2024-10-25 MED ORDER — ONDANSETRON HCL 4 MG/2ML IJ SOLN: 4.0000 mg | Freq: Four times a day (QID) | INTRAMUSCULAR | Status: DC | PRN

## 2024-10-25 MED ORDER — MORPHINE SULFATE (PF) 2 MG/ML IV SOLN: 0.5000 mg | INTRAVENOUS | Status: DC | PRN

## 2024-10-25 MED ORDER — TRAMADOL HCL 50 MG PO TABS
50.0000 mg | ORAL_TABLET | Freq: Four times a day (QID) | ORAL | 0 refills | Status: AC | PRN
  Filled 2024-10-25: qty 30, 8d supply, fill #0

## 2024-10-25 MED ORDER — PROPOFOL 10 MG/ML IV BOLUS
INTRAVENOUS | Status: DC | PRN
  Administered 2024-10-25: 50 ug via INTRAVENOUS

## 2024-10-25 MED ORDER — ONDANSETRON HCL 4 MG PO TABS
4.0000 mg | ORAL_TABLET | Freq: Four times a day (QID) | ORAL | 0 refills | Status: AC | PRN
  Filled 2024-10-25: qty 20, 5d supply, fill #0

## 2024-10-25 MED ORDER — DIVALPROEX SODIUM ER 500 MG PO TB24
750.0000 mg | ORAL_TABLET | Freq: Every day | ORAL | Status: DC
  Administered 2024-10-26: 750 mg via ORAL
  Filled 2024-10-25: qty 1

## 2024-10-25 MED ORDER — PHENYLEPHRINE HCL-NACL 20-0.9 MG/250ML-% IV SOLN
INTRAVENOUS | Status: AC
  Filled 2024-10-25: qty 250

## 2024-10-25 MED ORDER — 0.9 % SODIUM CHLORIDE (POUR BTL) OPTIME
TOPICAL | Status: DC | PRN
  Administered 2024-10-25: 1000 mL

## 2024-10-25 MED ORDER — KETOROLAC TROMETHAMINE 15 MG/ML IJ SOLN
7.5000 mg | Freq: Four times a day (QID) | INTRAMUSCULAR | Status: AC
  Administered 2024-10-25 – 2024-10-26 (×4): 7.5 mg via INTRAVENOUS
  Filled 2024-10-25 (×3): qty 1

## 2024-10-25 MED ORDER — FENTANYL CITRATE (PF) 100 MCG/2ML IJ SOLN
INTRAMUSCULAR | Status: DC | PRN
  Administered 2024-10-25: 25 ug via INTRAVENOUS

## 2024-10-25 MED ORDER — CHLORHEXIDINE GLUCONATE 4 % EX SOLN
1.0000 | CUTANEOUS | 1 refills | Status: AC
  Filled 2024-10-25: qty 472, 30d supply, fill #0

## 2024-10-25 MED ORDER — PROPOFOL 10 MG/ML IV BOLUS
INTRAVENOUS | Status: AC
  Filled 2024-10-25: qty 20

## 2024-10-25 MED ORDER — OXYCODONE HCL 5 MG PO TABS
5.0000 mg | ORAL_TABLET | Freq: Once | ORAL | Status: AC | PRN
  Administered 2024-10-25: 5 mg via ORAL

## 2024-10-25 MED ORDER — ENOXAPARIN SODIUM 40 MG/0.4ML IJ SOSY
40.0000 mg | PREFILLED_SYRINGE | INTRAMUSCULAR | 0 refills | Status: AC
  Filled 2024-10-25: qty 5.6, 14d supply, fill #0

## 2024-10-25 MED ORDER — ACETAMINOPHEN 10 MG/ML IV SOLN: 1000.0000 mg | Freq: Once | INTRAVENOUS | Status: DC | PRN

## 2024-10-25 MED ORDER — SODIUM CHLORIDE 0.9 % IV SOLN: INTRAVENOUS | Status: DC

## 2024-10-25 MED ORDER — FENTANYL CITRATE (PF) 100 MCG/2ML IJ SOLN
INTRAMUSCULAR | Status: AC
  Filled 2024-10-25: qty 2

## 2024-10-25 MED ORDER — ACETAMINOPHEN 500 MG PO TABS
1000.0000 mg | ORAL_TABLET | Freq: Three times a day (TID) | ORAL | Status: DC
  Administered 2024-10-25 – 2024-10-26 (×2): 1000 mg via ORAL
  Filled 2024-10-25 (×2): qty 2

## 2024-10-25 MED ORDER — CEFAZOLIN SODIUM-DEXTROSE 2-4 GM/100ML-% IV SOLN
INTRAVENOUS | Status: AC
  Filled 2024-10-25: qty 100

## 2024-10-25 SURGICAL SUPPLY — 60 items
BLADE CLIPPER SURG (BLADE) IMPLANT
BLADE SAGITTAL AGGR TOOTH XLG (BLADE) ×1 IMPLANT
BNDG COHESIVE 6X5 TAN ST LF (GAUZE/BANDAGES/DRESSINGS) ×2 IMPLANT
BRUSH SCRUB EZ PLAIN DRY (MISCELLANEOUS) ×1 IMPLANT
CHLORAPREP W/TINT 26 (MISCELLANEOUS) ×1 IMPLANT
DERMABOND ADVANCED .7 DNX12 (GAUZE/BANDAGES/DRESSINGS) ×1 IMPLANT
DRAPE C-ARM XRAY 36X54 (DRAPES) ×1 IMPLANT
DRAPE SHEET LG 3/4 BI-LAMINATE (DRAPES) ×2 IMPLANT
DRAPE TABLE BACK 80X90 (DRAPES) ×1 IMPLANT
DRAPE UTILITY 15X26 TOWEL STRL (DRAPES) IMPLANT
DRSG MEPILEX SACRM 8.7X9.8 (GAUZE/BANDAGES/DRESSINGS) ×1 IMPLANT
DRSG OPSITE POSTOP 4X8 (GAUZE/BANDAGES/DRESSINGS) ×1 IMPLANT
ELECTRODE BLDE 4.0 EZ CLN MEGD (MISCELLANEOUS) ×1 IMPLANT
ELECTRODE REM PT RTRN 9FT ADLT (ELECTROSURGICAL) ×1 IMPLANT
GLOVE BIO SURGEON STRL SZ8 (GLOVE) ×1 IMPLANT
GLOVE BIOGEL PI IND STRL 8 (GLOVE) ×1 IMPLANT
GLOVE PI ORTHO PRO STRL 7.5 (GLOVE) ×2 IMPLANT
GLOVE PI ORTHO PRO STRL SZ8 (GLOVE) ×2 IMPLANT
GLOVE SURG SYN 7.5 PF PI (GLOVE) ×1 IMPLANT
GOWN SRG XL LONG LVL 3 NONREIN (GOWNS) ×1 IMPLANT
GOWN SRG XL LVL 3 NONREINFORCE (GOWNS) ×1 IMPLANT
GOWN STRL REUS W/ TWL LRG LVL3 (GOWN DISPOSABLE) ×1 IMPLANT
HEAD BIOLOX HIP 36/-2.5 (Joint) IMPLANT
HOOD PEEL AWAY T7 (MISCELLANEOUS) ×2 IMPLANT
INSERT 0 DEGREE 36 (Miscellaneous) IMPLANT
IV NS 100ML SINGLE PACK (IV SOLUTION) ×1 IMPLANT
KIT PATIENT CARE HANA TABLE (KITS) ×1 IMPLANT
KIT TURNOVER CYSTO (KITS) ×1 IMPLANT
LIGHT WAVEGUIDE WIDE FLAT (MISCELLANEOUS) ×1 IMPLANT
MANIFOLD NEPTUNE II (INSTRUMENTS) ×1 IMPLANT
MARKER SKIN DUAL TIP RULER LAB (MISCELLANEOUS) ×1 IMPLANT
MAT ABSORB FLUID 56X50 GRAY (MISCELLANEOUS) ×1 IMPLANT
NDL SPNL 20GX3.5 QUINCKE YW (NEEDLE) ×1 IMPLANT
NEEDLE SPNL 20GX3.5 QUINCKE YW (NEEDLE) ×1 IMPLANT
NS IRRIG 500ML POUR BTL (IV SOLUTION) ×1 IMPLANT
PACK HIP COMPR (MISCELLANEOUS) ×1 IMPLANT
PAD ARMBOARD POSITIONER FOAM (MISCELLANEOUS) ×1 IMPLANT
PENCIL SMOKE EVACUATOR (MISCELLANEOUS) ×1 IMPLANT
SCREW HEX LP 6.5X15 (Screw) IMPLANT
SCREW HEX LP 6.5X25 (Screw) IMPLANT
SHELL ACETAB TRIDENT 48 (Shell) IMPLANT
SLEEVE PROTECTION STRL DISP (MISCELLANEOUS) IMPLANT
SLEEVE SCD COMPRESS KNEE MED (STOCKING) ×1 IMPLANT
SOLN 0.9% NACL POUR BTL 1000ML (IV SOLUTION) IMPLANT
SOLN STERILE WATER BTL 1000 ML (IV SOLUTION) ×1 IMPLANT
SOLUTION IRRIG SURGIPHOR (IV SOLUTION) ×1 IMPLANT
STEM STD OFFSET SZ2 32.5 (Stem) IMPLANT
SURGIFLO W/THROMBIN 8M KIT (HEMOSTASIS) IMPLANT
SUT BONE WAX W31G (SUTURE) ×1 IMPLANT
SUT ETHIBOND 2 V 37 (SUTURE) ×1 IMPLANT
SUT SILK 0 30XBRD TIE 6 (SUTURE) ×1 IMPLANT
SUT STRATAFIX 14 PDO 36 VLT (SUTURE) ×1 IMPLANT
SUT VIC AB 0 CT1 36 (SUTURE) ×1 IMPLANT
SUT VIC AB 2-0 CT2 27 (SUTURE) ×1 IMPLANT
SUTURE STRATA SPIR 4-0 18 (SUTURE) ×1 IMPLANT
SYR 20ML LL LF (SYRINGE) ×2 IMPLANT
TAPE MICROFOAM 4IN (TAPE) IMPLANT
TOWEL OR 17X26 4PK STRL BLUE (TOWEL DISPOSABLE) IMPLANT
TRAP FLUID SMOKE EVACUATOR (MISCELLANEOUS) ×1 IMPLANT
WAND WEREWOLF FASTSEAL 6.0 (MISCELLANEOUS) ×1 IMPLANT

## 2024-10-25 NOTE — Progress Notes (Signed)
 Physical Therapy Treatment Patient Details Name: Crystal Foley MRN: [id number] DOB: [birth year] Today's Date: 10/25/2024   History of Present Illness Crystal Foley is an 73 y.o. female presents for history and physical for right anterior total hip arthroplasty with Dr. Lorelle on 10/25/2024. She has had greater than 1 year of right groin lateral hip and thigh pain. Pain has been moderate to severe. She recently had a diagnostic lidocaine  bupivacaine injection with excellent results. 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 Patient's pain has interfered with her quality of life and activities daily living.    PT Comments  Patient seen for PT session focused on ambulation. Patient required minA for 120' feet and used RW. Tolerated session well with  signs of exertion or distress. Vitals remained stable during activity. Main limiting factors today were pain . Interventions aimed at improving functional mobility . Patient shows good potential to make progress with continued acute level rehab. Continued skilled PT recommended to progress toward functional goals and support discharge readiness. Pt making good progress toward goals, will continue to follow POC. Discharge recommendation remains appropriate     If plan is discharge home, recommend the following: A lot of help with walking and/or transfers;A lot of help with bathing/dressing/bathroom   Can travel by private vehicle        Equipment Recommendations  Rolling walker (2 wheels)    Recommendations for Other Services       Precautions / Restrictions Precautions Precautions: Fall Recall of Precautions/Restrictions: Intact Restrictions Weight Bearing Restrictions Per Provider Order: Yes RLE Weight Bearing Per Provider Order: Weight bearing as tolerated     Mobility  Bed Mobility Overal bed mobility: Needs Assistance Bed Mobility: Supine to Sit     Supine to sit: Min assist, Contact guard      General bed mobility comments: slow to move and requires light assist to move to edge of bed    Transfers Overall transfer level: Needs assistance Equipment used: Rolling walker (2 wheels) Transfers: Sit to/from Stand Sit to Stand: Min assist, Contact guard assist           General transfer comment: minA initally and regressed to totalA from nurse; pt needs verbal cues to use legs to stand; observable fear of standing due to history of fall and hip pain    Ambulation/Gait Ambulation/Gait assistance: Min assist, Contact guard assist Gait Distance (Feet): 120 Feet Assistive device: Rolling walker (2 wheels) Gait Pattern/deviations: Step-through pattern, Decreased step length - left     Pre-gait activities: sit to stand x 5; minA General Gait Details: light vc for RW management   Stairs             Wheelchair Mobility     Tilt Bed    Modified Rankin (Stroke Patients Only)       Balance Overall balance assessment: Needs assistance Sitting-balance support: Feet supported Sitting balance-Leahy Scale: Good     Standing balance support: During functional activity, Reliant on assistive device for balance Standing balance-Leahy Scale: Good                              Communication Communication Communication: No apparent difficulties  Cognition Arousal: Alert Behavior During Therapy: WFL for tasks assessed/performed   PT - Cognitive impairments: No apparent impairments  Following commands: Intact      Cueing Cueing Techniques: Verbal cues  Exercises Total Joint Exercises Ankle Circles/Pumps: AROM, Both, 10 reps Quad Sets: AROM, Both, 10 reps Gluteal Sets: AROM, Both, 10 reps Heel Slides: AROM, Both, 10 reps Hip ABduction/ADduction: AROM, Right, 5 reps Long Arc Quad: AROM, Both, 5 reps    General Comments        Pertinent Vitals/Pain Pain Assessment Pain Assessment: 0-10 Pain Score: 6  Pain  Location: R hip Pain Descriptors / Indicators: Aching Pain Intervention(s): Limited activity within patient's tolerance, Monitored during session, Premedicated before session    Home Living Family/patient expects to be discharged to:: Private residence Living Arrangements: Spouse/significant other Available Help at Discharge: Family Type of Home: Apartment         Home Layout: Multi-level;Able to live on main level with bedroom/bathroom Home Equipment: Rollator (4 wheels);Cane - single point Additional Comments: history of fall x1    Prior Function            PT Goals (current goals can now be found in the care plan section) Acute Rehab PT Goals Patient Stated Goal: Pt wants to get better PT Goal Formulation: With patient/family Time For Goal Achievement: 11/23/24 Potential to Achieve Goals: Good Progress towards PT goals: Progressing toward goals    Frequency    BID      PT Plan      Co-evaluation              AM-PAC PT 6 Clicks Mobility   Outcome Measure  Help needed turning from your back to your side while in a flat bed without using bedrails?: A Little Help needed moving from lying on your back to sitting on the side of a flat bed without using bedrails?: A Little Help needed moving to and from a bed to a chair (including a wheelchair)?: A Little Help needed standing up from a chair using your arms (e.g., wheelchair or bedside chair)?: A Little Help needed to walk in hospital room?: A Little Help needed climbing 3-5 steps with a railing? : A Lot 6 Click Score: 17    End of Session Equipment Utilized During Treatment: Gait belt Activity Tolerance: Patient tolerated treatment well Patient left: in bed;with bed alarm set;with nursing/sitter in room;with call bell/phone within reach Nurse Communication: Mobility status PT Visit Diagnosis: Other abnormalities of gait and mobility (R26.89);Muscle weakness (generalized) (M62.81);Difficulty in walking,  not elsewhere classified (R26.2);Pain Pain - Right/Left: Right Pain - part of body: Hip     Time: 9584-9571 PT Time Calculation (min) (ACUTE ONLY): 13 min  Charges:    $Therapeutic Activity: 23-37 mins PT General Charges $$ ACUTE PT VISIT: 1 Visit                     Crystal Lesches DPT, PT     Crystal Foley 10/25/2024, 4:54 PM

## 2024-10-25 NOTE — TOC CM/SW Note (Cosign Needed)
 Transition of Care (TOC) CM/SW Note   .Patient is not able to walk the distance required to go the bathroom, or he/she is unable to safely negotiate stairs required to access the bathroom.  A 3in1 BSC will alleviate this problem

## 2024-10-25 NOTE — Anesthesia Preprocedure Evaluation (Signed)
 Anesthesia Evaluation  Patient identified by MRN, date of birth, ID band Patient awake    Reviewed: Allergy & Precautions, H&P , NPO status , Patient's Chart, lab work & pertinent test results, reviewed documented beta blocker date and time   Airway Mallampati: II   Neck ROM: full    Dental  (+) Poor Dentition   Pulmonary neg shortness of breath, asthma , sleep apnea , pneumonia, resolved, COPD,  COPD inhaler, neg recent URI   Pulmonary exam normal        Cardiovascular Exercise Tolerance: Poor hypertension, On Medications + DOE  Normal cardiovascular exam Rhythm:regular Rate:Normal     Neuro/Psych Seizures -, Well Controlled,   negative psych ROS   GI/Hepatic negative GI ROS, Neg liver ROS,,,  Endo/Other  diabetesHypothyroidism    Renal/GU Renal disease  negative genitourinary   Musculoskeletal   Abdominal   Peds  Hematology  (+) Blood dyscrasia, anemia   Anesthesia Other Findings Past Medical History: No date: Anemia affecting pregnancy No date: Aortic atherosclerosis No date: Asthma No date: CKD (chronic kidney disease), stage II No date: COPD (chronic obstructive pulmonary disease) (HCC) No date: DOE (dyspnea on exertion) 1969: Electrocution     Comment:  junior in high school [place] a farm with a wire No date: Generalized convulsive epilepsy (HCC)     Comment:  a.) last reported seizure was in 2010 No date: 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) No date: Hematuria, gross No date: History of sepsis No date: HLD (hyperlipidemia) No date: Hypertension No date: Hypothyroidism No date: Nephrolithiasis No date: Obesity No date: Osteoarthritis of right hip No date: Seizures (HCC)     Comment:  last seizure was in 2010 No date: Sleep apnea (no CPAP) No date: T2DM (type 2 diabetes mellitus) (HCC) Past Surgical History: No date: Bladder tack No date: COLONOSCOPY 03/25/2018:  COLONOSCOPY WITH PROPOFOL ; N/A     Comment:  Procedure: COLONOSCOPY WITH PROPOFOL ;  Surgeon: Toledo,               Ladell POUR, MD;  Location: [facility];  Service:               Gastroenterology;  Laterality: N/A; 05/13/2018: COLONOSCOPY WITH PROPOFOL ; N/A     Comment:  Procedure: COLONOSCOPY WITH PROPOFOL ;  Surgeon: Toledo,               Ladell POUR, MD;  Location: [facility];  Service:               Gastroenterology;  Laterality: N/A; 02/25/2024: COLONOSCOPY WITH PROPOFOL ; N/A     Comment:  Procedure: COLONOSCOPY WITH PROPOFOL ;  Surgeon: Toledo,               Ladell POUR, MD;  Location: [facility];  Service:               Gastroenterology;  Laterality: N/A; 03/11/2015: CYSTOSCOPY W/ URETERAL STENT PLACEMENT 03/17/2015: CYSTOSCOPY WITH RETROGRADE PYELOGRAM, URETEROSCOPY AND  STENT PLACEMENT; Left     Comment:  Procedure: CYSTOSCOPY, STENT REMOVAL, LEFT URETEROSCOPY               WITH STONE REMOVAL WITH BASKET;  Surgeon: Norleen Seltzer, MD;              Location: [facility];  Service: Urology;  Laterality: Left; 02/25/2024: POLYPECTOMY     Comment:  Procedure: POLYPECTOMY;  Surgeon: Aundria, Teodoro K, MD;  Location: [facility];  Service: Gastroenterology;; No date: SHOULDER ARTHROSCOPY DISTAL CLAVICLE EXCISION AND OPEN  ROTATOR CUFF REPAIR; Left No date: TUBAL LIGATION BMI    Body Mass Index: 26.43 kg/m     Reproductive/Obstetrics negative OB ROS                              Anesthesia Physical Anesthesia Plan  ASA: 3  Anesthesia Plan: General   Post-op Pain Management:    Induction:   PONV Risk Score and Plan:   Airway Management Planned:   Additional Equipment:   Intra-op Plan:   Post-operative Plan:   Informed Consent: I have reviewed the patients History and Physical, chart, labs and discussed the procedure including the risks, benefits and alternatives for the proposed anesthesia with the patient or authorized  representative who has indicated his/her understanding and acceptance.     Dental Advisory Given  Plan Discussed with: CRNA  Anesthesia Plan Comments:         Anesthesia Quick Evaluation

## 2024-10-25 NOTE — Anesthesia Procedure Notes (Addendum)
 Spinal  Patient location during procedure: OR Start time: 10/25/2024 7:38 AM Staffing Performed: other anesthesia staff  Anesthesiologist: Myra Lynwood MATSU, MD Resident/CRNA: Lorrene Camelia LABOR, CRNA Other anesthesia staff: Estelle Merck, Hipolito, RN Performed by: Estelle Merck, Hipolito, RN Authorized by: Myra Lynwood MATSU, MD   Preanesthetic Checklist Completed: patient identified, IV checked, site marked, risks and benefits discussed, surgical consent, monitors and equipment checked, pre-op evaluation and timeout performed Spinal Block Patient position: sitting Prep: ChloraPrep Patient monitoring: heart rate, cardiac monitor, continuous pulse ox and blood pressure Approach: midline Location: L3-4 Injection technique: single-shot Needle Needle type: Pencan  Needle gauge: 24 G Needle length: 10 cm Assessment Sensory level: T4 Events: CSF return Additional Notes Atraumatic

## 2024-10-25 NOTE — TOC Initial Note (Signed)
 Transition of Care [facility]) - Initial/Assessment Note    Patient Details  Name: Crystal Foley MRN: [id number] Date of Birth: October 01, 1951  Transition of [facility]) CM/SW Contact:    Crystal Louder, LCSW Phone Number: 10/25/2024, 2:18 PM  Clinical Narrative:        Per chart review patient is from home. PCP is Crystal Foley LCSWA ordered DME RW and BSC through DME company [employer]. Patient is set up with [facility].   TOC to follow for discharge              Patient Goals and CMS Choice            Expected Discharge Plan and Services                                              Prior Living Arrangements/Services                       Activities of Daily Living   ADL Screening (condition at time of admission) Independently performs ADLs?: Yes (appropriate for developmental age) Is the patient deaf or have difficulty hearing?: No Does the patient have difficulty seeing, even when wearing glasses/contacts?: Yes (wears glasses) Does the patient have difficulty concentrating, remembering, or making decisions?: No  Permission Sought/Granted                  Emotional Assessment              Admission diagnosis:  Primary osteoarthritis of right hip [M16.11] Enthesopathy of right hip region [M76.891] 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] Patient Active Problem List   Diagnosis Date Noted   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   Recurrent syncope 02/26/2024   Periorbital hematoma of left eye 02/26/2024   Hypoxia 02/26/2024   COPD (chronic obstructive pulmonary disease) (HCC)    Hypothyroidism    UTI (urinary tract infection) 03/22/2022   Syncope 03/21/2022   Elevated lactic acid level 03/21/2022   Sepsis secondary to UTI (HCC) 03/21/2022   Aortic atherosclerosis 12/29/2018   Community acquired pneumonia 12/24/2018   Sepsis (HCC) 12/23/2018   Health care maintenance 09/19/2015   Nephrolithiasis 06/01/2015    Hypercholesterolemia without hypertriglyceridemia 09/27/2014   Pure hypercholesterolemia 09/27/2014   Adult BMI 30+ 09/25/2014   Morbid obesity (HCC) 09/25/2014   Severe obesity (BMI 35.0-35.9 with comorbidity) (HCC) 09/25/2014   Asthma 05/15/2014   Hypertension 05/15/2014   HLD (hyperlipidemia) 05/15/2014   Seizure disorder (HCC) 05/15/2014   Diabetes mellitus, type 2 (HCC) 05/15/2014   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) 05/15/2014   PCP:  Foley Crystal ORN, MD Pharmacy:   [facility] [phone] [address], [place] - [address] [place] [address] Phone: [phone] Fax: [phone]  [facility] [place], [place] - [address] [place] [address] Phone: [phone] Fax: [phone]  [facility] [address] [place] [address] Phone: [phone] Fax: [phone]     Social Drivers of Health (SDOH) Social History: SDOH Screenings   Food Insecurity: No Food Insecurity (10/25/2024)  Housing: Low Risk  (10/25/2024)  Transportation Needs: No Transportation Needs (10/25/2024)  Utilities: Not At Risk (10/25/2024)  Financial Resource Strain: Low Risk  (  10/01/2024)   Received from [facility]  Social Connections: Socially Integrated (10/25/2024)  Tobacco Use: Low Risk  (10/25/2024)   SDOH Interventions:     Readmission Risk Interventions     No data to display

## 2024-10-25 NOTE — Op Note (Signed)
 Patient Name: Crystal Foley  FMW:[id number]  Pre-Operative Diagnosis: Right hip Osteoarthritis  Post-Operative Diagnosis: (same)  Procedure: Right Total Hip Arthroplasty  Components/Implants: Cup: Trident Tritanium Clusterhole 48/D w/x 2 screws    Liner: Neutral X3 poly 36/D  Stem: Insignia #2 std offset  Head:Biolox ceramic 36 -2.5  Date of Surgery: 10/25/2024  Surgeon: Arthea Sheer MD  Assistant: Debby Amber PA (present and scrubbed throughout the case, critical for assistance with exposure, retraction, instrumentation, and closure)   Anesthesiologist: Adams  Anesthesia: Spinal   EBL: 100cc  IVF:700cc  Complications: None   Brief history: The patient is a 73 year old female with a history of osteoarthritis of the right hip with pain limiting their range of motion and activities of daily living, which has failed multiple attempts at conservative therapy.  The risks and benefits of total hip arthroplasty as definitive surgical treatment were discussed with the patient, who opted to proceed with the operation.  After outpatient medical clearance and optimization was completed the patient was admitted to [facility] for the procedure.  All preoperative films were reviewed and an appropriate surgical plan was made prior to surgery.   Description of procedure: The patient was brought to the operating room where laterality was confirmed by all those present to be the right side.  The patient was administered spinal anesthesia on a stretcher prior to being moved supine on the operating room table. Patient was given an intravenous dose of antibiotics for surgical prophylaxis and TXA.  All bony prominences and extremities were well padded and the patient was securely attached to the table boots, a perineal post was placed and the patient had a safety strap placed.  Surgical site was prepped with alcohol and chlorhexidine . The surgical site over the hip was and draped  in typical sterile fashion with multiple layers of adhesive and nonadhesive drapes.  The incision site was marked out with a sterile marker and care was taken to assess the position of the ASIS and ensure appropriate position for the incision.    A surgical timeout was then called with participation of all staff in the room the patient was then a confirmed again and laterality confirmed.  Incision was made over the anterior lateral aspect of the proximal thigh in line with the TFL.  Appropriate retractors were placed and all bleeding vessels were coagulated within the subcutaneous and fatty layers.  An incision was made in the TFL fascia in the interval was carefully identified.  The lateral ascending branches of the circumflex vessels were identified, cauterized and carefully dissected. The main vessels were then tied with a 0 silk hand tie.  Retractors were placed around the superior lateral and inferior medial aspects of the femoral neck and a capsulotomy was performed exposing the hip joint.  Retraction stitches were placed and the capsulotomy to assist with visualization.  Femoral neck cut was then made and the femoral head was extracted after placing the leg in traction.  Bone wax was then applied to the proximal cut surface of the femur and water cooled bipolar electrocautery was used to address any bleeding around the femoral neck cut.  Retractors were then placed around the acetabulum to fully visualize the joint space, and the remaining labral tissue was removed and pulvinar was removed.   The acetabulum was then sequentially reamed up to the appropriate size in order to get good fit and fill for the acetabular component while under fluoroscopic guidance.  Acetabular component was then placed and  malleted into a secure fit while confirming position and abduction angle and anteversion utilizing fluoroscopy.  2 screws were then placed in the acetabular cup to assist in securing the cup in place. The cup  was irrigated,  a real neutral liner was placed, impacted, and checked for stability. The femur traction was dropped and sequentially externally rotated while performing a release of the posterior and superomedial tissues off of the proximal femur to allow for mobility, care was taken to preserve the external rotators and piriformis attachments.  The remaining interval between the abductors and the capsule was dissected out and a retractor was placed over the superolateral aspect of the femur over the greater trochanter.  The leg was carefully brought down into extension and adducted to provide visualization of the proximal femur for broaching.  The femur was then sequentially broached up to an appropriate size which provided for good fill and stability to the femoral broach.  A trial neck and head were placed on the femoral broach and the leg was brought up for reduction.  The hip was reduced and manual check of stability was performed.  The hip was found to be stable in flexion internal rotation and extension external rotation.  Leg lengths were confirmed on fluoroscopy.   The hip was then dislocated the trial neck and head were removed.  The leg was then brought down into extension and adduction in the proximal femur was reexposed.  The broach trial was removed and the femur was irrigated with normal saline prior to the real femoral stem being implanted.  After the femoral stem was seated and shown to have good fit and fill the appropriate head was impacted the leg was brought up and reduced.  There was good range of motion with stability in flexion internal rotation and extension external rotation on testing.  Leg lengths were found to be appropriate on fluoroscopic evaluation at this time.  The hip was then irrigated with betdine based surgiphor solution and then saline solution.  The capsulotomy was repaired with Ethibond sutures.  A pericapsular and peritrochanteric cocktail with Exparel and bupivacaine was  then injected as well as the subcutaneous tissues. The fascia was closed with a #1 barbed running suture.  The deep tissues were closed with Vicryl sutures the subcutaneous tissues were closed with interrupted Vicryl sutures and a running barbed 4-0 suture.  The skin was then reinforced with Dermabond and a sterile dressing was placed.   The patient was awoken from anesthesia transferred off of the operating room table onto a hospital bed where examination of leg lengths found the leg lengths to be equal with a good distal pulse.  The patient was then transferred to the PACU in stable condition.

## 2024-10-25 NOTE — Interval H&P Note (Signed)
 Patient history and physical updated. Consent reviewed including risks, benefits, and alternatives to surgery. Patient agrees with above plan to proceed with right anterior total hip arthroplasty.

## 2024-10-25 NOTE — Discharge Summary (Signed)
 Physician Discharge Summary  Patient ID: Crystal Foley MRN: [id number] DOB/AGE: [birth year] 73 y.o.  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]   Discharge Diagnoses: Patient Active Problem List   Diagnosis Date Noted   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   Recurrent syncope 02/26/2024   Periorbital hematoma of left eye 02/26/2024   Hypoxia 02/26/2024   COPD (chronic obstructive pulmonary disease) (HCC)    Hypothyroidism    UTI (urinary tract infection) 03/22/2022   Syncope 03/21/2022   Elevated lactic acid level 03/21/2022   Sepsis secondary to UTI (HCC) 03/21/2022   Aortic atherosclerosis 12/29/2018   Community acquired pneumonia 12/24/2018   Sepsis (HCC) 12/23/2018   Health care maintenance 09/19/2015   Nephrolithiasis 06/01/2015   Hypercholesterolemia without hypertriglyceridemia 09/27/2014   Pure hypercholesterolemia 09/27/2014   Adult BMI 30+ 09/25/2014   Morbid obesity (HCC) 09/25/2014   Severe obesity (BMI 35.0-35.9 with comorbidity) (HCC) 09/25/2014   Asthma 05/15/2014   Hypertension 05/15/2014   HLD (hyperlipidemia) 05/15/2014   Seizure disorder (HCC) 05/15/2014   Diabetes mellitus, type 2 (HCC) 05/15/2014   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) 05/15/2014    Past Medical History:  Diagnosis Date   Anemia affecting pregnancy    Aortic atherosclerosis    Asthma    CKD (chronic kidney disease), stage II    COPD (chronic obstructive pulmonary disease) (HCC)    DOE (dyspnea on exertion)    Electrocution 1969   junior in high school [place] a farm with a wire   Generalized convulsive epilepsy (HCC)    a.) last reported seizure was in 2010   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 female who was admitted 10/25/2024 with a diagnosis of S/P total right hip arthroplasty and went to the operating room on 10/25/2024 and underwent the above named procedures.    Surgeries: Procedure(s): ARTHROPLASTY, HIP, TOTAL, ANTERIOR APPROACH on 10/25/2024 Patient tolerated the surgery well. Taken to PACU where she was stabilized and then transferred to the orthopedic floor.  Started on Lovenox  40 mg q 24 hrs. TEDs and SCDs applied bilaterally. Heels elevated on bed. No evidence of DVT. Negative Homan. Physical therapy started on day #1 for gait training and transfer. OT started day #1 for ADL and assisted devices.  Patient's IV was d/c on day #1. Patient was able to safely and independently complete all PT goals. PT recommending discharge to home.    On post op day #1 patient was stable and ready for discharge to home with HHPT.  Implants: Cup: Trident Tritanium Clusterhole 48/D w/x 2 screws    Liner: Neutral X3 poly 36/D  Stem: Insignia #2 std offset  Head:Biolox ceramic 36 -2.5    She was given perioperative antibiotics:  Anti-infectives (From admission, onward)    Start  Dose/Rate Route Frequency Ordered Stop   10/25/24 1330  ceFAZolin (ANCEF) IVPB 2g/100 mL premix        2 g 200 mL/hr over 30 Minutes Intravenous Every 6 hours 10/25/24 1117 10/26/24 0129   10/25/24 0600  ceFAZolin (ANCEF) IVPB 2g/100 mL premix        2 g 200 mL/hr over 30 Minutes Intravenous On call to O.R. 10/24/24 2220 10/25/24 0746     .  She was given sequential compression devices, early ambulation, and Lovenox  TEDs for DVT prophylaxis.  She  benefited maximally from the hospital stay and there were no complications.    Recent vital signs:  Vitals:   10/25/24 1117 10/25/24 1252  BP: 102/64 129/63  Pulse: 80 87  Resp: 16 17  Temp:  98.1 F (36.7 C)  SpO2: 97% 100%    Recent laboratory studies:  Lab Results  Component Value Date   HGB 13.6 10/25/2024   HGB 14.8 10/15/2024   HGB 13.8 02/26/2024   Lab Results  Component Value Date   WBC 4.9 10/15/2024   PLT 237 10/15/2024   No results found for: INR Lab Results  Component Value Date   NA 136 10/25/2024   K 4.0 10/25/2024   CL 99 10/25/2024   CO2 25 10/15/2024   BUN 8 10/25/2024   CREATININE 0.80 10/25/2024   GLUCOSE 196 (H) 10/25/2024    Discharge Medications:   Allergies as of 10/25/2024       Reactions   Dilantin [phenytoin Sodium Extended] Swelling   Glipizide Other (See Comments)   diabetic coma   Metformin Diarrhea   Phenytoin Swelling        Medication List     TAKE these medications    acetaminophen  500 MG tablet Commonly known as: TYLENOL  Take 2 tablets (1,000 mg total) by mouth every 8 (eight) hours. What changed:  how much to take when to take this reasons to take this   albuterol  108 (90 Base) MCG/ACT inhaler Commonly known as: VENTOLIN  HFA Inhale 1-2 puffs into the lungs every 6 (six) hours as needed (cough/wheezing/shortness of breath).   atorvastatin  40 MG tablet Commonly known as: LIPITOR Take 40 mg by mouth in the morning.   celecoxib 200 MG capsule Commonly known as: CeleBREX Take 1 capsule (200 mg total) by mouth 2 (two) times daily for 14 days.   chlorhexidine  4 % external liquid Commonly known as: HIBICLENS Apply 15 mLs (1 Application total) topically as directed for 30 doses. Use as directed daily for 5 days every other week for 6 weeks.   clotrimazole -betamethasone  cream Commonly known as: LOTRISONE  Apply 1 Application topically 2 (two) times daily. What changed:  when to take this reasons to take  this   divalproex  250 MG 24 hr tablet Commonly known as: DEPAKOTE  ER Take 3 tablets (750 mg total) by mouth daily. Take 750 mg at 7 am every day   docusate sodium  100 MG capsule Commonly known as: COLACE Take 1 capsule (100 mg total) by mouth 2 (two) times daily.   enoxaparin  40 MG/0.4ML injection Commonly known as: LOVENOX  Inject 0.4 mLs (40 mg total) into the skin daily for 14 days. Start taking on: October 26, 2024   Jardiance 10 MG Tabs tablet Generic drug: empagliflozin Take 10 mg by mouth in the morning.   mupirocin ointment 2 % Commonly known as: BACTROBAN Place 1 Application into the nose 2 (two) times daily for 60 doses. Use as directed 2 times daily for  5 days every other week for 6 weeks.   ondansetron  4 MG tablet Commonly known as: ZOFRAN  Take 1 tablet (4 mg total) by mouth every 6 (six) hours as needed for nausea.   oxyCODONE  5 MG immediate release tablet Commonly known as: Roxicodone  Take 0.5-1 tablets (2.5-5 mg total) by mouth every 8 (eight) hours as needed for breakthrough pain.   potassium chloride  10 MEQ tablet Commonly known as: KLOR-CON  M Take 1 tablet (10 mEq) daily until surgery.  Be sure to take dose on day of surgery.  Follow-up with PCP for repeat labs.   Semaglutide 14 MG Tabs Take 14 mg by mouth in the morning.   spironolactone  25 MG tablet Commonly known as: ALDACTONE  Take 25 mg by mouth in the morning.   traMADol 50 MG tablet Commonly known as: ULTRAM Take 1 tablet (50 mg total) by mouth every 6 (six) hours as needed for moderate pain (pain score 4-6).   Trospium  Chloride 60 MG Cp24 TAKE 1 CAPSULE BY MOUTH EVERY DAY What changed: when to take this               Durable Medical Equipment  (From admission, onward)           Start     Ordered   10/25/24 1310  For home use only DME Walker rolling  Once       Question Answer Comment  Walker: With 5 Inch Wheels   Patient needs a walker to treat with the following condition  History of total hip replacement, right      10/25/24 1309   10/25/24 1310  For home use only DME 3 n 1  Once        10/25/24 1309            Diagnostic Studies: DG HIP UNILAT WITH PELVIS 2-3 VIEWS RIGHT Result Date: 10/25/2024 CLINICAL DATA:  Right hip arthroplasty. EXAM: DG HIP (WITH OR WITHOUT PELVIS) 2-3V RIGHT COMPARISON:  None Available. FINDINGS: Three fluoroscopic spot views of the pelvis and right hip obtained in the operating room. Images during hip arthroplasty. Fluoroscopy time 27 seconds. Dose 2.66 mGy. IMPRESSION: Intraoperative fluoroscopy during right hip arthroplasty. Electronically Signed   By: Andrea Gasman M.D.   On: 10/25/2024 10:50   DG C-Arm 1-60 Min-No Report Result Date: 10/25/2024 Fluoroscopy was utilized by the requesting physician.  No radiographic interpretation.   DG C-Arm 1-60 Min-No Report Result Date: 10/25/2024 Fluoroscopy was utilized by the requesting physician.  No radiographic interpretation.    Disposition:      Follow-up Information     Charlene Debby BROCKS, PA-C Follow up in 2 week(s).   Specialties: Orthopedic Surgery, Emergency Medicine Contact information: [address] [place] [address] [phone]                  Signed: Debby BROCKS Charlene 10/25/2024, 1:13 PM

## 2024-10-25 NOTE — Transfer of Care (Signed)
 Immediate Anesthesia Transfer of Care Note  Patient: Crystal Foley  Procedure(s) Performed: ARTHROPLASTY, HIP, TOTAL, ANTERIOR APPROACH (Right: Hip)  Patient Location: PACU  Anesthesia Type:MAC and Spinal  Level of Consciousness: awake, alert , and oriented  Airway & Oxygen Therapy: Patient Spontanous Breathing and Patient connected to face mask  Post-op Assessment: Report given to RN and Patient moving all extremities  Post vital signs: Reviewed and stable  Last Vitals:  Vitals Value Taken Time  BP 108/60 10/25/24 09:29  Temp 36.4 C 10/25/24 09:26  Pulse 89 10/25/24 09:30  Resp 16 10/25/24 09:30  SpO2 100 % 10/25/24 09:30  Vitals shown include unfiled device data.  Last Pain:  Vitals:   10/25/24 0929  TempSrc:   PainSc: 0-No pain         Complications: No notable events documented.

## 2024-10-25 NOTE — Progress Notes (Signed)
 Patient is not able to walk the distance required to go the bathroom, or she is unable to safely negotiate stairs required to access the bathroom.  A 3in1 BSC will alleviate this problem.       Lollie Marrow, PA-C [facility]

## 2024-10-25 NOTE — Evaluation (Addendum)
 Physical Therapy Evaluation Patient Details Name: Shawnay E Foley MRN: [id number] DOB: November 22, 1951 Today's Date: 10/25/2024  History of Present Illness  Crystal Foley is an 73 y.o. female presents for history and physical for right anterior total hip arthroplasty with Dr. Lorelle on 10/25/2024. She has had greater than 1 year of right groin lateral hip and thigh pain. Pain has been moderate to severe. She recently had a diagnostic lidocaine  bupivacaine injection with excellent results. 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 Patient's pain has interfered with her quality of life and activities daily living.  Clinical Impression  Patient noted to be in sitting position at PT arrival in room, for an initial PT evaluation due to a decline in functional status, with baseline mobility reported as modI with use of SPC, and currently requiring minA for bed mobility and limited ability to stand requiring totalA x2 for safety back to bed after attempt to initiate standing. The patient is A&O x 4, presenting with good willingness to work with PT and goals of getting better, with discharge expectations that include HHPT. The patient resides in a multi-level house and lives spouse with family/friend support. There are no steps to enter and inside the residence.  Recommended skilled PT will address safety, mobility, and discharge planning. PT recommendation to d/c patient to SNF upon medical clearance.        If plan is discharge home, recommend the following: A lot of help with walking and/or transfers;A lot of help with bathing/dressing/bathroom   Can travel by private vehicle        Equipment Recommendations Rolling walker (2 wheels)  Recommendations for Other Services       Functional Status Assessment Patient has had a recent decline in their functional status and/or demonstrates limited ability to make significant improvements in function in a reasonable and  predictable amount of time     Precautions / Restrictions Precautions Precautions: Fall Recall of Precautions/Restrictions: Intact Restrictions Weight Bearing Restrictions Per Provider Order: Yes RLE Weight Bearing Per Provider Order: Weight bearing as tolerated      Mobility  Bed Mobility Overal bed mobility: Needs Assistance Bed Mobility: Supine to Sit     Supine to sit: Min assist     General bed mobility comments: slow to move and requires light assist to move to edge of bed    Transfers Overall transfer level: Needs assistance Equipment used: Rolling walker (2 wheels) Transfers: Sit to/from Stand Sit to Stand: Min assist, Max assist, Total assist, +2 physical assistance           General transfer comment: minA initally and regressed to totalA from nurse; pt needs verbal cues to use legs to stand; observable fear of standing due to history of fall and hip pain    Ambulation/Gait               General Gait Details: deferred gait due to inability to stand at bedside  Stairs            Wheelchair Mobility     Tilt Bed    Modified Rankin (Stroke Patients Only)       Balance Overall balance assessment: Needs assistance Sitting-balance support: Feet supported Sitting balance-Leahy Scale: Good       Standing balance-Leahy Scale: Zero  Pertinent Vitals/Pain Pain Assessment Pain Assessment: 0-10 Pain Score: 7  Pain Location: R hip Pain Descriptors / Indicators: Aching Pain Intervention(s): Limited activity within patient's tolerance, Monitored during session, Premedicated before session    Home Living Family/patient expects to be discharged to:: Private residence Living Arrangements: Spouse/significant other Available Help at Discharge: Family Type of Home: Apartment         Home Layout: Multi-level;Able to live on main level with bedroom/bathroom Home Equipment: Rollator (4 wheels);Cane -  single point Additional Comments: history of fall x1    Prior Function Prior Level of Function : Independent/Modified Independent             Mobility Comments: use SPC       Extremity/Trunk Assessment        Lower Extremity Assessment Lower Extremity Assessment: Generalized weakness    Cervical / Trunk Assessment Cervical / Trunk Assessment: Normal  Communication   Communication Communication: No apparent difficulties    Cognition Arousal: Alert Behavior During Therapy: WFL for tasks assessed/performed   PT - Cognitive impairments: No apparent impairments                         Following commands: Intact       Cueing Cueing Techniques: Verbal cues     General Comments      Exercises Total Joint Exercises Ankle Circles/Pumps: AROM, Both, 10 reps Quad Sets: AROM, Both, 10 reps Gluteal Sets: AROM, Both, 10 reps Heel Slides: AROM, Both, 10 reps Hip ABduction/ADduction: AROM, Right, 5 reps Long Arc Quad: AROM, Both, 5 reps   Assessment/Plan    PT Assessment Patient needs continued PT services  PT Problem List Decreased strength;Decreased range of motion;Decreased activity tolerance;Decreased balance;Decreased mobility;Decreased safety awareness       PT Treatment Interventions Gait training;DME instruction;Functional mobility training;Therapeutic activities;Therapeutic exercise;Neuromuscular re-education;Balance training;Patient/family education;Manual techniques    PT Goals (Current goals can be found in the Care Plan section)  Acute Rehab PT Goals Patient Stated Goal: Pt wants to get better PT Goal Formulation: With patient/family Time For Goal Achievement: 11/23/24 Potential to Achieve Goals: Good    Frequency Min 2X/week     Co-evaluation               AM-PAC PT 6 Clicks Mobility  Outcome Measure Help needed turning from your back to your side while in a flat bed without using bedrails?: A Little Help needed moving from  lying on your back to sitting on the side of a flat bed without using bedrails?: A Little Help needed moving to and from a bed to a chair (including a wheelchair)?: Total Help needed standing up from a chair using your arms (e.g., wheelchair or bedside chair)?: Total Help needed to walk in hospital room?: Total Help needed climbing 3-5 steps with a railing? : Total 6 Click Score: 10    End of Session Equipment Utilized During Treatment: Gait belt Activity Tolerance: Patient tolerated treatment well Patient left: in bed;with bed alarm set;with nursing/sitter in room;with call bell/phone within reach Nurse Communication: Mobility status PT Visit Diagnosis: Other abnormalities of gait and mobility (R26.89);Muscle weakness (generalized) (M62.81);Difficulty in walking, not elsewhere classified (R26.2);Pain Pain - Right/Left: Right Pain - part of body: Hip    Time: 1340-1415 PT Time Calculation (min) (ACUTE ONLY): 35 min   Charges:   PT Evaluation $PT Eval Low Complexity: 1 Low   PT General Charges $$ ACUTE PT VISIT: 1 Visit  Sherlean Lesches DPT, PT    Sherlean DELENA Lesches 10/25/2024, 4:50 PM

## 2024-10-25 NOTE — Discharge Instructions (Signed)

## 2024-10-25 NOTE — Anesthesia Procedure Notes (Signed)
 Procedure Name: MAC Date/Time: 10/25/2024 7:26 AM  Performed by: Lorrene Camelia LABOR, CRNAPre-anesthesia Checklist: Patient identified, Emergency Drugs available, Suction available and Patient being monitored Patient Re-evaluated:Patient Re-evaluated prior to induction Oxygen Delivery Method: Simple face mask Preoxygenation: Pre-oxygenation with 100% oxygen

## 2024-10-25 NOTE — H&P (Signed)
 History of Present Illness: Crystal Foley is an 73 y.o. female presents for history and physical for right anterior total hip arthroplasty with Dr. Lorelle on 10/25/2024. She has had greater than 1 year of right groin lateral hip and thigh pain. Pain has been moderate to severe. She recently had a diagnostic lidocaine  bupivacaine injection with excellent results. 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 Patient's pain has interfered with her quality of life and activities daily living.  The patient denies fevers, chills, numbness, tingling, shortness of breath, chest pain, recent illness, or any trauma.  Past Medical History: Past Medical History:  Diagnosis Date  Asthma without status asthmaticus (HHS-HCC)  COPD (chronic obstructive pulmonary disease) (CMS/HHS-HCC) 1999  COPD (chronic obstructive pulmonary disease) (CMS/HHS-HCC) 08/23/2024  Diabetes mellitus type 2, uncomplicated (CMS/HHS-HCC)  Essential hypertension, benign  Generalized convulsive epilepsy without mention of intractable epilepsy (CMS/HHS-HCC)  Nephrolithiasis  Other convulsions (CMS/HHS-HCC)  Postmenopausal  Pure hypercholesterolemia  Unspecified hypothyroidism   Past Surgical History: Past Surgical History:  Procedure Laterality Date  TUBAL LIGATION 1984  Bladder Tack 1992  JOINT REPLACEMENT Left 2010  Shoulder  Left shoulder arthroscopy with subacromial decompression, resection of coracoacromial ligament.. Debridement of glenohumeral joint with small labral tear. Left 09/15/2009  Dr. Kathi  COLONOSCOPY 03/05/2011  Dr. MYRTIS [facility] @ [facility] - Int./Ext. Hemorrh., Diverticulosis, PHPolyps, rpt 5 yrs per [facility], ltr mailed  COLONOSCOPY 03/25/2018  non-bleeding internal hemorrhoids, procedure aborted due to poor bowel prep  COLONOSCOPY 05/13/2018  Tubulovillous adenoma/Tubular adenoma of the colon/Repeat 27yrs/TKT  Colon @ [facility] 02/25/2024  Tubular adenomas/PHx CP/REpeat 29yrs/TKT   COLONOSCOPY 08/26/2006, 02/11/2008  Dr. CHARM Punch @ [facility] - PHPolyps   Past Family History: Family History  Problem Relation Age of Onset  Emphysema Mother  Brain cancer Father  Heart disease Sister  Coronary Artery Disease (Blocked arteries around heart) Sister  Stroke Sister  Asthma Other  Epilepsy Other  Heart disease Other  cousin   Medications: Current Outpatient Medications  Medication Sig Dispense Refill  acetaminophen  (TYLENOL ) 500 MG tablet Take 500 mg by mouth as needed for Pain  albuterol  MDI, PROVENTIL , VENTOLIN , PROAIR , HFA 90 mcg/actuation inhaler 2 puffs q.i.d. p.r.n. short of breath, wheezing, or cough 1 each 2  atorvastatin  (LIPITOR) 40 MG tablet TAKE 1 TABLET BY MOUTH EVERY DAY 90 tablet 4  clotrimazole -betamethasone  (LOTRISONE ) 1-0.05 % cream Apply topically 2 (two) times daily as needed  divalproex  (DEPAKOTE  ER) 250 MG ER tablet TAKE 1 TABLET BY MOUTH 5 TIMES DAILY (Patient taking differently: Take 750 mg by mouth once daily) 450 tablet 3  JARDIANCE 10 mg tablet TAKE 1 TABLET BY MOUTH DAILY (Patient taking differently: Take 10 mg by mouth every other day) 90 tablet 3  RYBELSUS 14 mg tablet TAKE 1 TABLET BY MOUTH ONCE DAILY DO NOT CUT, CRUSH, OR CHEW 90 tablet 3  spironolactone  (ALDACTONE ) 25 MG tablet Take 1 tablet (25 mg total) by mouth once daily 30 tablet 11  trospium  (SANCTURA  XR) 60 mg XR capsule Take 60 mg by mouth once daily   No current facility-administered medications for this visit.   Allergies: Allergies  Allergen Reactions  Dilantin [Phenytoin] Swelling  Glipizide (Bulk) Other (See Comments)  Metformin Diarrhea  Phenytoin Sodium Extended Swelling    Visit Vitals: Vitals:  10/01/24 1358  BP: (!) 142/88    Review of Systems:  A comprehensive 14 point ROS was performed, reviewed, and the pertinent orthopaedic findings are documented in the HPI.  Physical Exam: Body mass index is 26.95 kg/m. General:  Well developed, well nourished,  no apparent distress, normal affect, normal gait with no antalgic component.   HEENT: Head normocephalic, atraumatic, PERRL.   Abdomen: Soft, non tender, non distended, Bowel sounds present.  Heart: Examination of the heart reveals regular, rate, and rhythm. There is no murmur noted on ascultation. There is a normal apical pulse.  Lungs: Lungs are clear to auscultation. There is no wheeze, rhonchi, or crackles. There is normal expansion of bilateral chest walls.   Right hip exam  SKIN: intact SWELLING: none WARMTH: no warmth TENDERNESS: Mildly tender to palpation over the tip of the greater trochanter reports her pain is deeper than is palpable, Stinchfield Positive ROM: 0 degrees internal rotation and Normal degrees external rotation and pain with internal rotation localized to lateral groin,; Hip Flexion 90 STRENGTH: limited by pain GAIT: stiff-legged STABILITY: stable to testing CREPITUS: no LEG LENGTH DISCREPANCY: none NEUROLOGICAL EXAM: normal VASCULAR EXAM: normal LUMBAR SPINE: tenderness: no straight leg raising sign: no motor exam: normal  The contralateral hip was examined for comparison and it showed: TENDERNESS: none ROM: normal and full STRENGTH: normal STABILITY: stable to testing  Hip Imaging :  Right hip x-rays reviewed by me today from the clinic show moderate degenerative changes with medial joint space narrowing and superior acetabular sclerosis and some cystic changes in the femoral head. There is also chronic changes over the greater trochanter with some enthesophytes. The left hip shows a similar degenerative pattern. No fractures or dislocations noted.   Narrative  This result has an attachment that is not available. CLINICAL DATA: Right hip pain for 6 months  EXAM: MR OF THE RIGHT HIP WITHOUT CONTRAST  TECHNIQUE: Multiplanar, multisequence MR imaging was performed. No intravenous contrast was administered.  COMPARISON: 08/12/2019,  11/05/2017  FINDINGS: Bones:  No hip fracture, dislocation or avascular necrosis.  No periosteal reaction or bone destruction. No aggressive osseous lesion.  Normal sacrum and sacroiliac joints. No SI joint widening or erosive changes.  Articular cartilage and labrum  Articular cartilage: Mild-moderate partial is cartilage loss of the right femoral head and acetabulum.  Labrum: Nondisplaced degenerative tearing of the right anterosuperior labrum.  Joint or bursal effusion  Joint effusion: No hip joint effusion. No SI joint effusion.  Bursae: No bursal fluid.  Muscles and tendons  Flexors: Normal.  Extensors: Normal.  Abductors: Normal.  Adductors: Normal.  Gluteals: None partial-thickness tear of the right gluteus minimus tendon insertion. Mild tendinosis of the left gluteus minimus tendon insertion.  Hamstrings: Tiny low-grade interstitial tear of the right hamstring origin.  Other findings  No pelvic free fluid. No fluid collection or hematoma. No inguinal lymphadenopathy. No inguinal hernia.  IMPRESSION: 1. Mild-moderate osteoarthritis of the right hip. 2. Nondisplaced degenerative tearing of the right anterosuperior labrum. 3. Partial-thickness tear of the right gluteus minimus tendon insertion. 4. Mild tendinosis of the left gluteus minimus tendon insertion. 5. Tiny low-grade interstitial tear of the right hamstring origin.  Electronically Signed By: Julaine Blanch M.D. On: 08/28/2024 08:52  Assessment:  Encounter Diagnosis  Name Primary?  Primary osteoarthritis of right hip Yes  Right hip gluteus minimus tear, right hip labral tear  Plan: Maribeth is a 73 year old female presents with right hip arthritis with concomitant gluteus minimus tear and right labral tear. Pain is severe, interfering with her quality of life and activities daily living. She has severe pain with right hip internal rotation. She underwent a diagnostic lidocaine /bupivacaine  intra-articular injection  of the right hip joint with 100% relief of her hip pain. Pain is interfered with quality life and activities of daily living. Risks, benefits, complications of a right anterior total hip arthroplasty have discussed with patient. Patient has agreed to send the procedure with Dr. Lorelle on 10/25/2024.  The hospitalization and post-operative care and rehabilitation were also discussed. The use of perioperative antibiotics and DVT prophylaxis were discussed. The risk, benefits and alternatives to a surgical intervention were discussed at length with the patient. The patient was also advised of risks related to the medical comorbidities and elevated body mass index (BMI). A lengthy discussion took place to review the most common complications including but not limited to: deep vein thrombosis, pulmonary embolus, heart attack, stroke, infection, wound breakdown, heterotopic ossification, dislocation, numbness, leg length in-equality, intraoperative fracture, damage to nerves, tendon,muscles, arteries or other blood vessels, death and other possible complications from anesthesia. The patient was told that we will take steps to minimize these risks by using sterile technique, antibiotics and DVT prophylaxis when appropriate and follow the patient postoperatively in the office setting to monitor progress. The possibility of recurrent pain, no improvement in pain and actual worsening of pain were also discussed with the patient. The risk of dislocation following total hip replacement was discussed and potential precautions to prevent dislocation were reviewed.    All questions answered patient agrees with above plan for right anterior total hip arthroplasty.

## 2024-10-26 ENCOUNTER — Other Ambulatory Visit: Payer: Self-pay

## 2024-10-26 ENCOUNTER — Encounter: Payer: Self-pay | Admitting: Orthopedic Surgery

## 2024-10-26 DIAGNOSIS — M1611 Unilateral primary osteoarthritis, right hip: Secondary | ICD-10-CM | POA: Diagnosis not present

## 2024-10-26 LAB — CBC
HCT: 35.2 % — ABNORMAL LOW (ref 36.0–46.0)
Hemoglobin: 12 g/dL (ref 12.0–15.0)
MCH: 31 pg (ref 26.0–34.0)
MCHC: 34.1 g/dL (ref 30.0–36.0)
MCV: 91 fL (ref 80.0–100.0)
Platelets: 138 K/uL — ABNORMAL LOW (ref 150–400)
RBC: 3.87 MIL/uL (ref 3.87–5.11)
RDW: 12.5 % (ref 11.5–15.5)
WBC: 10.4 K/uL (ref 4.0–10.5)
nRBC: 0 % (ref 0.0–0.2)

## 2024-10-26 LAB — BASIC METABOLIC PANEL WITH GFR
Anion gap: 9 (ref 5–15)
BUN: 16 mg/dL (ref 8–23)
CO2: 23 mmol/L (ref 22–32)
Calcium: 8.6 mg/dL — ABNORMAL LOW (ref 8.9–10.3)
Chloride: 101 mmol/L (ref 98–111)
Creatinine, Ser: 0.59 mg/dL (ref 0.44–1.00)
GFR, Estimated: >60 mL/min (ref >60)
Glucose, Bld: 250 mg/dL — ABNORMAL HIGH (ref 70–99)
Potassium: 4.2 mmol/L (ref 3.5–5.1)
Sodium: 133 mmol/L — ABNORMAL LOW (ref 135–145)

## 2024-10-26 LAB — GLUCOSE, CAPILLARY
Glucose-Capillary: 199 mg/dL — ABNORMAL HIGH (ref 70–99)
Glucose-Capillary: 221 mg/dL — ABNORMAL HIGH (ref 70–99)

## 2024-10-26 MED ORDER — SODIUM CHLORIDE 0.9 % IV BOLUS
500.0000 mL | Freq: Once | INTRAVENOUS | Status: AC
  Administered 2024-10-26: 500 mL via INTRAVENOUS

## 2024-10-26 NOTE — Progress Notes (Signed)
 DISCHARGE NOTE:  Pt, husband and daughter given discharge instructions and verbalized understanding. TED hose on both legs. BSC, walker and beds to meds medications sent with pt. Pt wheeled to car by staff, husband providing transportation home.

## 2024-10-26 NOTE — TOC Transition Note (Signed)
 Transition of Care [facility]) - Discharge Note   Patient Details  Name: Crystal Foley MRN: [id number] Date of Birth: [birth year]  Transition of [facility] CM/SW Contact:  Alvaro Louder, LCSW Phone Number: 10/26/2024, 12:33 PM   Clinical Narrative:   LCSWA confirmed with MD that patient is stable for discharge. LCSWA notified the patient and they are in agreement with discharge. LCSWA confirmed bed is available at [facility]. HH arranged with [facility]. Transport arranged with [facility] for next available.  TOC signing out  Final next level of care: Home w Home Health Services Barriers to Discharge: No Barriers Identified   Patient Goals and CMS Choice            Discharge Placement              Patient chooses bed at: [facility] Patient to be transferred to facility by: [facility] Name of family member notified: Self Patient and family notified of of transfer: 10/26/24  Discharge Plan and Services Additional resources added to the After Visit Summary for                            [facility] Arranged: PT, OT [facility] Agency: [facility] Date [facility] Agency Contacted: 10/26/24   Representative spoke with at [facility] Agency: [place]   Social Drivers of Health (SDOH) Interventions SDOH Screenings   Food Insecurity: No Food Insecurity (10/25/2024)  Housing: Low Risk  (10/25/2024)  Transportation Needs: No Transportation Needs (10/25/2024)  Utilities: Not At Risk (10/25/2024)  Financial Resource Strain: Low Risk  (10/01/2024)   Received from [facility]  Social Connections: Socially Integrated (10/25/2024)  Tobacco Use: Low Risk  (10/25/2024)     Readmission Risk Interventions     No data to display

## 2024-10-26 NOTE — Plan of Care (Signed)
  Problem: Activity: Goal: Risk for activity intolerance will decrease Outcome: Progressing   Problem: Elimination: Goal: Will not experience complications related to urinary retention Outcome: Progressing   Problem: Pain Managment: Goal: General experience of comfort will improve and/or be controlled Outcome: Progressing

## 2024-10-26 NOTE — Progress Notes (Signed)
   Subjective: 1 Day Post-Op Procedure(s) (LRB): ARTHROPLASTY, HIP, TOTAL, ANTERIOR APPROACH (Right) Patient reports pain as mild.   Patient is well, and has had no acute complaints or problems Denies any CP, SOB, ABD pain. We will continue therapy today.  Plan is to go Home after hospital stay.  Objective: Vital signs in last 24 hours: Temp:  [97 F (36.1 C)-98.1 F (36.7 C)] 97.8 F (36.6 C) (11/10 2345) Pulse Rate:  [79-98] 90 (11/11 0736) Resp:  [11-20] 16 (11/11 0736) BP: (102-129)/(58-75) 114/64 (11/11 0736) SpO2:  [95 %-100 %] 96 % (11/11 0736)  Intake/Output from previous day: 11/10 0701 - 11/11 0700 In: 2348.2 [I.V.:1848.2; IV Piggyback:500] Out: 100 [Blood:100] Intake/Output this shift: No intake/output data recorded.  Recent Labs    10/25/24 0706 10/26/24 0542  HGB 13.6 12.0   Recent Labs    10/25/24 0706 10/26/24 0542  WBC  --  10.4  RBC  --  3.87  HCT 40.0 35.2*  PLT  --  138*   Recent Labs    10/25/24 0706 10/26/24 0542  NA 136 133*  K 4.0 4.2  CL 99 101  CO2  --  23  BUN 8 16  CREATININE 0.80 0.59  GLUCOSE 196* 250*  CALCIUM   --  8.6*   No results for input(s): LABPT, INR in the last 72 hours.  EXAM General - Patient is Alert, Appropriate, and Oriented Extremity - Neurovascular intact Sensation intact distally Intact pulses distally Dorsiflexion/Plantar flexion intact Dressing - dressing C/D/I and no drainage Motor Function - intact, moving foot and toes well on exam.   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   Weight as of this encounter: 69.9 kg. Advance diet Up with therapy Pain well-controlled Labs are stable Vital signs stable, BP soft.  Will give 500 cc bolus of IV fluids. Care management to assist with discharge to home with home health PT today.   DVT Prophylaxis - Lovenox , TED hose, and SCDs Weight-Bearing as tolerated to right leg   T. Medford Amber, PA-C [facility] 10/26/2024, 7:52 AM

## 2024-10-26 NOTE — Progress Notes (Addendum)
 Physical Therapy Treatment Patient Details Name: Crystal Foley MRN: [id number] DOB: 12-04-1951 Today's Date: 10/26/2024   History of Present Illness Crystal Foley is an 73 y.o. female presents for history and physical for right anterior total hip arthroplasty with Dr. Lorelle on 10/25/2024. She has had greater than 1 year of right groin lateral hip and thigh pain. Pain has been moderate to severe. She recently had a diagnostic lidocaine  bupivacaine injection with excellent results. 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 Patient's pain has interfered with her quality of life and activities daily living.    PT Comments  Patient seen for PT session focused on ambulation and stair navigation. Patient required CGA for ambulation and stair way navigation and used RW. Tolerated session well with no signs of exertion or distress. Vitals remained stable during activity. Interventions aimed at improving functional mobility. Patient shows good potential to make progress with continued acute level rehab. Pt making good progress toward goals, will continue to follow POC. Discharge recommendation remains appropriate.Pt safe to d/c home with HHPT    If plan is discharge home, recommend the following: A lot of help with walking and/or transfers;A lot of help with bathing/dressing/bathroom   Can travel by private vehicle        Equipment Recommendations  Rolling walker (2 wheels)    Recommendations for Other Services       Precautions / Restrictions Precautions Precautions: Fall Recall of Precautions/Restrictions: Intact Restrictions Weight Bearing Restrictions Per Provider Order: Yes RLE Weight Bearing Per Provider Order: Weight bearing as tolerated     Mobility  Bed Mobility Overal bed mobility: Needs Assistance Bed Mobility: Supine to Sit     Supine to sit: Min assist, Contact guard     General bed mobility comments: slow to move and requires  light assist to move to edge of bed    Transfers Overall transfer level: Needs assistance Equipment used: Rolling walker (2 wheels) Transfers: Sit to/from Stand Sit to Stand: Min assist, Contact guard assist           General transfer comment: minA initally and regressed to totalA from nurse; pt needs verbal cues to use legs to stand; observable fear of standing due to history of fall and hip pain    Ambulation/Gait Ambulation/Gait assistance: Min assist, Contact guard assist Gait Distance (Feet): 250 Feet Assistive device: Rolling walker (2 wheels) Gait Pattern/deviations: Step-through pattern, Decreased step length - left       General Gait Details: light vc for RW management   Stairs Stairs: Yes Stairs assistance: Min assist Stair Management: No rails Number of Stairs: 4     Wheelchair Mobility     Tilt Bed    Modified Rankin (Stroke Patients Only)       Balance Overall balance assessment: Needs assistance Sitting-balance support: Feet supported Sitting balance-Leahy Scale: Good     Standing balance support: During functional activity, Reliant on assistive device for balance Standing balance-Leahy Scale: Good                              Communication Communication Communication: No apparent difficulties  Cognition Arousal: Alert Behavior During Therapy: WFL for tasks assessed/performed   PT - Cognitive impairments: No apparent impairments                         Following commands: Intact  Cueing Cueing Techniques: Verbal cues  Exercises      General Comments        Pertinent Vitals/Pain Pain Assessment Pain Score: 4  Pain Location: R hip Pain Descriptors / Indicators: Aching Pain Intervention(s): Limited activity within patient's tolerance, Monitored during session, Premedicated before session    Home Living                          Prior Function            PT Goals (current goals can  now be found in the care plan section) Acute Rehab PT Goals Patient Stated Goal: Pt wants to get better PT Goal Formulation: With patient/family Time For Goal Achievement: 11/23/24 Potential to Achieve Goals: Good Progress towards PT goals: Progressing toward goals    Frequency    BID      PT Plan      Co-evaluation              AM-PAC PT 6 Clicks Mobility   Outcome Measure  Help needed turning from your back to your side while in a flat bed without using bedrails?: A Little Help needed moving from lying on your back to sitting on the side of a flat bed without using bedrails?: A Little Help needed moving to and from a bed to a chair (including a wheelchair)?: A Little Help needed standing up from a chair using your arms (e.g., wheelchair or bedside chair)?: A Little Help needed to walk in hospital room?: A Little Help needed climbing 3-5 steps with a railing? : A Lot 6 Click Score: 17    End of Session Equipment Utilized During Treatment: Gait belt Activity Tolerance: Patient tolerated treatment well Patient left: in bed;with bed alarm set;with nursing/sitter in room;with call bell/phone within reach Nurse Communication: Mobility status PT Visit Diagnosis: Other abnormalities of gait and mobility (R26.89);Muscle weakness (generalized) (M62.81);Difficulty in walking, not elsewhere classified (R26.2);Pain Pain - Right/Left: Right Pain - part of body: Hip     Time: 0912-0928 PT Time Calculation (min) (ACUTE ONLY): 16 min  Charges:    $Therapeutic Activity: 8-22 mins PT General Charges $$ ACUTE PT VISIT: 1 Visit                     Sherlean Lesches DPT, PT     Sherlean A Zedric Deroy 10/26/2024, 9:30 AM

## 2024-10-26 NOTE — Plan of Care (Signed)
  Problem: Pain Managment: Goal: General experience of comfort will improve and/or be controlled Outcome: Progressing

## 2024-10-27 ENCOUNTER — Encounter: Payer: Self-pay | Admitting: Orthopedic Surgery

## 2024-10-27 NOTE — Anesthesia Postprocedure Evaluation (Signed)
 Anesthesia Post Note  Patient: Crystal Foley  Procedure(s) Performed: ARTHROPLASTY, HIP, TOTAL, ANTERIOR APPROACH (Right: Hip)  Patient location during evaluation: PACU Anesthesia Type: MAC Level of consciousness: awake and alert Pain management: pain level controlled Vital Signs Assessment: post-procedure vital signs reviewed and stable Respiratory status: spontaneous breathing, nonlabored ventilation, respiratory function stable and patient connected to nasal cannula oxygen Cardiovascular status: blood pressure returned to baseline and stable Postop Assessment: no apparent nausea or vomiting Anesthetic complications: no   No notable events documented.   Last Vitals:  Vitals:   10/26/24 0900 10/26/24 1200  BP: 134/75 124/68  Pulse: 97 89  Resp: 16 18  Temp: 36.6 C 36.9 C  SpO2: 97% 98%    Last Pain:  Vitals:   10/26/24 1200  TempSrc: Oral  PainSc: 0-No pain                 Lynwood KANDICE Clause

## 2024-10-28 ENCOUNTER — Other Ambulatory Visit: Payer: Self-pay

## 2024-11-08 NOTE — Progress Notes (Signed)
 Chief Complaint: Chief Complaint  Patient presents with  . Post Operative Visit    Right direct anterior THA - 10/25/24 - [place]    Crystal Foley is a 73 y.o. female who presents today for her first postop appointment following a right total hip arthroplasty via anterior approach performed by Dr. Lorelle on 10/25/2024.  Overall the patient feels that she is doing very well at today's visit.  She is taking Tylenol  primarily for discomfort at this time.  She has taken some tramadol  but does report some dizziness when taking her last dose of tramadol  she took at home.  She is working with [facility] physical therapy.  She denies any falls or trauma affecting the right hip since surgery.  She denies any signs of infection at home such as fevers chills or any drainage from the right hip incision site.  She is using a cane for assistance with ambulation.  She reports  a 3 out of 10 pain score.  She does have 1 more Lovenox  injection left but reports that the needle is broken on the injectable.  Past Medical History: Past Medical History:  Diagnosis Date  . Asthma without status asthmaticus (HHS-HCC)   . COPD (chronic obstructive pulmonary disease) (CMS/HHS-HCC) 1999  . COPD (chronic obstructive pulmonary disease) (CMS/HHS-HCC) 08/23/2024  . Diabetes mellitus type 2, uncomplicated (CMS/HHS-HCC)   . Essential hypertension, benign   . Generalized convulsive epilepsy without mention of intractable epilepsy (CMS/HHS-HCC)   . Nephrolithiasis   . Other convulsions (CMS/HHS-HCC)   . Postmenopausal   . Pure hypercholesterolemia   . Unspecified hypothyroidism     Past Surgical History: Past Surgical History:  Procedure Laterality Date  . TUBAL LIGATION  1984  . Bladder Tack  1992  . JOINT REPLACEMENT Left 2010   Shoulder  . Left shoulder arthroscopy with subacromial decompression, resection of coracoacromial ligament.. Debridement of glenohumeral joint with small labral tear. Left 09/15/2009   Dr.  Kathi  . COLONOSCOPY  03/05/2011   Dr. MYRTIS Piedmont @ [facility] - Int./Ext. Hemorrh., Diverticulosis, PHPolyps, rpt 5 yrs per [facility], ltr mailed  . COLONOSCOPY  03/25/2018   non-bleeding internal hemorrhoids, procedure aborted due to poor bowel prep  . COLONOSCOPY  05/13/2018   Tubulovillous adenoma/Tubular adenoma of the colon/Repeat 21yrs/TKT  . Colon @ [facility]  02/25/2024   Tubular adenomas/PHx CP/REpeat 69yrs/TKT  . ARTHROPLASTY HIP TOTAL Right 10/25/2024   Anterior THA // Dr. Lorelle  . COLONOSCOPY  08/26/2006, 02/11/2008   Dr. CHARM Punch @ [facility] - PHPolyps    Past Family History: Family History  Problem Relation Age of Onset  . Emphysema Mother   . Brain cancer Father   . Heart disease Sister   . Coronary Artery Disease (Blocked arteries around heart) Sister   . Stroke Sister   . Asthma Other   . Epilepsy Other   . Heart disease Other        cousin    Medications: Current Outpatient Medications  Medication Sig Dispense Refill  . traMADoL  (ULTRAM ) 50 mg tablet Take 50 mg by mouth    . acetaminophen  (TYLENOL ) 500 MG tablet Take 500 mg by mouth as needed for Pain    . albuterol  MDI, PROVENTIL , VENTOLIN , PROAIR , HFA 90 mcg/actuation inhaler 2 puffs q.i.d. p.r.n. short of breath, wheezing, or cough 1 each 2  . atorvastatin  (LIPITOR) 40 MG tablet TAKE 1 TABLET BY MOUTH EVERY DAY 90 tablet 4  . clotrimazole -betamethasone  (LOTRISONE ) 1-0.05 % cream Apply topically 2 (two)  times daily as needed    . divalproex  (DEPAKOTE  ER) 250 MG ER tablet TAKE 1 TABLET BY MOUTH 5 TIMES  DAILY (Patient taking differently: Take 750 mg by mouth once daily) 450 tablet 3  . JARDIANCE 10 mg tablet TAKE 1 TABLET BY MOUTH DAILY (Patient taking differently: Take 10 mg by mouth every other day) 90 tablet 3  . RYBELSUS 14 mg tablet TAKE 1 TABLET BY MOUTH ONCE  DAILY DO NOT CUT, CRUSH, OR CHEW 90 tablet 3  . spironolactone  (ALDACTONE ) 25 MG tablet TAKE 1 TABLET BY MOUTH EVERY DAY 30 tablet 5  . trospium  (SANCTURA  XR) 60 mg XR  capsule Take 60 mg by mouth once daily     No current facility-administered medications for this visit.    Allergies: Allergies  Allergen Reactions  . Dilantin [Phenytoin] Swelling  . Glipizide (Bulk) Other (See Comments)  . Metformin Diarrhea  . Phenytoin Sodium Extended Swelling     Review of Systems:  A comprehensive 14 point ROS was performed, reviewed by me today, and the pertinent orthopaedic findings are documented in the HPI.   Exam: BP (!) 150/78   Ht 162.6 cm (5' 4)   Wt 71.4 kg (157 lb 6.4 oz)   LMP  (LMP Unknown)   BMI 27.02 kg/m  General/Constitutional: The patient appears to be well-nourished, well-developed, and in no acute distress. Neuro/Psych: Normal mood and affect, oriented to person, place and time.  General: Well developed, well nourished 73 y.o. female in no apparent distress.  Normal affect.  Normal communication.  Patient answers questions appropriately.  The patient presents today using a cane for assistance, she has a minimal limp with ambulation at today's appointment.  Right Lower Extremity: Examination of the right hip reveals the incision to be intact.  The Dermabond is slowly peeling off.  There is no erythema, warmth, or drainage. The patient had no pain with hip internal and external rotation. 4/5 strength with hip abduction and adduction.  The ankle and knee had full active range of motion with no pain.  There was less than 2 second capillary refill. The patient had a negative straight leg raise.  The sensation was intact to light touch.  The patient has a negative Homan's test.  There is good skin warmth.  Imaging: None.  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  Treatment options were discussed today  with the patient. 2.  Skin examination of the right hip demonstrates a well-healing incision site without any evidence of infection.  She may continue to shower and get the incision wet at this time. 3.  Continue ambulation as tolerated, no restrictions to ambulation at this time.  Continue to avoid extreme hip extension. 4.  Transition to aspirin 81 mg twice daily for DVT prophylaxis for 3 additional weeks.  Tylenol  as needed for discomfort. 5.  The patient will follow-up with Dr.Aberman at her next scheduled postop appointment, x-rays of the right hip will be obtained at this time.  They can call the clinic they have any questions, new symptoms develop or symptoms worsen.  This office visit took 30 minutes, of which >50% involved patient counseling/education.  Review of the [place] CSRS was performed in accordance of the [facility] prior to dispensing any controlled drugs.  This note  was generated in part with voice recognition software and I apologize for any typographical errors that were not detected and corrected.  DOROTHA Gustavo Level, PA-C, CAQ-OS [facility]

## 2024-11-13 ENCOUNTER — Other Ambulatory Visit: Payer: Self-pay

## 2024-11-13 ENCOUNTER — Emergency Department

## 2024-11-13 ENCOUNTER — Encounter: Payer: Self-pay | Admitting: Emergency Medicine

## 2024-11-13 ENCOUNTER — Emergency Department
Admission: EM | Admit: 2024-11-13 | Discharge: 2024-11-13 | Disposition: A | Discharge status: Home / Self Care | Attending: Emergency Medicine | Admitting: Emergency Medicine

## 2024-11-13 DIAGNOSIS — R569 Unspecified convulsions: Secondary | ICD-10-CM | POA: Diagnosis present

## 2024-11-13 LAB — COMPREHENSIVE METABOLIC PANEL WITH GFR
ALT: 13 U/L (ref 0–44)
AST: 26 U/L (ref 15–41)
Albumin: 4.1 g/dL (ref 3.5–5.0)
Alkaline Phosphatase: 115 U/L (ref 38–126)
Anion gap: 18 — ABNORMAL HIGH (ref 5–15)
BUN: 11 mg/dL (ref 8–23)
CO2: 19 mmol/L — ABNORMAL LOW (ref 22–32)
Calcium: 9.2 mg/dL (ref 8.9–10.3)
Chloride: 99 mmol/L (ref 98–111)
Creatinine, Ser: 0.69 mg/dL (ref 0.44–1.00)
GFR, Estimated: >60 mL/min (ref >60)
Glucose, Bld: 219 mg/dL — ABNORMAL HIGH (ref 70–99)
Potassium: 4.2 mmol/L (ref 3.5–5.1)
Sodium: 136 mmol/L (ref 135–145)
Total Bilirubin: 0.5 mg/dL (ref 0.0–1.2)
Total Protein: 7.7 g/dL (ref 6.5–8.1)

## 2024-11-13 LAB — URINALYSIS, W/ REFLEX TO CULTURE (INFECTION SUSPECTED)
Bacteria, UA: NONE SEEN
Bilirubin Urine: NEGATIVE
Glucose, UA: >=500 mg/dL — AB
Hgb urine dipstick: NEGATIVE
Ketones, ur: 20 mg/dL — AB
Nitrite: NEGATIVE
Protein, ur: NEGATIVE mg/dL
Specific Gravity, Urine: 1.024 (ref 1.005–1.030)
pH: 6 (ref 5.0–8.0)

## 2024-11-13 LAB — CBC
HCT: 43 % (ref 36.0–46.0)
Hemoglobin: 14 g/dL (ref 12.0–15.0)
MCH: 30.9 pg (ref 26.0–34.0)
MCHC: 32.6 g/dL (ref 30.0–36.0)
MCV: 94.9 fL (ref 80.0–100.0)
Platelets: 334 K/uL (ref 150–400)
RBC: 4.53 MIL/uL (ref 3.87–5.11)
RDW: 14.2 % (ref 11.5–15.5)
WBC: 7.4 K/uL (ref 4.0–10.5)
nRBC: 0 % (ref 0.0–0.2)

## 2024-11-13 LAB — CK: Total CK: 63 U/L (ref 38–234)

## 2024-11-13 LAB — TROPONIN T, HIGH SENSITIVITY
Troponin T High Sensitivity: 22 ng/L — ABNORMAL HIGH (ref 0–19)
Troponin T High Sensitivity: 23 ng/L — ABNORMAL HIGH (ref 0–19)

## 2024-11-13 LAB — MAGNESIUM: Magnesium: 2 mg/dL (ref 1.7–2.4)

## 2024-11-13 LAB — VALPROIC ACID LEVEL: Valproic Acid Lvl: 34 ug/mL — ABNORMAL LOW (ref 50–100)

## 2024-11-13 MED ORDER — VALPROATE SODIUM 100 MG/ML IV SOLN
750.0000 mg | Freq: Once | INTRAVENOUS | Status: AC
  Administered 2024-11-13: 750 mg via INTRAVENOUS
  Filled 2024-11-13: qty 7.5

## 2024-11-13 MED ORDER — SODIUM CHLORIDE 0.9 % IV BOLUS
1000.0000 mL | Freq: Once | INTRAVENOUS | Status: AC
  Administered 2024-11-13: 1000 mL via INTRAVENOUS

## 2024-11-13 MED ORDER — ACETAMINOPHEN 500 MG PO TABS
1000.0000 mg | ORAL_TABLET | Freq: Once | ORAL | Status: AC
  Administered 2024-11-13: 1000 mg via ORAL
  Filled 2024-11-13: qty 2

## 2024-11-13 NOTE — Discharge Instructions (Addendum)
 You were seen in the emergency department following a breakthrough seizure.  You had a CT scan of your head that did not show any abnormalities.  Your lab work appeared to be consistent with a seizure.  You did not have any findings of a urinary tract infection.  Your Depakote  level was low today at 34.  You were given IV Depakote .  Call your primary care doctor and discuss whether you need to have a change of this dose of medication.  Avoid tramadol  as this can decrease your seizure threshold.  If your pain is so severe take an oxycodone  instead of tramadol .  Continue to take Tylenol  as needed for your pain.

## 2024-11-13 NOTE — ED Notes (Signed)
 Fall Bundle in place

## 2024-11-13 NOTE — ED Provider Notes (Signed)
 [facility] Provider Note    Event Date/Time   First MD Initiated Contact with Patient 11/13/24 [phone]     (approximate)   History   Seizures (PT to ER via EMS from home after witnessed seizure activity by Spouse - PT has a known seizure history but per Spouse hasn't had a seizure in multiple years)   HPI  Crystal Foley is a 73 y.o. female past medical history significant for seizure disorder, prior right hip replacement, presents to the emergency department following episode of seizure.  History is provided by EMS.  States the patient had witnessed seizure by her husband.  Uncertain of what it looks like her the amount of time that her seizure was occurring.  By the time they arrived patient was tired but no seizure-like activity.  Does have a history of seizures and they do not believe that she has had another seizure over the past couple of years.  Recent right hip replacement approximately 3 weeks ago and has been complaining of some mild right hip pain.  Patient does not recall the events of seizure.  Is able to state the year and where she is.  Denies any headache, change in vision or extremity numbness or weakness.  Denies any chest pain or shortness of breath.  Denies any abdominal pain, nausea or vomiting.  On review of her MAR she takes Depakote  750 mg daily for her seizure.  Currently on Lovenox  injections.  Arrives afebrile and tachycardic  Husband Dempsey arrives and states that she had a witnessed grand mal seizure.  States that she did have a tramadol  yesterday last night which she has not been taking.     Physical Exam   Triage Vital Signs: ED Triage Vitals [11/13/24 0820]  Encounter Vitals Group     BP (!) 156/79     Girls Systolic BP Percentile      Girls Diastolic BP Percentile      Boys Systolic BP Percentile      Boys Diastolic BP Percentile      Pulse Rate (!) 117     Resp 18     Temp 97.6 F (36.4 C)     Temp src      SpO2 94 %      Weight 154 lb 5.2 oz (70 kg)     Height 5' 4 (1.626 m)     Head Circumference      Peak Flow      Pain Score 0     Pain Loc      Pain Education      Exclude from Growth Chart     Most recent vital signs: Vitals:   11/13/24 1030 11/13/24 1045  BP: (!) 156/116 (!) 151/96  Pulse: (!) 117 (!) 107  Resp: (!) 21 (!) 26  Temp:    SpO2:      Physical Exam Constitutional:      Appearance: She is well-developed.  HENT:     Head: Atraumatic.  Eyes:     Extraocular Movements: Extraocular movements intact.     Conjunctiva/sclera: Conjunctivae normal.     Pupils: Pupils are equal, round, and reactive to light.  Cardiovascular:     Rate and Rhythm: Regular rhythm.  Pulmonary:     Effort: No respiratory distress.  Abdominal:     General: There is no distension.     Tenderness: There is no abdominal tenderness.  Musculoskeletal:        General:  Normal range of motion.     Cervical back: Normal range of motion. No tenderness.  Skin:    General: Skin is warm.     Capillary Refill: Capillary refill takes less than 2 seconds.  Neurological:     Mental Status: She is alert. Mental status is at baseline.     GCS: GCS eye subscore is 4. GCS verbal subscore is 5. GCS motor subscore is 6.     Cranial Nerves: Cranial nerves 2-12 are intact.     Sensory: Sensation is intact.     Motor: Motor function is intact.     Coordination: Coordination is intact.  Psychiatric:        Mood and Affect: Mood normal.     IMPRESSION / MDM / ASSESSMENT AND PLAN / ED COURSE  I reviewed the triage vital signs and the nursing notes.  Differential diagnosis including breakthrough seizure, electrolyte abnormality, intracranial hemorrhage, infectious process, dehydration, dysrhythmia, rhabdomyolysis, ACS, pulmonary embolism   EKG  I, Clotilda Punter, the attending physician, personally viewed and interpreted this ECG.  EKG showed sinus tachycardia with a heart rate of 106.  Narrow complex.  No  significant ST elevation or depression.  No findings of acute ischemia or dysrhythmia  Sinus tachycardia while on cardiac telemetry.  RADIOLOGY I independently reviewed imaging, my interpretation of imaging: CT scan of her head -no acute finding  Chest x-ray -no acute findings  LABS (all labs ordered are listed, but only abnormal results are displayed) Labs interpreted as -    Labs Reviewed  COMPREHENSIVE METABOLIC PANEL WITH GFR - Abnormal; Notable for the following components:      Result Value   CO2 19 (*)    Glucose, Bld 219 (*)    Anion gap 18 (*)    All other components within normal limits  URINALYSIS, W/ REFLEX TO CULTURE (INFECTION SUSPECTED) - Abnormal; Notable for the following components:   Color, Urine STRAW (*)    APPearance CLEAR (*)    Glucose, UA >=500 (*)    Ketones, ur 20 (*)    Leukocytes,Ua TRACE (*)    All other components within normal limits  VALPROIC ACID  LEVEL - Abnormal; Notable for the following components:   Valproic Acid  Lvl 34 (*)    All other components within normal limits  TROPONIN T, HIGH SENSITIVITY - Abnormal; Notable for the following components:   Troponin T High Sensitivity 22 (*)    All other components within normal limits  TROPONIN T, HIGH SENSITIVITY - Abnormal; Notable for the following components:   Troponin T High Sensitivity 23 (*)    All other components within normal limits  CBC  MAGNESIUM  CK     MDM   Clinical Course as of 11/13/24 1229  Sat Nov 13, 2024  9160 Attempted to call patient's husband Dempsey with no answer [SM]    Clinical Course User Index [SM] Punter Clotilda, MD   Patient's husband at bedside.  Witnessed seizure-like activity today and states that it was consistent with prior seizures.  Is now back to her baseline and acting similar to postictal episodes in the past.  No concern for meningitis, no fever and no meningismus on exam.  CT scan of the head with no acute findings.  Lab work with low  valproic acid  level of 34.  Troponin was mildly elevated but remained stable at 23.  No chest pain or shortness of breath.  No findings consistent with rhabdomyolysis.  No signs or symptoms  of pulmonary embolism, no chest pain or shortness of breath, have a very low suspicion for pulmonary embolism.  Currently on Lovenox .  Lab work with no acute elevation of creatinine.  Glucose 200.  CO2 mildly low at 19.  Does have an elevated anion gap, most consistent with having a seizure.  Tolerating p.o. with no other findings concerning for DKA or hyperosmolar syndrome.  Given IV fluids in the emergency department.  Reevaluation patient is able to tolerate p.o.  Is back to her mental status baseline with nonfocal neurologic exam.  Discussed avoiding tramadol  given that that can decrease her seizure threshold.  Also discussed following up closely with her primary care physician to discuss any change in medication dose of her valproic acid .  Discussed return precautions for any ongoing or worsening symptoms.  PROCEDURES:  Critical Care performed: No  Procedures  Patient's presentation is most consistent with acute presentation with potential threat to life or bodily function.   MEDICATIONS ORDERED IN ED: Medications  sodium chloride  0.9 % bolus 1,000 mL (0 mLs Intravenous Stopped 11/13/24 1042)  valproate (DEPACON) 750 mg in dextrose  5 % 50 mL IVPB (0 mg Intravenous Stopped 11/13/24 1154)  acetaminophen  (TYLENOL ) tablet 1,000 mg (1,000 mg Oral Given 11/13/24 1151)    FINAL CLINICAL IMPRESSION(S) / ED DIAGNOSES   Final diagnoses:  Seizure (HCC)     Rx / DC Orders   ED Discharge Orders     None        Note:  This document was prepared using Dragon voice recognition software and may include unintentional dictation errors.   Suzanne Kirsch, MD 11/13/24 1229

## 2024-11-17 ENCOUNTER — Telehealth: Payer: Self-pay | Admitting: Urology

## 2024-11-17 NOTE — Telephone Encounter (Signed)
 Please let Crystal Foley know that we wanted to check in before refilling her trospium . Since she had a seizure, it's best to wait until she sees her neurologist first. This helps make sure everything is safe and her care is coordinated.  Once she has had that appointment, just let us  know and we'll be happy to send in the refill. If she has any questions or needs anything in the meantime, please reach out--we're here to help.

## 2024-11-18 NOTE — Telephone Encounter (Signed)
 Patient had appt today everything looked good.

## 2024-11-19 ENCOUNTER — Other Ambulatory Visit: Payer: Self-pay | Admitting: Urology

## 2024-11-19 DIAGNOSIS — N3946 Mixed incontinence: Secondary | ICD-10-CM

## 2024-11-19 MED ORDER — TROSPIUM CHLORIDE ER 60 MG PO CP24: 60.0000 mg | ORAL_CAPSULE | ORAL | 3 refills | Status: AC

## 2024-12-27 ENCOUNTER — Encounter: Payer: Self-pay | Admitting: Podiatry

## 2024-12-27 ENCOUNTER — Ambulatory Visit (INDEPENDENT_AMBULATORY_CARE_PROVIDER_SITE_OTHER): Admitting: Podiatry

## 2024-12-27 DIAGNOSIS — B351 Tinea unguium: Secondary | ICD-10-CM

## 2024-12-27 DIAGNOSIS — E119 Type 2 diabetes mellitus without complications: Secondary | ICD-10-CM

## 2024-12-27 DIAGNOSIS — M79609 Pain in unspecified limb: Secondary | ICD-10-CM | POA: Diagnosis not present

## 2024-12-27 NOTE — Progress Notes (Signed)
"  °  Subjective:  Patient ID: Crystal Foley, female    DOB: [birth year],  MRN: [id number]  Carilyn FORBES Cruel presents to clinic today for preventative diabetic foot care for painful thick toenails that are difficult to trim. Pain interferes with ambulation. Aggravating factors include wearing enclosed shoe gear. Pain is relieved with periodic professional debridement. Patient is recovering from right hip surgery. Chief Complaint  Patient presents with   Nail Problem     Rfc, She saw Dr. Lenon in Oct. She denies being diabetic   New problem(s): None.   PCP is Lenon Layman ORN, MD.  Allergies[1]  Review of Systems: Negative except as noted in the HPI.  Objective: No changes noted in today's physical examination. There were no vitals filed for this visit. Crystal Foley is a pleasant 74 y.o. female WD, WN in NAD. AAO x 3.  Vascular Examination: Capillary refill time immediate b/l. Palpable pedal pulses. Pedal hair present b/l. Pedal edema absent. No pain with calf compression b/l. Skin temperature gradient WNL b/l. No cyanosis or clubbing b/l. No ischemia or gangrene noted b/l.   Neurological Examination: Sensation grossly intact b/l with 10 gram monofilament. Vibratory sensation intact b/l.   Dermatological Examination: Pedal skin with normal turgor, texture and tone b/l.  No open wounds. No interdigital macerations.   Toenails 1-5 b/l thick, discolored, elongated with subungual debris and pain on dorsal palpation.   No hyperkeratotic nor porokeratotic lesions.  Musculoskeletal Examination: Muscle strength 5/5 to all lower extremity muscle groups bilaterally. HAV with bunion deformity noted b/l LE.  Radiographs: None  Assessment/Plan: 1. Pain due to onychomycosis of nail   2. Diabetes mellitus without complication [facility])   Patient was evaluated and treated. All patient's and/or POA's questions/concerns addressed on today's visit. Mycotic toenails 1-5 b/l debrided in length and  girth without incident.  Continue daily foot inspections and monitor blood glucose per PCP/Endocrinologist's recommendations.Continue soft, supportive shoe gear daily. Report any pedal injuries to medical professional. Call office if there are any quesitons/concerns. -Patient/POA to call should there be question/concern in the interim.   Return in about 3 months (around 03/27/2025).  Delon LITTIE Merlin, DPM      [place] LOCATION: [address] [place] [address]                   Office [phone]   [place] LOCATION: [address] [place], [place] [address] Office [phone]      [1]  Allergies Allergen Reactions   Dilantin [Phenytoin Sodium Extended] Swelling   Glipizide Other (See Comments)    diabetic coma   Metformin Diarrhea   Phenytoin Swelling   "

## 2025-04-29 ENCOUNTER — Ambulatory Visit: Admitting: Podiatry

## 2025-05-05 ENCOUNTER — Ambulatory Visit: Admitting: Urology
